# Patient Record
Sex: Male | Born: 1950
Health system: Southern US, Community
[De-identification: ages and names within clinical notes are randomized; demographics above are authoritative.]

## PROBLEM LIST (undated history)

## (undated) DIAGNOSIS — I10 Essential (primary) hypertension: Secondary | ICD-10-CM

## (undated) DIAGNOSIS — K279 Peptic ulcer, site unspecified, unspecified as acute or chronic, without hemorrhage or perforation: Secondary | ICD-10-CM

## (undated) DIAGNOSIS — F1020 Alcohol dependence, uncomplicated: Secondary | ICD-10-CM

## (undated) DIAGNOSIS — N529 Male erectile dysfunction, unspecified: Secondary | ICD-10-CM

## (undated) DIAGNOSIS — F32A Depression, unspecified: Secondary | ICD-10-CM

## (undated) DIAGNOSIS — J849 Interstitial pulmonary disease, unspecified: Secondary | ICD-10-CM

## (undated) DIAGNOSIS — F329 Major depressive disorder, single episode, unspecified: Secondary | ICD-10-CM

## (undated) DIAGNOSIS — M609 Myositis, unspecified: Secondary | ICD-10-CM

## (undated) DIAGNOSIS — E785 Hyperlipidemia, unspecified: Secondary | ICD-10-CM

## (undated) DIAGNOSIS — I2510 Atherosclerotic heart disease of native coronary artery without angina pectoris: Secondary | ICD-10-CM

## (undated) DIAGNOSIS — I77810 Thoracic aortic ectasia: Secondary | ICD-10-CM

## (undated) DIAGNOSIS — I4510 Unspecified right bundle-branch block: Secondary | ICD-10-CM

## (undated) DIAGNOSIS — I499 Cardiac arrhythmia, unspecified: Secondary | ICD-10-CM

## (undated) DIAGNOSIS — F99 Mental disorder, not otherwise specified: Secondary | ICD-10-CM

## (undated) HISTORY — DX: Interstitial pulmonary disease, unspecified: J84.9

## (undated) HISTORY — PX: CORONARY STENT PLACEMENT: SHX1402

## (undated) HISTORY — DX: Thoracic aortic ectasia: I77.810

## (undated) HISTORY — DX: Peptic ulcer, site unspecified, unspecified as acute or chronic, without hemorrhage or perforation: K27.9

## (undated) HISTORY — DX: Mental disorder, not otherwise specified: F99

## (undated) HISTORY — DX: Unspecified right bundle-branch block: I45.10

## (undated) HISTORY — DX: Male erectile dysfunction, unspecified: N52.9

## (undated) HISTORY — DX: Cardiac arrhythmia, unspecified: I49.9

## (undated) HISTORY — DX: Myositis, unspecified: M60.9

---

## 2004-05-14 ENCOUNTER — Ambulatory Visit (HOSPITAL_COMMUNITY): Admission: RE | Admit: 2004-05-14 | Discharge: 2004-05-14 | Payer: Self-pay | Admitting: Gastroenterology

## 2005-05-21 ENCOUNTER — Ambulatory Visit (HOSPITAL_COMMUNITY): Admission: RE | Admit: 2005-05-21 | Discharge: 2005-05-22 | Payer: Self-pay | Admitting: Cardiology

## 2008-06-03 ENCOUNTER — Emergency Department (HOSPITAL_COMMUNITY): Admission: EM | Admit: 2008-06-03 | Discharge: 2008-06-04 | Payer: Self-pay | Admitting: Emergency Medicine

## 2008-06-08 ENCOUNTER — Ambulatory Visit: Payer: Self-pay | Admitting: Internal Medicine

## 2008-06-08 ENCOUNTER — Inpatient Hospital Stay (HOSPITAL_COMMUNITY): Admission: EM | Admit: 2008-06-08 | Discharge: 2008-06-09 | Payer: Self-pay | Admitting: Emergency Medicine

## 2010-05-28 LAB — CBC
HCT: 44.5 % (ref 39.0–52.0)
Hemoglobin: 13.8 g/dL (ref 13.0–17.0)
Hemoglobin: 15.4 g/dL (ref 13.0–17.0)
MCHC: 34.7 g/dL (ref 30.0–36.0)
MCV: 92 fL (ref 78.0–100.0)
RBC: 4.84 MIL/uL (ref 4.22–5.81)
RDW: 13.3 % (ref 11.5–15.5)
WBC: 9.7 10*3/uL (ref 4.0–10.5)

## 2010-05-28 LAB — URINALYSIS, ROUTINE W REFLEX MICROSCOPIC
Leukocytes, UA: NEGATIVE
Protein, ur: 30 mg/dL — AB
Urobilinogen, UA: 0.2 mg/dL (ref 0.0–1.0)

## 2010-05-28 LAB — COMPREHENSIVE METABOLIC PANEL
ALT: 73 U/L — ABNORMAL HIGH (ref 0–53)
AST: 70 U/L — ABNORMAL HIGH (ref 0–37)
CO2: 28 mEq/L (ref 19–32)
Calcium: 8.1 mg/dL — ABNORMAL LOW (ref 8.4–10.5)
Chloride: 105 mEq/L (ref 96–112)
Creatinine, Ser: 0.87 mg/dL (ref 0.4–1.5)
Creatinine, Ser: 0.87 mg/dL (ref 0.4–1.5)
GFR calc Af Amer: >60 mL/min (ref >60)
GFR calc Af Amer: >60 mL/min (ref >60)
GFR calc non Af Amer: >60 mL/min (ref >60)
Glucose, Bld: 129 mg/dL — ABNORMAL HIGH (ref 70–99)
Sodium: 139 mEq/L (ref 135–145)
Total Bilirubin: 1 mg/dL (ref 0.3–1.2)
Total Protein: 6.5 g/dL (ref 6.0–8.3)

## 2010-05-28 LAB — DIFFERENTIAL
Basophils Absolute: 0 10*3/uL (ref 0.0–0.1)
Eosinophils Absolute: 0 10*3/uL (ref 0.0–0.7)
Eosinophils Absolute: 0 10*3/uL (ref 0.0–0.7)
Eosinophils Relative: 1 % (ref 0–5)
Lymphocytes Relative: 16 % (ref 12–46)
Lymphocytes Relative: 22 % (ref 12–46)
Lymphs Abs: 1.4 10*3/uL (ref 0.7–4.0)
Monocytes Relative: 6 % (ref 3–12)
Neutrophils Relative %: 71 % (ref 43–77)

## 2010-05-28 LAB — PROTIME-INR
INR: 1 (ref 0.00–1.49)
Prothrombin Time: 12.6 seconds (ref 11.6–15.2)
Prothrombin Time: 13.5 seconds (ref 11.6–15.2)

## 2010-05-28 LAB — RAPID URINE DRUG SCREEN, HOSP PERFORMED
Amphetamines: NOT DETECTED
Cocaine: NOT DETECTED
Opiates: NOT DETECTED
Tetrahydrocannabinol: NOT DETECTED

## 2010-05-28 LAB — URINE MICROSCOPIC-ADD ON

## 2010-07-01 NOTE — H&P (Signed)
Sean [name], Sean [name]                  ACCOUNT NO.:  [id number]   MEDICAL RECORD NO.:  [id number]          PATIENT TYPE:  INP   LOCATION:                               FACILITY:  [facility]   PHYSICIAN:  Gordy Savers, MDDATE OF BIRTH:  [birth year]   DATE OF ADMISSION:  06/08/2008  DATE OF DISCHARGE:  06/09/2008                              HISTORY & PHYSICAL   CHIEF COMPLAINT:  Alcohol withdrawal.   HISTORY OF PRESENT ILLNESS:  The patient is a 60 year old male with a  long history of chronic alcoholism.  He was admitted to [facility]  approximately 5 years ago.  Since that time, he has had brief periods of  abstinence but more recently has begun drinking quite heavily.  The  patient presented to the emergency department with a chief complaint of  alcohol withdrawal.  The patient was noted to have significant  tachycardia with a pulse rate between 130 and 140 and was felt  appropriate to admit for medical detoxification and stabilization prior  to transfer to an alcohol rehab facility.   PAST MEDICAL HISTORY:  The patient has a long history of chronic  alcoholism.  Additionally, he has a history of hypertension and tobacco  abuse.  He states that he has been abstinent from tobacco for 30 years.  He has a history of coronary artery disease and is status post PCI of  the LAD with a bare-metal stent in April 2007.   MEDICAL REGIMEN:  1. Simvastatin 80.  2. Plavix 75 mg daily.   FAMILY HISTORY:  Father age 69 has a history of prostate cancer.  Mother  died at 55 with complications of COPD.  One brother, 4 sisters positive  for depression.   SOCIAL HISTORY:  He is married.  Two children and 2 grandchildren.  Heavy alcohol use.  Denies tobacco use for 30 years.  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   REVIEW OF SYSTEMS:  Exam is otherwise noncontributory.  The patient  complains of anxiety, tachycardia, and diaphoresis.  He was evaluated in  the emergency department 5 days ago after a  fall sustaining facial  trauma.  This required 7 sutures to the right lower chin area.   PHYSICAL EXAMINATION:  VITAL SIGNS:  Blood pressure 140/90, pulse rate  130, respiratory rate 20, temperature 97.4, O2 saturation 96%.  SKIN:  Ecchymoses about the right facial area and chin.  HEENT:  Pupil responses were normal.  Conjunctivae clear and anicteric.  Ear, nose, and throat negative.  NECK:  No bruits or adenopathy.  CHEST:  Clear.  CARDIOVASCULAR:  Rapid regular tachycardia.  No murmur appreciated.  ABDOMEN:  Soft and nontender.  No organomegaly.  EXTERNAL GENITALIA:  Normal.  EXTREMITIES:  No edema.  Peripheral pulses were full.  There is no stigmata of chronic liver disease.   LABORATORY STUDIES:  CBC revealed a normal white count and normal  hemoglobin, MCV was normal at 92.5.  INR normal 1.0.  SGOT was elevated  at 88 with an SGPT 7, albumin normal at 3.8.  Chemistries were  unremarkable except for random blood sugar of 129.  Alcohol level on 2  occasions was 289 and 148.   IMPRESSION:  1. Acute alcohol withdrawal syndrome.  2. Coronary artery disease status post percutaneous coronary      intervention of the left anterior descending.  3. Hypertension.   DISPOSITION:  The patient will be admitted to the hospital.  He will be  placed on Ativan protocol.  When medically cleared, he will be  considered for transfer to an alcohol rehab facility.      Gordy Savers, MD  Electronically Signed     PFK/MEDQ  D:  06/08/2008  T:  06/09/2008  Job:  [id number]

## 2010-07-04 NOTE — Op Note (Signed)
NAMEAERON, Sean [name]                  ACCOUNT NO.:  [id number]   MEDICAL RECORD NO.:  [id number]          PATIENT TYPE:  AMB   LOCATION:  ENDO                         FACILITY:  [facility]   PHYSICIAN:  John C. Madilyn Fireman, M.D.    DATE OF BIRTH:  Feb 16, 1951   DATE OF PROCEDURE:  05/14/2004  DATE OF DISCHARGE:                                 OPERATIVE REPORT   INDICATIONS FOR PROCEDURE:  Average risk colon cancer screening.   PROCEDURE:  The patient was placed in the left lateral decubitus position  and placed on the pulse monitor with continuous low-flow oxygen delivered by  nasal cannula. He was sedated with 75 mcg IV fentanyl and 10 mg IV Versed.  Olympus video colonoscope was inserted into the rectum and advanced to the  cecum, confirmed by transillumination of McBurney's point and visualization  of ileocecal valve and appendiceal orifice. Prep was fairly good but  somewhat suboptimal in some areas, and I could not rule out small lesions  less 1 cm in all areas. Otherwise the cecum, ascending, transverse,  descending and sigmoid colon all appeared normal with no masses, polyps,  diverticula or other mucosal abnormalities. The rectum likewise appeared  normal. Retroflexed view of the anus revealed no obvious internal  hemorrhoids. The scope was then withdrawn, and the patient returned to the  recovery room in stable condition. He tolerated the procedure well. There  were no immediate complications.   IMPRESSION:  Normal colonoscopy.   PLAN:  Colonoscopy within 10 years and consider flexible sigmoidoscopy or  Hemoccults in 5 years.      JCH/MEDQ  D:  05/14/2004  T:  05/14/2004  Job:  [id number]   cc:   Oley Balm. Georgina Pillion, M.D.  [address]  [place]  [place] [address]  Fax: [phone]

## 2010-07-04 NOTE — Cardiovascular Report (Signed)
Sean [name], Sean [name]                  ACCOUNT NO.:  [id number]   MEDICAL RECORD NO.:  [id number]          PATIENT TYPE:  OIB   LOCATION:  2899                         FACILITY:  [facility]   PHYSICIAN:  Armanda Magic, M.D.     DATE OF BIRTH:  1950/11/23   DATE OF PROCEDURE:  05/21/2005  DATE OF DISCHARGE:                              CARDIAC CATHETERIZATION   REFERRING PHYSICIAN:  Oley Balm. Georgina Pillion, M.D.   PROCEDURE:  Left heart catheterization, coronary angiography, left  ventriculography.   OPERATOR:  Armanda Magic, M.D.   INDICATIONS:  Chest pain, abnormal EKG.   COMPLICATIONS:  None.   IV ACCESS:  The right femoral artery 6-French sheath.   IV CONTRAST:  80 mL.   This is a very pleasant 60 year old white male who presented to my office  several days ago with complaints of episodic chest pain occurring over the  past 2 months, but has increased in frequency and severity recently.  He  does have a history of hypertension, tobacco abuse and alcoholism.  He now  presents for cardiac catheterization due to abnormal EKG with T-wave  inversions in the precordial leads and chest pain.   The patient is brought to the cardiac catheterization laboratory in a  fasting nonsedated state.  Informed consent was obtained.  The patient was  connected to continuous heart rate and pulse oximetry monitoring,  intermittent blood pressure monitoring.  The right groin was prepped and  draped in sterile fashion.  1% Xylocaine was used for local anesthesia.  Using modified Seldinger technique a 6-French sheath was placed in the right  femoral artery.  Under fluoroscopic guidance a 6-French JL-4 catheter was  placed in the left coronary artery.  Multiple cine films were taken at 30  degree RAO and 40 degree LAO views.  The catheter was then exchanged out  over a guidewire for 6-French JR-4 catheter which was placed under  fluoroscopic guidance in the right coronary artery.  Multiple cine films  were taken  in the 30 degree RAO and 40 degree LAO views.  This catheter was  then exchanged out over a guidewire for 6-French angled pigtail catheter  which was placed under fluoroscopic guidance in the left ventricular cavity.  Left ventriculography was performed in the 30 degree RAO view using a total  of 30 mL of contrast at 15 mL per second.  Catheter was then pulled back  across the aortic valve with no significant gradient noted.  At the end of  the procedure all catheters and sheaths were removed.  Manual compression  was performed until adequate hemostasis was obtained.  The patient was  transferred back to the his room in stable condition.   RESULTS:  1.  Left main coronary is widely patent and bifurcates into left anterior      descending artery and left circumflex artery.  2.  The left anterior descending artery has an ostial 20% narrowing and      gives rise to a large diagonal branch which is widely patent and      bifurcates distally in two  daughter branches.  Just distal to the      takeoff of the first diagonal branch there is a 30-40% narrowing of the      LAD, then there is a very large aneurysmal section.  Then there is a      concentric 95% lesion in the mid LAD.  The rest of the LAD is widely      patent.  There is sluggish TIMI II flow in the distal LAD.  3.  The left circumflex gives rise to a very small obtuse marginal one      branch which is patent.  The proximal to mid left circumflex follows in      the AV groove and has a 30% narrowing before the takeoff of a larger      second obtuse marginal branch which is widely patent.  The ongoing      circumflex gives rise to a third obtuse marginal branch which is widely      patent.  4.  The right coronary artery is widely patent throughout its course with      luminal irregularities, distally it bifurcates into posterior descending      artery and posterolateral artery, both of which are widely patent.  5.  Left  ventriculography shows normal LV systolic function.  LV pressure      116/6 mmHg, aortic pressure 114/71 mmHg.   ASSESSMENT:  1.  Angina.  2.  One-vessel obstructive coronary disease of the left anterior descending.  3.  Normal LV function.  4.  Hypertension.  5.  Tobacco and alcohol abuse.   PLAN:  PCI of the LAD per Dr. Eldridge Dace, aspirin and Plavix.  Check a fasting  lipid panel and I have discussed with the patient the need to quit smoking.      Armanda Magic, M.D.  Electronically Signed     TT/MEDQ  D:  05/21/2005  T:  05/22/2005  Job:  [id number]   cc:   Oley Balm. Georgina Pillion, M.D.  Fax: [phone]

## 2010-07-04 NOTE — Cardiovascular Report (Signed)
Sean [name], BOEHRINGER                  ACCOUNT NO.:  [id number]   MEDICAL RECORD NO.:  [id number]          PATIENT TYPE:  OIB   LOCATION:  6522                         FACILITY:  [facility]   PHYSICIAN:  Corky Crafts, MDDATE OF BIRTH:  Jun 21, 1950   DATE OF PROCEDURE:  05/21/2005  DATE OF DISCHARGE:  05/22/2005                              CARDIAC CATHETERIZATION   REFERRING PHYSICIAN:  Armanda Magic, M.D.   PROCEDURE PERFORMED:  PCI of the LAD.   OPERATOR:  Corky Crafts, M.D.   INDICATIONS:  Chest pain.   PROCEDURAL NARRATIVE:  A diagnostic catheterization was performed by Dr.  Mayford Knife which revealed a significant mid LAD lesion after a large aneurysmal  segment.  The decision was made to proceed with angioplasty.  A CLS 3.5  guiding catheter was advanced to the ascending aorta and placed in the  ostium of the left main under fluoroscopic guidance.  A Prowater wire was  advanced across the lesion.  Heparin  and Integrilin were used as  anticoagulation.  A 2.5 x 12-mm Maverick balloon was advanced to the lesion  and inflated to 10 atmospheres for 24 seconds.  A 3-0 x 15-mm Vision stent  was then advanced to the lesion.  The proximal end of the stent was placed  just at the distal edge of the aneurysm.  The stent was inflated to 12  atmospheres for 30 seconds.  The stent was then post dilated with a 3.25 x  13-mm PowerSail balloon, initially inflated to 14 atmospheres in the more  distal portion of the stent for 35 seconds, and then to 14 atmospheres for  30 seconds in the proximal edge of the stent.  There was likely some balloon  hanging out into the aneurysm outside of the stent during the second balloon  inflation.  There was an excellent angiographic result.  There is TIMI III  flow noted in the vessel.  The patient tolerated procedure well.  There were  no apparent complications.   IMPRESSION:  Successful percutaneous coronary intervention of the left  anterior descending  artery.  The initial 99% stenosis was reduced to zero  percent.  The patient had TIMI III flow at the end of the procedure   RECOMMENDATIONS:  1.  The patient will be monitored overnight in the hospital.  2.  He will o receive integument IV for 18 hours.  3.  Continue aspirin 325 mg p.o. every day and Plavix 75 mg p.o. every day      indefinitely.  4.  A bare metal stent was used because of concerns with the aneurysm.  The      aneurysm is large and the patient may eventually require Coumadin      therapy, given the sluggish flow noted within the aneurysm.  Given that      there may be some stent hanging within the aneurysm, this will likely be      better endothelialized if a bare metal stent was used.  5.  The patient will follow up with Dr. Mayford Knife.      Renelda Loma  Hoyle Barr, MD  Electronically Signed     JSV/MEDQ  D:  05/21/2005  T:  05/22/2005  Job:  [id number]

## 2011-02-27 ENCOUNTER — Encounter (INDEPENDENT_AMBULATORY_CARE_PROVIDER_SITE_OTHER): Payer: 59 | Admitting: Ophthalmology

## 2011-02-27 DIAGNOSIS — H43819 Vitreous degeneration, unspecified eye: Secondary | ICD-10-CM

## 2011-02-27 DIAGNOSIS — H4310 Vitreous hemorrhage, unspecified eye: Secondary | ICD-10-CM

## 2011-02-27 DIAGNOSIS — H2510 Age-related nuclear cataract, unspecified eye: Secondary | ICD-10-CM

## 2011-04-03 ENCOUNTER — Encounter (INDEPENDENT_AMBULATORY_CARE_PROVIDER_SITE_OTHER): Payer: 59 | Admitting: Ophthalmology

## 2011-04-03 DIAGNOSIS — H2510 Age-related nuclear cataract, unspecified eye: Secondary | ICD-10-CM

## 2011-04-03 DIAGNOSIS — H35439 Paving stone degeneration of retina, unspecified eye: Secondary | ICD-10-CM

## 2011-04-03 DIAGNOSIS — H43819 Vitreous degeneration, unspecified eye: Secondary | ICD-10-CM

## 2011-04-03 DIAGNOSIS — H4310 Vitreous hemorrhage, unspecified eye: Secondary | ICD-10-CM

## 2011-04-03 DIAGNOSIS — H3530 Unspecified macular degeneration: Secondary | ICD-10-CM

## 2011-04-29 ENCOUNTER — Emergency Department (HOSPITAL_COMMUNITY): Payer: 59

## 2011-04-29 ENCOUNTER — Inpatient Hospital Stay (HOSPITAL_COMMUNITY)
Admission: EM | Admit: 2011-04-29 | Discharge: 2011-05-05 | DRG: 897 | Disposition: A | Discharge status: Other Acute Inpatient Hospital | Payer: 59 | Attending: Internal Medicine | Admitting: Internal Medicine

## 2011-04-29 ENCOUNTER — Encounter (HOSPITAL_COMMUNITY): Payer: Self-pay

## 2011-04-29 DIAGNOSIS — F3289 Other specified depressive episodes: Secondary | ICD-10-CM | POA: Diagnosis present

## 2011-04-29 DIAGNOSIS — Z9861 Coronary angioplasty status: Secondary | ICD-10-CM

## 2011-04-29 DIAGNOSIS — I2510 Atherosclerotic heart disease of native coronary artery without angina pectoris: Secondary | ICD-10-CM | POA: Diagnosis present

## 2011-04-29 DIAGNOSIS — F10939 Alcohol use, unspecified with withdrawal, unspecified: Principal | ICD-10-CM | POA: Diagnosis present

## 2011-04-29 DIAGNOSIS — R32 Unspecified urinary incontinence: Secondary | ICD-10-CM | POA: Diagnosis present

## 2011-04-29 DIAGNOSIS — Z7982 Long term (current) use of aspirin: Secondary | ICD-10-CM

## 2011-04-29 DIAGNOSIS — R7989 Other specified abnormal findings of blood chemistry: Secondary | ICD-10-CM | POA: Diagnosis present

## 2011-04-29 DIAGNOSIS — F10239 Alcohol dependence with withdrawal, unspecified: Principal | ICD-10-CM | POA: Diagnosis present

## 2011-04-29 DIAGNOSIS — Z79899 Other long term (current) drug therapy: Secondary | ICD-10-CM

## 2011-04-29 DIAGNOSIS — I498 Other specified cardiac arrhythmias: Secondary | ICD-10-CM | POA: Diagnosis present

## 2011-04-29 DIAGNOSIS — F329 Major depressive disorder, single episode, unspecified: Secondary | ICD-10-CM | POA: Diagnosis present

## 2011-04-29 DIAGNOSIS — E785 Hyperlipidemia, unspecified: Secondary | ICD-10-CM | POA: Diagnosis present

## 2011-04-29 DIAGNOSIS — Z7902 Long term (current) use of antithrombotics/antiplatelets: Secondary | ICD-10-CM

## 2011-04-29 DIAGNOSIS — D696 Thrombocytopenia, unspecified: Secondary | ICD-10-CM | POA: Diagnosis present

## 2011-04-29 DIAGNOSIS — F1020 Alcohol dependence, uncomplicated: Secondary | ICD-10-CM | POA: Diagnosis present

## 2011-04-29 DIAGNOSIS — I10 Essential (primary) hypertension: Secondary | ICD-10-CM

## 2011-04-29 HISTORY — DX: Atherosclerotic heart disease of native coronary artery without angina pectoris: I25.10

## 2011-04-29 HISTORY — DX: Alcohol dependence, uncomplicated: F10.20

## 2011-04-29 HISTORY — DX: Essential (primary) hypertension: I10

## 2011-04-29 HISTORY — DX: Hyperlipidemia, unspecified: E78.5

## 2011-04-29 LAB — BASIC METABOLIC PANEL
CO2: 31 mEq/L (ref 19–32)
Calcium: 8.3 mg/dL — ABNORMAL LOW (ref 8.4–10.5)
Chloride: 91 mEq/L — ABNORMAL LOW (ref 96–112)
Glucose, Bld: 99 mg/dL (ref 70–99)
Potassium: 3.1 mEq/L — ABNORMAL LOW (ref 3.5–5.1)
Sodium: 134 mEq/L — ABNORMAL LOW (ref 135–145)

## 2011-04-29 LAB — URINALYSIS, ROUTINE W REFLEX MICROSCOPIC
Bilirubin Urine: NEGATIVE
Glucose, UA: NEGATIVE mg/dL
Ketones, ur: NEGATIVE mg/dL
Protein, ur: 100 mg/dL — AB
Urobilinogen, UA: 1 mg/dL (ref 0.0–1.0)

## 2011-04-29 LAB — ETHANOL: Alcohol, Ethyl (B): 113 mg/dL — ABNORMAL HIGH (ref 0–11)

## 2011-04-29 LAB — HEPATIC FUNCTION PANEL
Albumin: 2.9 g/dL — ABNORMAL LOW (ref 3.5–5.2)
Alkaline Phosphatase: 141 U/L — ABNORMAL HIGH (ref 39–117)
Total Bilirubin: 0.5 mg/dL (ref 0.3–1.2)

## 2011-04-29 LAB — DIFFERENTIAL
Eosinophils Relative: 0 % (ref 0–5)
Lymphocytes Relative: 20 % (ref 12–46)
Lymphs Abs: 0.3 10*3/uL — ABNORMAL LOW (ref 0.7–4.0)
Monocytes Absolute: 0.2 10*3/uL (ref 0.1–1.0)
Monocytes Relative: 11 % (ref 3–12)
Neutro Abs: 1 10*3/uL — ABNORMAL LOW (ref 1.7–7.7)

## 2011-04-29 LAB — RAPID URINE DRUG SCREEN, HOSP PERFORMED
Amphetamines: NOT DETECTED
Benzodiazepines: NOT DETECTED
Cocaine: NOT DETECTED
Opiates: NOT DETECTED
Tetrahydrocannabinol: NOT DETECTED

## 2011-04-29 LAB — URINE MICROSCOPIC-ADD ON

## 2011-04-29 LAB — CBC
HCT: 40 % (ref 39.0–52.0)
Hemoglobin: 13.8 g/dL (ref 13.0–17.0)
MCV: 93.2 fL (ref 78.0–100.0)
RBC: 4.29 MIL/uL (ref 4.22–5.81)
RDW: 13.5 % (ref 11.5–15.5)
WBC: 1.5 10*3/uL — ABNORMAL LOW (ref 4.0–10.5)

## 2011-04-29 LAB — MAGNESIUM: Magnesium: 2.1 mg/dL (ref 1.5–2.5)

## 2011-04-29 MED ORDER — LORAZEPAM 1 MG PO TABS
1.0000 mg | ORAL_TABLET | Freq: Four times a day (QID) | ORAL | Status: DC | PRN
  Filled 2011-04-29 (×2): qty 1

## 2011-04-29 MED ORDER — ENOXAPARIN SODIUM 30 MG/0.3ML ~~LOC~~ SOLN
30.0000 mg | SUBCUTANEOUS | Status: DC
  Administered 2011-04-29 – 2011-05-04 (×6): 30 mg via SUBCUTANEOUS
  Filled 2011-04-29 (×7): qty 0.3

## 2011-04-29 MED ORDER — ALUM & MAG HYDROXIDE-SIMETH 200-200-20 MG/5ML PO SUSP: 30.0000 mL | ORAL | Status: DC | PRN

## 2011-04-29 MED ORDER — PANTOPRAZOLE SODIUM 40 MG PO TBEC
40.0000 mg | DELAYED_RELEASE_TABLET | Freq: Every day | ORAL | Status: DC
  Administered 2011-04-29 – 2011-05-05 (×7): 40 mg via ORAL
  Filled 2011-04-29 (×8): qty 1

## 2011-04-29 MED ORDER — THIAMINE HCL 100 MG/ML IJ SOLN: 100.0000 mg | Freq: Every day | INTRAMUSCULAR | Status: DC

## 2011-04-29 MED ORDER — LORAZEPAM 0.5 MG PO TABS
0.0000 mg | ORAL_TABLET | Freq: Four times a day (QID) | ORAL | Status: AC
  Administered 2011-04-29 – 2011-05-01 (×6): 1 mg via ORAL
  Filled 2011-04-29 (×7): qty 2

## 2011-04-29 MED ORDER — VITAMIN B-1 100 MG PO TABS
100.0000 mg | ORAL_TABLET | Freq: Every day | ORAL | Status: DC
  Administered 2011-04-29 – 2011-05-05 (×7): 100 mg via ORAL
  Filled 2011-04-29 (×7): qty 1

## 2011-04-29 MED ORDER — POTASSIUM CHLORIDE CRYS ER 20 MEQ PO TBCR
40.0000 meq | EXTENDED_RELEASE_TABLET | Freq: Once | ORAL | Status: AC
  Administered 2011-04-29: 40 meq via ORAL
  Filled 2011-04-29: qty 2

## 2011-04-29 MED ORDER — ACETAMINOPHEN 325 MG PO TABS
650.0000 mg | ORAL_TABLET | Freq: Once | ORAL | Status: DC
  Filled 2011-04-29: qty 2

## 2011-04-29 MED ORDER — LORAZEPAM 0.5 MG PO TABS
1.0000 mg | ORAL_TABLET | Freq: Four times a day (QID) | ORAL | Status: AC | PRN
  Administered 2011-04-30 – 2011-05-02 (×7): 1 mg via ORAL
  Filled 2011-04-29 (×7): qty 2

## 2011-04-29 MED ORDER — LORAZEPAM 0.5 MG PO TABS: 1.0000 mg | ORAL_TABLET | Freq: Four times a day (QID) | ORAL | Status: DC

## 2011-04-29 MED ORDER — ONDANSETRON HCL 4 MG PO TABS
4.0000 mg | ORAL_TABLET | Freq: Three times a day (TID) | ORAL | Status: DC | PRN
  Administered 2011-04-29: 10:00:00 via ORAL
  Filled 2011-04-29: qty 1

## 2011-04-29 MED ORDER — CLOPIDOGREL BISULFATE 75 MG PO TABS
75.0000 mg | ORAL_TABLET | Freq: Every day | ORAL | Status: DC
  Administered 2011-04-29 – 2011-05-05 (×7): 75 mg via ORAL
  Filled 2011-04-29 (×7): qty 1

## 2011-04-29 MED ORDER — LORAZEPAM 1 MG PO TABS
2.0000 mg | ORAL_TABLET | Freq: Once | ORAL | Status: AC
  Administered 2011-04-29: 2 mg via ORAL

## 2011-04-29 MED ORDER — SODIUM CHLORIDE 0.9 % IV BOLUS (SEPSIS)
1000.0000 mL | Freq: Once | INTRAVENOUS | Status: AC
  Administered 2011-04-29: 1000 mL via INTRAVENOUS

## 2011-04-29 MED ORDER — VITAMIN B-1 100 MG PO TABS
100.0000 mg | ORAL_TABLET | Freq: Every day | ORAL | Status: DC
  Administered 2011-04-29: 100 mg via ORAL
  Filled 2011-04-29 (×2): qty 1

## 2011-04-29 MED ORDER — LORAZEPAM 0.5 MG PO TABS
0.0000 mg | ORAL_TABLET | Freq: Two times a day (BID) | ORAL | Status: AC
  Administered 2011-05-01 – 2011-05-03 (×4): 1 mg via ORAL
  Filled 2011-04-29 (×3): qty 2

## 2011-04-29 MED ORDER — LORAZEPAM 2 MG/ML IJ SOLN: 2.0000 mg | Freq: Once | INTRAMUSCULAR | Status: AC

## 2011-04-29 MED ORDER — IBUPROFEN 600 MG PO TABS
600.0000 mg | ORAL_TABLET | Freq: Three times a day (TID) | ORAL | Status: DC | PRN
  Administered 2011-04-29: 600 mg via ORAL
  Filled 2011-04-29: qty 1
  Filled 2011-04-29: qty 3

## 2011-04-29 MED ORDER — LORAZEPAM 2 MG/ML IJ SOLN: 1.0000 mg | Freq: Four times a day (QID) | INTRAMUSCULAR | Status: DC | PRN

## 2011-04-29 MED ORDER — ATORVASTATIN CALCIUM 40 MG PO TABS
40.0000 mg | ORAL_TABLET | Freq: Every day | ORAL | Status: DC
  Administered 2011-04-29 – 2011-05-05 (×7): 40 mg via ORAL
  Filled 2011-04-29 (×7): qty 1

## 2011-04-29 MED ORDER — ADULT MULTIVITAMIN W/MINERALS CH
1.0000 | ORAL_TABLET | Freq: Every day | ORAL | Status: DC
  Administered 2011-04-29 – 2011-05-05 (×7): 1 via ORAL
  Filled 2011-04-29 (×7): qty 1

## 2011-04-29 MED ORDER — ASPIRIN EC 325 MG PO TBEC
325.0000 mg | DELAYED_RELEASE_TABLET | Freq: Every day | ORAL | Status: DC
Start: 2011-04-29 — End: 2011-05-01
  Administered 2011-04-29 – 2011-04-30 (×2): 325 mg via ORAL
  Filled 2011-04-29 (×3): qty 1

## 2011-04-29 MED ORDER — MORPHINE SULFATE 2 MG/ML IJ SOLN: 2.0000 mg | INTRAMUSCULAR | Status: DC | PRN

## 2011-04-29 MED ORDER — SODIUM CHLORIDE 0.9 % IV SOLN
INTRAVENOUS | Status: DC
  Administered 2011-04-29 – 2011-04-30 (×2): via INTRAVENOUS

## 2011-04-29 MED ORDER — THIAMINE HCL 100 MG/ML IJ SOLN
100.0000 mg | Freq: Every day | INTRAMUSCULAR | Status: DC
  Filled 2011-04-29 (×7): qty 1

## 2011-04-29 MED ORDER — ADULT MULTIVITAMIN W/MINERALS CH
1.0000 | ORAL_TABLET | Freq: Every day | ORAL | Status: DC
  Administered 2011-04-29: 1 via ORAL
  Filled 2011-04-29 (×2): qty 1

## 2011-04-29 MED ORDER — FOLIC ACID 1 MG PO TABS: 1.0000 mg | ORAL_TABLET | Freq: Every day | ORAL | Status: DC

## 2011-04-29 MED ORDER — LORAZEPAM 2 MG/ML IJ SOLN: 1.0000 mg | Freq: Four times a day (QID) | INTRAMUSCULAR | Status: AC | PRN

## 2011-04-29 MED ORDER — LORAZEPAM 0.5 MG PO TABS
2.0000 mg | ORAL_TABLET | Freq: Four times a day (QID) | ORAL | Status: DC | PRN
  Filled 2011-04-29: qty 1

## 2011-04-29 MED ORDER — FOLIC ACID 1 MG PO TABS
1.0000 mg | ORAL_TABLET | Freq: Every day | ORAL | Status: DC
  Administered 2011-04-29 – 2011-05-05 (×7): 1 mg via ORAL
  Filled 2011-04-29 (×8): qty 1

## 2011-04-29 NOTE — ED Notes (Signed)
[id number] Ready 

## 2011-04-29 NOTE — H&P (Signed)
Hospital Admission Note Date: 04/29/2011  Patient name: Sean [name] Medical record number: [id number] Date of birth: [birth year] Age: 61 y.o. Gender: male PCP: None   Medical Service:  Mervyn Gay  Attending physician:  Dr. Lina Sayre   1st Contact:   Dr. [name]  Pager:  [phone] 2nd Contact:   Dr. Allena Katz  Pager:  [phone] After 5 pm or weekends: 1st Contact:      Pager: [phone] 2nd Contact:      Pager: [phone]  Chief Complaint: Alcohol Withdrawal / Detox  History of Present Illness:  Sean [name] is a 61 yo man with history of heavy alcohol use for years, HTN, HLD, and CAD s/p PCI placement in 2007 who presents for alcohol detox and alcohol withdrawal.  Sean [name] has been drinking one bottle of vodka per day or 18-24 beers per day for the past two months.  His last drink was at midnight last night.  Sean [name] reports sweats, anxiety, and shakes now.    Sean [name] has detoxed in the past, including one time years ago when Sean [name] went to [facility] in [place].  Sean [name] has since quit drinking a couple times and relapsed.  Sean [name] usually goes to [facility] and has AA friends but Sean [name] has not gone in two months.  Sean [name] has gone into withdrawal "countless times" before, but Sean [name] has never had seizures.  No history of GI bleeds.  No known history of liver disease.    His diet has been poor these last two months as most of his calories have come from liquor/beer.  Sean [name] has had a lot of diarrhea over this time, 3-4 episodes per day.    No CP, SOB, urinary symptoms, nausea, vomiting, abdominal pain, or numbness/tingling anywhere.    Meds: Medication List  As of 04/29/2011  4:06 PM   ASK your doctor about these medications         aspirin 325 MG tablet   Take 81 mg by mouth daily.      clopidogrel 75 MG tablet   Commonly known as: PLAVIX   Take 75 mg by mouth daily.      mulitivitamin with minerals Tabs   Take 1 tablet by mouth daily.      simvastatin 80 MG tablet   Commonly known as: ZOCOR   Take 80 mg by mouth  daily.      vitamin C 500 MG tablet   Commonly known as: ASCORBIC ACID   Take 500 mg by mouth daily.            Allergies: Review of patient's allergies indicates no known allergies. Past Medical History  Diagnosis Date  . Coronary artery disease     95% mid LAD s/p BMS of LAD, other non critical obstruction, put on plavix and aspirin 325  . HTN (hypertension)   . HLD (hyperlipidemia)   . Alcohol addiction     2010- admitted for withdrawal , detox admission after that   Past Surgical History  Procedure Date  . Coronary stent placement    No family history on file. Mother drank alcohol heavily.  Father was a social drinker.  Daughter Sean [name] says is borderline trouble drinker.   History   Social History  . Marital Status: Married    Spouse Name: N/A    Number of Children: N/A  . Years of Education: N/A   Occupational History  . Not on file.   Social History Main Topics  . Smoking status: Never Smoker   .  Smokeless tobacco: Not on file  . Alcohol Use: Yes  . Drug Use: No  . Sexually Active:    Other Topics Concern  . Not on file   Social History Narrative   Live with wife in [place], is an Art gallery manager, currently driving trucks for a local firm. Quit smoking 1990 after smoking for 20 years. Drinks heavilyu (1 bottle of vodka or 18-24 beers a day). Used to smoke marijuana but quit in 1990s. Has united healthcare insurance. Goes to [facility] for alcohol cessation  In process of separation from his wife Drives trucks, used to be an Art gallery manager for [employer].  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  Used to smoke marijuana a lot many years ago.  No drug use currently.  Smoked for 20 years until quitting in 1990.    Review of Systems: See HPI  Physical Exam: Blood pressure 165/96, pulse 118, temperature 99.2 F (37.3 C), temperature source Oral, resp. rate 20, height 5\' 10"  (1.778 m), weight 190 lb (86.183 kg), SpO2  96.00%.   General: alert, well-developed, and cooperative to examination. Anxious. Head: normocephalic and atraumatic.  Eyes: vision grossly intact, pupils equal, pupils round, pupils reactive to light, no injection and anicteric.  Mouth: pharynx pink and moist, no erythema, and no exudates.  Neck: no JVD, and no carotid bruits.  Lungs: normal respiratory effort, no accessory muscle use, normal breath sounds, no crackles, and no wheezes. Heart: normal rate, regular rhythm, no murmur, no gallop, and no rub.  Abdomen: soft, non-tender, normal bowel sounds, no distention, no guarding, no rebound tenderness, no hepatomegaly Msk: no joint swelling, no joint warmth, and no redness over joints.  Pulses: 2+ DP/PT pulses bilaterally Extremities: No cyanosis, clubbing, edema  Neurologic: alert & oriented X3, cranial nerves II-XII intact, strength normal in all extremities, sensation intact to light touch.  Action tremor in all 4 extremities and tongue.    Skin: turgor normal and no rashes.        Lab results: Basic Metabolic Panel:  [facility] 04/29/11 0856  NA 134*  K 3.1*  CL 91*  CO2 31  GLUCOSE 99  BUN 10  CREATININE 0.96  CALCIUM 8.3*  MG --  PHOS --   CBC:  Basename 04/29/11 0856  WBC 1.5*  NEUTROABS 1.0*  HGB 13.8  HCT 40.0  MCV 93.2  PLT 88*   Urine Drug Screen: Drugs of Abuse     Component Value Date/Time   LABOPIA NONE DETECTED 04/29/2011 0901   COCAINSCRNUR NONE DETECTED 04/29/2011 0901   LABBENZ NONE DETECTED 04/29/2011 0901   AMPHETMU NONE DETECTED 04/29/2011 0901   THCU NONE DETECTED 04/29/2011 0901   LABBARB NONE DETECTED 04/29/2011 0901    Alcohol Level:  Basename 04/29/11 0856  ETH 113*   Urinalysis:  Basename 04/29/11 0901  COLORURINE YELLOW  LABSPEC 1.010  PHURINE 7.0  GLUCOSEU NEGATIVE  HGBUR MODERATE*  BILIRUBINUR NEGATIVE  KETONESUR NEGATIVE  PROTEINUR 100*  UROBILINOGEN 1.0  NITRITE NEGATIVE  LEUKOCYTESUR NEGATIVE    Imaging  results:  Dg Chest 2 View  04/29/2011  *RADIOLOGY REPORT*  Clinical Data: Short of breath and weakness.  CHEST - 2 VIEW  Comparison: None.  Findings: Heart size is normal.  Mediastinal shadows are normal. The vascularity is normal.  Lungs are clear.  No effusions. Ordinary degenerative changes effect the spine.  IMPRESSION: No active cardiopulmonary disease.  Original Report Authenticated By: Thomasenia Sales, M.D.    Other  results: EKG: pending  Assessment & Plan by Problem: Active Problems:  Alcohol withdrawal: Tremulous, tachycardic, hypertensive, all likely due to his alcohol withdrawal.  Has been drinking very heavily for the past two months.  Will admit him to stabilize his withdrawal and attempt to find a detox facility for disposition.    -Ativan: CIWA Protocol plus scheduled Ativan 1mg  PO Q6H -Thiamine and folic acid already given in ED, continue these daily -Hepatic Function Panel, Magnesium, Phosphorus -Zofran PRN -Protonix scheduled, Maalox PRN for heartburn -Morphine PRN for pain   CAD, History of PCI: 2007 Cath showed 95% mid LAD stenosis, stented with baremetal stent has been on Plavix since then.  Also taking "1/3 of a full dose aspirin" at home.  Simvastatin at home as well.  12 lead EKG to make sure Sean [name] does not have demand ischemia in the midst of his withdrawal ASA 81mg  daily Continue Plavix daily Contiue Statin   HTN (hypertension): On no medication as outpatient.  BPs 160s/90s-100s here, likely due to alcohol withdrawal.  Will treat withdrawal with ativan and see how BP responds before starting any medication for BP.  Clonidine would be a good choice in the acute withdrawal setting if pressures get too high.   HLD (hyperlipidemia): Lipid panel.  Will continue statin.  DVT Prophylaxis: Lovenox   SignedYaakov Guthrie, [name] 04/29/2011, 3:55 PM

## 2011-04-29 NOTE — ED Notes (Signed)
Informed patient and/or family of status.  

## 2011-04-29 NOTE — ED Notes (Signed)
Pt reports has been battling alcoholism for many years. Last went to [facility] 8-9 yrs ago. Has relapsed many times & has never been sober for longer than 2 yrs. States started back drinking last summer but "had it under control". States past 2 months has not been working & drinking has increased. Drinks average 12 beers & bottle of wine daily or will drink a fifth vodka daily. Last drink yesterday at 2100. Denies any other drug use. States his wife "has had it with him. I've put her through so much over the years. She has told me to get out. She's started the process for a legal separation". Reports has been "fantasizing" about suicide & how he would do it. But denies that he would ever act on it. Pt presently calm & cooperative

## 2011-04-29 NOTE — ED Notes (Signed)
Patient transported to X-ray 

## 2011-04-29 NOTE — ED Notes (Signed)
Dinner tray delivered.

## 2011-04-29 NOTE — BH Assessment (Signed)
Assessment Note   Sean [name] is an 61 y.o. male that presented to [facility] ED for detox at the request of a walk-in clinic he visited for Librium.  Pt reports drinking up to 1/5th of Vodka, or an 18 pack of beer and 3-4 bottles of wine QD for two months since being out of work for "eye problems" of which he has recently been cleared.  Pt admits "just sitting home for two months and drinking all day every day."  Pt denies any additional use.  Pt has had up to two years of sobriety in 2004 and several months in 2010 plus AA support.  Pt denies current care by MD, or any psychiatric history, other than the inpatient treatment history.  Pt is tearful and also voices passive SI, "I just want all of this to be over with."  Pt's wife of 40 years will leave him if he does not quit and he will lose his job, as well.  Pt voices current tremors, shakiness, numbness and headache, nausea, with a history of DT's hopelessness and helplessness.  Pt voices willingness and readiness to "do anything to change."  Pt will be given Ativan in ED to treat current withdrawals.  Please run for possible inpatient treatment of SA.    Axis I: Alcohol Dependence Axis II: Deferred Axis III:  Past Medical History  Diagnosis Date  . Coronary artery disease    Axis IV: other psychosocial or environmental problems and problems related to social environment Axis V: 21-30 behavior considerably influenced by delusions or hallucinations OR serious impairment in judgment, communication OR inability to function in almost all areas  Past Medical History:  Past Medical History  Diagnosis Date  . Coronary artery disease     Past Surgical History  Procedure Date  . Coronary stent placement     Family History: No family history on file.  Social History:  reports that he has never smoked. He does not have any smokeless tobacco history on file. He reports that he drinks alcohol. He reports that he does not use illicit  drugs.  Additional Social History:  Alcohol / Drug Use Pain Medications: no Prescriptions: no Over the Counter: no History of alcohol / drug use?: Yes Longest period of sobriety (when/how long): 2 years Negative Consequences of Use: Financial;Personal relationships;Work / School Withdrawal Symptoms: Fever / Chills;Sweats;Tremors;Patient aware of relationship between substance abuse and physical/medical complications;Tingling Substance #1 Name of Substance 1: ETOH 1 - Age of First Use: 14 1 - Amount (size/oz): up to 1/5th of Vodka, 18 pack of beer and 3-4 bottles of wine QD 1 - Frequency: QD 1 - Duration: 2 months 1 - Last Use / Amount: last night 3/12 Allergies: No Known Allergies  Home Medications:  Medications Prior to Admission  Medication Dose Route Frequency Provider Last Rate Last Dose  . alum & mag hydroxide-simeth (MAALOX/MYLANTA) 200-200-20 MG/5ML suspension 30 mL  30 mL Oral PRN Otilio Miu, PA      . folic acid (FOLVITE) tablet 1 mg  1 mg Oral Daily Catherine E Schinlever, PA      . ibuprofen (ADVIL,MOTRIN) tablet 600 mg  600 mg Oral Q8H PRN Otilio Miu, PA      . LORazepam (ATIVAN) tablet 1 mg  1 mg Oral Q6H PRN Otilio Miu, PA       Or  . LORazepam (ATIVAN) injection 1 mg  1 mg Intravenous Q6H PRN Otilio Miu, PA      .  mulitivitamin with minerals tablet 1 tablet  1 tablet Oral Daily Arie Sabina Schinlever, PA      . ondansetron (ZOFRAN) tablet 4 mg  4 mg Oral Q8H PRN Otilio Miu, PA      . thiamine (VITAMIN B-1) tablet 100 mg  100 mg Oral Daily Otilio Miu, PA       Or  . thiamine (B-1) injection 100 mg  100 mg Intravenous Daily Otilio Miu, PA       No current outpatient prescriptions on file as of 04/29/2011.    OB/GYN Status:  No LMP for male patient.  General Assessment Data Location of Assessment: [facility] ED Living Arrangements: Spouse/significant other Can pt return to current living  arrangement?: Yes Admission Status: Voluntary Is patient capable of signing voluntary admission?: Yes Transfer from: Acute Hospital Referral Source: MD  Education Status Is patient currently in school?: No  Risk to self Suicidal Ideation: Yes-Currently Present Suicidal Intent: No Is patient at risk for suicide?: Yes Suicidal Plan?: No Access to Means: Yes Specify Access to Suicidal Means: alcohol and pills available What has been your use of drugs/alcohol within the last 12 months?: vodka, wine, and beer Previous Attempts/Gestures: No How many times?: 0  Other Self Harm Risks: binging Triggers for Past Attempts: None known Intentional Self Injurious Behavior: Damaging Comment - Self Injurious Behavior: binging Family Suicide History: No Recent stressful life event(s): Loss (Comment);Recent negative physical changes;Turmoil (Comment) Persecutory voices/beliefs?: No Depression: Yes Depression Symptoms: Despondent;Fatigue;Guilt;Loss of interest in usual pleasures;Feeling worthless/self pity Substance abuse history and/or treatment for substance abuse?: Yes Suicide prevention information given to non-admitted patients: Not applicable  Risk to Others Homicidal Ideation: No Thoughts of Harm to Others: No Current Homicidal Intent: No Current Homicidal Plan: No Access to Homicidal Means: No Identified Victim: n/a History of harm to others?: No Assessment of Violence: None Noted Does patient have access to weapons?: No Criminal Charges Pending?: No Does patient have a court date: No  Psychosis Hallucinations: None noted Delusions: None noted  Mental Status Report Eye Contact: Good Motor Activity: Tremors Speech: Logical/coherent Level of Consciousness: Quiet/awake Mood: Depressed;Anxious;Helpless;Guilty;Sullen;Worthless, low self-esteem Affect: Anxious;Depressed;Sullen Anxiety Level: Moderate Thought Processes: Relevant Judgement: Impaired Orientation:  Person;Place;Time;Situation Obsessive Compulsive Thoughts/Behaviors: Severe  Cognitive Functioning Concentration: Decreased Memory: Recent Impaired;Remote Impaired IQ: Above Average Insight: Poor Impulse Control: Poor Appetite: Poor Weight Loss: 5  Weight Gain: 0  Sleep: No Change Total Hours of Sleep: 8  Vegetative Symptoms: None  Prior Inpatient Therapy Prior Inpatient Therapy: Yes Prior Therapy Dates: 2010 and 2004 Prior [facility] Facilty[facility]Provider([facility] Reason for Treatment: detox and SA treatment  Prior Outpatient Therapy Prior Outpatient Therapy: Yes Prior Therapy Dates: 2010 and 2004 and ongoing  [facility] Reason for Treatment: SA          Abuse/Neglect Assessment (Assessment to be complete while patient is alone) Physical Abuse: Denies Verbal Abuse: Denies Sexual Abuse: Denies Exploitation of patient/patient's resources: Denies Self-Neglect: Yes, past (Comment) (I don't even eat anymore.  I just drink all day long.) Values / Beliefs Cultural Requests During Hospitalization: None Spiritual Requests During Hospitalization: None   Advance Directives (For Healthcare) Advance Directive: Patient does not have advance directive    Additional Information 1:1 In Past 12 Months?: No CIRT Risk: No Elopement Risk: No Does patient have medical clearance?: Yes     Disposition:  Disposition Disposition of Patient: Referred to [facility])  On Site Evaluation by:   Reviewed with  Physician:     Angelica Ran 04/29/2011 9:28 AM

## 2011-04-29 NOTE — ED Notes (Signed)
Pt presents for need of detox from ETOH.  Pt reports drinking heavily x 2-3 months, last drank last night.  Pt reports he normally drinks approximately 1 bottle of wine and 12 pack of beer daily.  Pt denies any other drugs.  Pt admits to recent suicidal ideations with no plan.

## 2011-04-29 NOTE — Progress Notes (Signed)
Patient arrive to unit and it was left unclear if patient was currently having suicidal ideations. MD notified. MD assessed patient and stated that he does not need any additional orders and that his not currently having any suicidal thoughts at this time. Will continue to monitor resident and report off to next shift nurse.

## 2011-04-29 NOTE — ED Notes (Signed)
Dinner tray ordered Regular non-sharp. 

## 2011-04-29 NOTE — ED Provider Notes (Signed)
History     CSN: [id number]  Arrival date & time 04/29/11  4098   First MD Initiated Contact with Patient 04/29/11 0732      Chief Complaint  Patient presents with  . Psychiatric Evaluation    (Consider location/radiation/quality/duration/timing/severity/associated sxs/prior treatment) HPI History provided by pt.   Pt presents w/ request for alcohol detox.  Drinks either a fifth of vodka or an 18 pack of beer on a typical day.  Most recent drink 12 hours ago.  Currently experiencing mild tremors.  No h/o DTs.  Has been through detox twice in the past and the first time was successful for approx 2 years.  Does not abuse any other substances.  Has had thoughts of killing himself that he attributes to the alcohol.  He is going through a divorce and his wife has a $100,000 Brewing technologist on him.  No plan and states that he would never act on these thoughts.  Does not own a gun.    Past Medical History  Diagnosis Date  . Coronary artery disease     Past Surgical History  Procedure Date  . Coronary stent placement     No family history on file.  History  Substance Use Topics  . Smoking status: Never Smoker   . Smokeless tobacco: Not on file  . Alcohol Use: Yes      Review of Systems  All other systems reviewed and are negative.    Allergies  Review of patient's allergies indicates no known allergies.  Home Medications   Current Outpatient Rx  Name Route Sig Dispense Refill  . ASPIRIN 325 MG PO TABS Oral Take 81 mg by mouth daily.    Marland Kitchen CLOPIDOGREL BISULFATE 75 MG PO TABS Oral Take 75 mg by mouth daily.    . ADULT MULTIVITAMIN W/MINERALS CH Oral Take 1 tablet by mouth daily.    Marland Kitchen SIMVASTATIN 80 MG PO TABS Oral Take 80 mg by mouth daily.    Marland Kitchen VITAMIN C 500 MG PO TABS Oral Take 500 mg by mouth daily.      BP 140/95  Pulse 128  Temp(Src) 97.9 F (36.6 C) (Oral)  Resp 16  Ht 5\' 10"  (1.778 m)  Wt 190 lb (86.183 kg)  BMI 27.26 kg/m2  SpO2 98%  Physical Exam    Nursing note and vitals reviewed. Constitutional: He is oriented to person, place, and time. He appears well-developed and well-nourished. No distress.  HENT:  Head: Normocephalic and atraumatic.  Eyes:       Normal appearance  Neck: Normal range of motion.  Cardiovascular: Normal rate and regular rhythm.   Pulmonary/Chest: Effort normal and breath sounds normal.  Musculoskeletal: Normal range of motion.       No tremors  Neurological: He is alert and oriented to person, place, and time.  Skin: Skin is warm. No rash noted. He is not diaphoretic.  Psychiatric: His behavior is normal. Judgment and thought content normal.       Flat affect    ED Course  Procedures (including critical care time)  Labs Reviewed  CBC - Abnormal; Notable for the following:    WBC 1.5 (*)    Platelets 88 (*) PLATELET COUNT CONFIRMED BY SMEAR   All other components within normal limits  DIFFERENTIAL - Abnormal; Notable for the following:    Neutro Abs 1.0 (*)    Lymphs Abs 0.3 (*)    All other components within normal limits  BASIC METABOLIC PANEL - Abnormal;  Notable for the following:    Sodium 134 (*)    Potassium 3.1 (*)    Chloride 91 (*)    Calcium 8.3 (*)    GFR calc non Af Amer 88 (*)    All other components within normal limits  ETHANOL - Abnormal; Notable for the following:    Alcohol, Ethyl (B) 113 (*)    All other components within normal limits  URINALYSIS, ROUTINE W REFLEX MICROSCOPIC - Abnormal; Notable for the following:    Hgb urine dipstick MODERATE (*)    Protein, ur 100 (*)    All other components within normal limits  URINE MICROSCOPIC-ADD ON - Abnormal; Notable for the following:    Casts HYALINE CASTS (*)    All other components within normal limits  URINE RAPID DRUG SCREEN (HOSP PERFORMED)   Dg Chest 2 View  04/29/2011  *RADIOLOGY REPORT*  Clinical Data: Short of breath and weakness.  CHEST - 2 VIEW  Comparison: None.  Findings: Heart size is normal.  Mediastinal  shadows are normal. The vascularity is normal.  Lungs are clear.  No effusions. Ordinary degenerative changes effect the spine.  IMPRESSION: No active cardiopulmonary disease.  Original Report Authenticated By: Thomasenia Sales, M.D.   Ct Head Wo Contrast  04/29/2011  *RADIOLOGY REPORT*  Clinical Data: Fall, nosebleed, alcohol withdrawal  CT HEAD WITHOUT CONTRAST  Technique:  Contiguous axial images were obtained from the base of the skull through the vertex without contrast.  Comparison: 06/03/2008  Findings: Diffuse brain atrophy evident without acute hemorrhage, definite infarction, mass lesion, midline shift, herniation, hydrocephalus, focal edema, or extra-axial fluid collection.  Gray- white matter differentiation maintained.  Cisterns patent. Cerebellar atrophy as well.  Orbits are symmetric.  Mastoids clear. Sinuses are clear except for chronic left maxillary sinus disease, sinusitis not excluded.  IMPRESSION: Stable brain atrophy.  No acute intracranial process by noncontrast CT.  Left maxillary sinus disease.  Original Report Authenticated By: Judie Petit. Ruel Favors, M.D.     1. Alcohol withdrawal   2. HLD (hyperlipidemia)   3. HTN (hypertension)       MDM  60yo alcoholic presents w/ request for alcohol detox.  No h/o DTs and no signs of withdrawal currently.  Labs pending.  ACT team has been consulted.  Holding (including CIWA protocol) orders have been written.  8:52 AM   Labs significant for neutropenia and thrombocytopenia.  Patient is currently febrile, tachycardic and hypertensive.  Ibuprofen ordered for fever and CXR to r/o pneumonia.  He reports feeling shaky, though continues to be w/out tremors or diaphoresis on exam.  He is not vomiting and denies having pain anywhere.  Per prior chart, pt was admitted for "Acute Alcohol Withdrawal Syndrome" in 05/2008.  Pt does not appear to be appropriate for BHS at this time and Dr. Ignacia Palma in agreement.  Will admit for inpatient management of detox.    [facility] has accepted him to their service.     Medical screening examination/treatment/procedure(s) were conducted as a shared visit with non-physician practitioner(s) and myself.  I personally evaluated the patient during the encounter 61 year old man with history of alcohol abuse.  Exam shows him awake and alert, mildly tremulous.  Lab workup showed K mildly low at 3.1, WBC low at 1,500, UA negative, ETOH mildly elevated at 113, and urine drug screen negative. He had a persistent resting tachycardia over a six hour period, and his temp went up to 101.2, without lab evidence of a source of  fever.  Advised inpatient admission for alcohol detox. Osvaldo Human, M.D.       Arie Sabina [place], PA 04/29/11 1738  Carleene Cooper III, MD 04/29/11 2233

## 2011-04-29 NOTE — Progress Notes (Signed)
Night Float Progress Note  Asked by day team to eval pt for withdrawal.  Question of possible SI also raised by RN staff  S: Pt says he is doing okay, just a bit thirsty.  No pain, tremors.  O: Filed Vitals:   04/29/11 1830  BP: 132/76  Pulse: 141  Temp: 98.5 F (36.9 C)  Resp: 20  Gen: NAD, resting comfortably.  Appropriate, responsive to questions. No tremors. Psych: Denies SI.  Says in the past he had a fleeting thought of SI of "just checking out", but has never had a plan, does not have SI at the moment, and would never act on such thoughts.  A/P: # Withdrawal Currently on CIWA, is tachycardic but has no complaints and is w/o tremors. - con't CIWA  # Depression Has loose h/o SI, but assures [place] he has no active SI, no plan.  Has no h/o attempts, does not own gun.  Says he would have RN call us if anything changes.  Does have h/o depression and multiple social stressors. - con't treatment as per primary team - per our eval, no need for sitter at this time

## 2011-04-29 NOTE — ED Notes (Signed)
Informed nurse in report that all pt's belongings that were inventoried will be sent up with pt and that they need to be locked up. Sent a copy of pt behavioral belonging inventory list with pt.

## 2011-04-29 NOTE — ED Notes (Signed)
Determined by Irving Burton, ACT Team that pt is not suicidal.

## 2011-04-29 NOTE — ED Notes (Signed)
Patient transported to CT 

## 2011-04-29 NOTE — ED Notes (Signed)
Pt wanded by security, placed in scrubs & all belongings taken away.  House coverage aware of need for sitter.  Diet tray ordered

## 2011-04-29 NOTE — ED Notes (Addendum)
Patient is resting comfortably. Diet tray ordered

## 2011-04-29 NOTE — ED Notes (Signed)
Admitting MD at bedside.

## 2011-04-29 NOTE — ED Notes (Signed)
Patient denies pain and is resting comfortably.  

## 2011-04-30 ENCOUNTER — Other Ambulatory Visit: Payer: Self-pay

## 2011-04-30 LAB — URINALYSIS, ROUTINE W REFLEX MICROSCOPIC
Ketones, ur: NEGATIVE mg/dL
Specific Gravity, Urine: 1.008 (ref 1.005–1.030)

## 2011-04-30 LAB — CBC
HCT: 38.8 % — ABNORMAL LOW (ref 39.0–52.0)
MCH: 31.9 pg (ref 26.0–34.0)
MCHC: 33.5 g/dL (ref 30.0–36.0)
MCV: 95.1 fL (ref 78.0–100.0)
RDW: 13.6 % (ref 11.5–15.5)

## 2011-04-30 LAB — URINE MICROSCOPIC-ADD ON

## 2011-04-30 LAB — BASIC METABOLIC PANEL
BUN: 9 mg/dL (ref 6–23)
Chloride: 91 mEq/L — ABNORMAL LOW (ref 96–112)
Creatinine, Ser: 0.92 mg/dL (ref 0.50–1.35)
GFR calc Af Amer: >90 mL/min (ref >90)

## 2011-04-30 MED ORDER — POTASSIUM CHLORIDE CRYS ER 20 MEQ PO TBCR
40.0000 meq | EXTENDED_RELEASE_TABLET | Freq: Two times a day (BID) | ORAL | Status: DC
  Administered 2011-04-30: 40 meq via ORAL
  Filled 2011-04-30 (×2): qty 2

## 2011-04-30 NOTE — Progress Notes (Signed)
Clinical Social Work:  Attempted to make  Referral for [facility] but they have capped for the day and will not be taking any other referrals.  Will make referral at scheduled time at 9am tomorrow for patient and send clinicals.  Possible bed if patient is accepted.  Will updated CSW about assessment and follow up in the AM.  If patient medically stable, please indicate in progress note of dc summary, that will help with getting patient into treatment quicker.    Ashley Jacobs, MSW LCSW [phone]

## 2011-04-30 NOTE — Progress Notes (Signed)
Subjective:  He experienced two episodes of urinary incontinence this morning while sleeping.  He has some action tremors but says he feels ok otherwise.  He is anxious to know what rehab options he has given his uncertain insurance situation.    Objective: Vital signs in last 24 hours: Filed Vitals:   04/29/11 2124 04/30/11 0112 04/30/11 0435 04/30/11 0900  BP: 143/81 150/81 160/94 158/88  Pulse: 108 106 103 104  Temp: 99 F (37.2 C) 99.3 F (37.4 C) 99.1 F (37.3 C) 100 F (37.8 C)  TempSrc:      Resp: 20 20 16 18   Height:   5\' 10"  (1.778 m)   Weight:   186 lb 1.1 oz (84.4 kg)   SpO2: 94% 94% 99%    Weight change:   Intake/Output Summary (Last 24 hours) at 04/30/11 1049 Last data filed at 04/30/11 0913  Gross per 24 hour  Intake   2345 ml  Output    250 ml  Net   2095 ml   Physical Exam:  General: alert, well-developed, and cooperative to examination. Anxious. Head: normocephalic and atraumatic.  Eyes: vision grossly intact, pupils equal, pupils round, pupils reactive to light, no injection and anicteric.  Mouth: pharynx pink and moist, no erythema, and no exudates.  Neck: no JVD, and no carotid bruits.  Lungs: normal respiratory effort, no accessory muscle use, normal breath sounds, no crackles, and no wheezes. Heart: normal rate, regular rhythm, no murmur, no gallop, and no rub.  Abdomen: soft, non-tender, normal bowel sounds, no distention, no guarding, no rebound tenderness, no hepatomegaly Msk: no joint swelling, no joint warmth, and no redness over joints.  Pulses: 2+ DP/PT pulses bilaterally Extremities: No cyanosis, clubbing, edema   Neurologic: alert & oriented X3, cranial nerves II-XII intact, strength normal in all extremities, sensation intact to light touch. Action tremor in all 4 extremities and tongue.   Skin: turgor normal and no rashes.        Lab Results: Basic Metabolic Panel:  Lab 04/30/11 1610 04/29/11 1811 04/29/11 0856  NA 131* --  134*  K 3.1* -- 3.1*  CL 91* -- 91*  CO2 32 -- 31  GLUCOSE 96 -- 99  BUN 9 -- 10  CREATININE 0.92 -- 0.96  CALCIUM 8.3* -- 8.3*  MG -- 2.1 --  PHOS -- 3.9 --   Liver Function Tests:  Lab 04/29/11 1811  AST 844*  ALT 325*  ALKPHOS 141*  BILITOT 0.5  PROT 5.7*  ALBUMIN 2.9*   CBC:  Lab 04/30/11 0611 04/29/11 0856  WBC 1.5* 1.5*  NEUTROABS -- 1.0*  HGB 13.0 13.8  HCT 38.8* 40.0  MCV 95.1 93.2  PLT 79* 88*   Urine Drug Screen: Drugs of Abuse     Component Value Date/Time   LABOPIA NONE DETECTED 04/29/2011 0901   COCAINSCRNUR NONE DETECTED 04/29/2011 0901   LABBENZ NONE DETECTED 04/29/2011 0901   AMPHETMU NONE DETECTED 04/29/2011 0901   THCU NONE DETECTED 04/29/2011 0901   LABBARB NONE DETECTED 04/29/2011 0901    Alcohol Level:  Lab 04/29/11 0856  ETH 113*   Urinalysis:  Lab 04/29/11 0901  COLORURINE YELLOW  LABSPEC 1.010  PHURINE 7.0  GLUCOSEU NEGATIVE  HGBUR MODERATE*  BILIRUBINUR NEGATIVE  KETONESUR NEGATIVE  PROTEINUR 100*  UROBILINOGEN 1.0  NITRITE NEGATIVE  LEUKOCYTESUR NEGATIVE   Studies/Results: Dg Chest 2 View  04/29/2011  *RADIOLOGY REPORT*  Clinical Data: Short of breath and weakness.  CHEST - 2 VIEW  Comparison:  None.  Findings: Heart size is normal.  Mediastinal shadows are normal. The vascularity is normal.  Lungs are clear.  No effusions. Ordinary degenerative changes effect the spine.  IMPRESSION: No active cardiopulmonary disease.  Original Report Authenticated By: Thomasenia Sales, M.D.   Ct Head Wo Contrast  04/29/2011  *RADIOLOGY REPORT*  Clinical Data: Fall, nosebleed, alcohol withdrawal  CT HEAD WITHOUT CONTRAST  Technique:  Contiguous axial images were obtained from the base of the skull through the vertex without contrast.  Comparison: 06/03/2008  Findings: Diffuse brain atrophy evident without acute hemorrhage, definite infarction, mass lesion, midline shift, herniation, hydrocephalus, focal edema, or extra-axial fluid collection.  Gray-  white matter differentiation maintained.  Cisterns patent. Cerebellar atrophy as well.  Orbits are symmetric.  Mastoids clear. Sinuses are clear except for chronic left maxillary sinus disease, sinusitis not excluded.  IMPRESSION: Stable brain atrophy.  No acute intracranial process by noncontrast CT.  Left maxillary sinus disease.  Original Report Authenticated By: Judie Petit. Ruel Favors, M.D.   Medications: I have reviewed the patient's current medications. Scheduled Meds:   . aspirin EC  325 mg Oral Daily  . atorvastatin  40 mg Oral q1800  . clopidogrel  75 mg Oral Daily  . enoxaparin  30 mg Subcutaneous Q24H  . folic acid  1 mg Oral Daily  . LORazepam  2 mg Oral Once   Or  . LORazepam  2 mg Intramuscular Once  . LORazepam  0-4 mg Oral Q6H   Followed by  . LORazepam  0-4 mg Oral Q12H  . mulitivitamin with minerals  1 tablet Oral Daily  . pantoprazole  40 mg Oral Q1200  . potassium chloride  40 mEq Oral Once  . sodium chloride  1,000 mL Intravenous Once  . thiamine  100 mg Oral Daily   Or  . thiamine  100 mg Intravenous Daily  . DISCONTD: acetaminophen  650 mg Oral Once  . DISCONTD: folic acid  1 mg Oral Daily  . DISCONTD: LORazepam  1 mg Oral Q6H  . DISCONTD: LORazepam  1 mg Oral Q6H  . DISCONTD: mulitivitamin with minerals  1 tablet Oral Daily  . DISCONTD: thiamine  100 mg Intravenous Daily  . DISCONTD: thiamine  100 mg Oral Daily   Continuous Infusions:   . sodium chloride 125 mL/hr at 04/30/11 0128   PRN Meds:.LORazepam, LORazepam, ondansetron, DISCONTD: alum & mag hydroxide-simeth, DISCONTD: ibuprofen, DISCONTD: LORazepam, DISCONTD: LORazepam, DISCONTD: LORazepam, DISCONTD: morphine    Assessment/Plan:   Alcohol withdrawal: Tremulous, tachycardic, hypertensive, all likely due to his alcohol withdrawal. It seems that he has been getting enough Ativan as he has not gotten worse than yesterday.  Will discuss his case with CSW today to determine his options for rehab.     -Ativan: CIWA Protocol -Thiamine and folic acid daily -Elevated LFTs likely due to alcoholism, but will check acute hepatitis panel since I cannot find any hepatitis serologies previously -Zofran PRN  -Protonix scheduled, Maalox PRN for heartburn  -Morphine PRN for pain   CAD, History of PCI: 2007 Cath showed 95% mid LAD stenosis, stented with baremetal stent has been on Plavix since then. Also taking "1/3 of a full dose aspirin" at home. Simvastatin at home as well.  12 lead EKG to make sure he does not have demand ischemia in the midst of his withdrawal  ASA 81mg  daily  Continue Plavix daily  Contiue Statin   HTN (hypertension): On no medication as outpatient. BPs 150s-160s/80s-90s slightly improved, likely  due to alcohol withdrawal. Will treat withdrawal with ativan and see how BP responds before starting any medication for BP. Clonidine would be a good choice in the acute withdrawal setting if pressures get too high.   HLD (hyperlipidemia): Lipid panel. Will continue statin.  DVT Prophylaxis: Lovenox    LOS: 1 day   Blanca Friend 04/30/2011, 10:49 AM

## 2011-04-30 NOTE — Progress Notes (Signed)
Chaplain attempted to visit patient in response to a spiritual care consult. Patient was not available. Chaplain will try again at a later time.

## 2011-04-30 NOTE — Progress Notes (Addendum)
INITIAL ADULT NUTRITION ASSESSMENT Date: 04/30/2011   Time: 8:26 AM   Reason for Assessment: consult  ASSESSMENT: Male 61 y.o.  Dx: Alcohol Withdrawal  Hx:  Past Medical History  Diagnosis Date  . Coronary artery disease     95% mid LAD s/p BMS of LAD, other non critical obstruction, put on plavix and aspirin 325  . HTN (hypertension)   . HLD (hyperlipidemia)   . Alcohol addiction     2010- admitted for withdrawal , detox admission after that    Related Meds:     . aspirin EC  325 mg Oral Daily  . atorvastatin  40 mg Oral q1800  . clopidogrel  75 mg Oral Daily  . enoxaparin  30 mg Subcutaneous Q24H  . folic acid  1 mg Oral Daily  . LORazepam  2 mg Oral Once   Or  . LORazepam  2 mg Intramuscular Once  . LORazepam  0-4 mg Oral Q6H   Followed by  . LORazepam  0-4 mg Oral Q12H  . mulitivitamin with minerals  1 tablet Oral Daily  . pantoprazole  40 mg Oral Q1200  . potassium chloride  40 mEq Oral Once  . sodium chloride  1,000 mL Intravenous Once  . thiamine  100 mg Oral Daily   Or  . thiamine  100 mg Intravenous Daily  . DISCONTD: acetaminophen  650 mg Oral Once  . DISCONTD: folic acid  1 mg Oral Daily  . DISCONTD: LORazepam  1 mg Oral Q6H  . DISCONTD: LORazepam  1 mg Oral Q6H  . DISCONTD: mulitivitamin with minerals  1 tablet Oral Daily  . DISCONTD: thiamine  100 mg Intravenous Daily  . DISCONTD: thiamine  100 mg Oral Daily     Ht: 5\' 10"  (177.8 cm)  Wt: 186 lb 1.1 oz (84.4 kg)  Ideal Wt: 75.5 kg % Ideal Wt: 112$  Usual Wt: 200 lbs (90.9 kg) % Usual Wt: 93%  Body mass index is 26.70 kg/(m^2). Overweight  Food/Nutrition Related Hx: H&P states most of patients calories have come from alcohol for about 2 months. Patient states that he has lost weight over the past two months.   Labs:  CMP     Component Value Date/Time   NA 131* 04/30/2011 0611   K 3.1* 04/30/2011 0611   CL 91* 04/30/2011 0611   CO2 32 04/30/2011 0611   GLUCOSE 96 04/30/2011 0611   BUN  9 04/30/2011 0611   CREATININE 0.92 04/30/2011 0611   CALCIUM 8.3* 04/30/2011 0611   PROT 5.7* 04/29/2011 1811   ALBUMIN 2.9* 04/29/2011 1811   AST 844* 04/29/2011 1811   ALT 325* 04/29/2011 1811   ALKPHOS 141* 04/29/2011 1811   BILITOT 0.5 04/29/2011 1811   GFRNONAA 90* 04/30/2011 0611   GFRAA >90 04/30/2011 1610      Intake/Output Summary (Last 24 hours) at 04/30/11 0904 Last data filed at 04/30/11 9604  Gross per 24 hour  Intake   1985 ml  Output    250 ml  Net   1735 ml     Diet Order: Carb Control medium  Supplements/Tube Feeding: none  IVF:    sodium chloride Last Rate: 125 mL/hr at 04/30/11 0128    Estimated Nutritional Needs:   Kcal: 1900-2100 Protein: 85-100 gm Fluid: 1.9 - 2.1 L  Patients PO intake has been limited to calories from alcohol (beer/liquor) for about 2 months. Weight loss has been 7%, significant. Based on patients report of po intake limited  to alcohol, RD interprets this as less then 75% of estimated nutrition needs. Patient meets criteria for moderate malnutrition in the context of acute illness 2/2 to the above weight loss and intake.   NUTRITION DIAGNOSIS: Excessive alcohol consumption  Status: Ongoing  RELATED TO: drinking alcohol and not consuming foods  AS EVIDENCE BY: weight loss and likely vitamin and mineral depletions   MONITORING/EVALUATION(Goals): Goal: Patient will tolerate diet and consume adequate nutrition to meet needs Monitor: PO intake, weight, labs  EDUCATION NEEDS: -No education needs identified at this time  INTERVENTION: 1. Agree with current vitamin and mineral supplementation.  2. RD will follow  Dietitian [phone]  DOCUMENTATION CODES Per approved criteria  -Non-severe (moderate) malnutrition in the context of acute illness or injury    KOWALSKI, Sheriff Rodenberg MARIE 04/30/2011, 8:26 AM

## 2011-04-30 NOTE — Progress Notes (Addendum)
Clinical Social Work Department BRIEF PSYCHOSOCIAL ASSESSMENT 04/30/2011  Patient:  Sean [name], Sean [name]     Account Number:  [id number]     Admit date:  04/29/2011  Clinical Social Worker:  Andres Shad  Date/Time:  04/30/2011 02:40 PM  Referred by:  Physician  Date Referred:  04/30/2011 Referred for  Substance Abuse   Other Referral:   Interview type:  Patient Other interview type:   Chart reviewed.    PSYCHOSOCIAL DATA Living Status:  WIFE Admitted from facility:   Level of care:  Independent Living Primary support name:  wife: Usman Millett Primary support relationship to patient:  SPOUSE Degree of support available:   Poor support as of right now. The two are going through a legal separation due to patient etoh habits.  reports he currently lives with her in their home.  married 40 years. Wife is one who brought patient to the hospital.    CURRENT CONCERNS Current Concerns  Substance Abuse   Other Concerns:   Increased drinking if patient returns home.    SOCIAL WORK ASSESSMENT / PLAN met with patient at length to discuss his drinking habits. patient reports he was drinking daily hard liquor 1/5 vodka a day or 18 pack beers and 3-4 wine bottles which he will buy and his wife will also buy.  Patient reports he is motivated to be sober again, has been sober in the past once in 2004 for two years and a few months in 2010.  Has also completed [facility] inpatient treatments.  Has also had outpatient follow up with AA.  reports he recently became very depressed in which all he does is drink and sits on his couch at home daily.   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  patient reports he begins his days with alcohol and ends his days with black outs. Reports depression stems from job loss and recent stressors and separation in marriage.  reports he started drinking at a  young age socially, but developed into a habit after work and stressful days causing him to use alcohol to cope. Currently patient is very pleasant, engaged, good eye contact and cooperative.  He is not currently combative, SI, psychotic, or going through DT's.  patient at times very slow to responds, but may be at his baseline mentally due to heavy alcohol use.  Patient is wanting and requesting referral for inpatient substance abuse rehab. Agreeable for CSW to send referral and motivated to change alcohol use because he is ready to work again, try and restore marriage and also like being sober and in control.   Assessment/plan status:  Information/Referral to [facility] Other assessment/ plan:   Will refer patient to [facility] and [facility] for treatment options.    If denied by both could be [facility] for SA.   Information/referral to community resources:   Also completed SBIRT which can be found in doc flow sheets.    PATIENT'S/FAMILY'S RESPONSE TO PLAN OF CARE: Patient very pleasent and agreeable.  Engaged in conversation and also willing to participate in assessment and motivated for treatment.  Reports if dc home he would relapse immedatley because of the availability of alochol. Wife does not seem to be involved, reports he has only talked to her once and she has not been up to see patient at hospital.  Reports he will be calling her with regards to referral for inpatient.    Ashley Jacobs, MSW LCSW [phone]  May consider psych for medication management for depression symptoms and behaviors. **

## 2011-04-30 NOTE — Progress Notes (Signed)
Internal Medicine Teaching Service Attending Note Date: 04/30/2011  Patient name: Sean [name]  Medical record number: [id number]  Date of birth: 1950/07/07   I have seen and evaluated [name] and discussed their care with the Residency Team.  Mr. Nylen is gratefully making another attempt at alcohol recovery and I agree with housestaff's evaluation and plans.   Physical Exam: Blood pressure 132/93, pulse 114, temperature 100 F (37.8 C), temperature source Oral, resp. rate 18, height 5\' 10"  (1.778 m), weight 186 lb 1.1 oz (84.4 kg), SpO2 99.00%.   Lab results: Results for orders placed during the hospital encounter of 04/29/11 (from the past 24 hour(s))  HEPATIC FUNCTION PANEL     Status: Abnormal   Collection Time   04/29/11  6:11 PM      Component Value Range   Total Protein 5.7 (*) 6.0 - 8.3 (g/dL)   Albumin 2.9 (*) 3.5 - 5.2 (g/dL)   AST 409 (*) 0 - 37 (U/L)   ALT 325 (*) 0 - 53 (U/L)   Alkaline Phosphatase 141 (*) 39 - 117 (U/L)   Total Bilirubin 0.5  0.3 - 1.2 (mg/dL)   Bilirubin, Direct 0.2  0.0 - 0.3 (mg/dL)   Indirect Bilirubin 0.3  0.3 - 0.9 (mg/dL)  MAGNESIUM     Status: Normal   Collection Time   04/29/11  6:11 PM      Component Value Range   Magnesium 2.1  1.5 - 2.5 (mg/dL)  PHOSPHORUS     Status: Normal   Collection Time   04/29/11  6:11 PM      Component Value Range   Phosphorus 3.9  2.3 - 4.6 (mg/dL)  BASIC METABOLIC PANEL     Status: Abnormal   Collection Time   04/30/11  6:11 AM      Component Value Range   Sodium 131 (*) 135 - 145 (mEq/L)   Potassium 3.1 (*) 3.5 - 5.1 (mEq/L)   Chloride 91 (*) 96 - 112 (mEq/L)   CO2 32  19 - 32 (mEq/L)   Glucose, Bld 96  70 - 99 (mg/dL)   BUN 9  6 - 23 (mg/dL)   Creatinine, Ser 8.11  0.50 - 1.35 (mg/dL)   Calcium 8.3 (*) 8.4 - 10.5 (mg/dL)   GFR calc non Af Amer 90 (*) >90 (mL/min)   GFR calc Af Amer >90  >90 (mL/min)  CBC     Status: Abnormal   Collection Time   04/30/11  6:11 AM      Component Value Range   WBC 1.5 (*) 4.0 - 10.5 (K/uL)   RBC 4.08 (*) 4.22 - 5.81 (MIL/uL)   Hemoglobin 13.0  13.0 - 17.0 (g/dL)   HCT 91.4 (*) 78.2 - 52.0 (%)   MCV 95.1  78.0 - 100.0 (fL)   MCH 31.9  26.0 - 34.0 (pg)   MCHC 33.5  30.0 - 36.0 (g/dL)   RDW 95.6  21.3 - 08.6 (%)   Platelets 79 (*) 150 - 400 (K/uL)    Imaging results:  Dg Chest 2 View  04/29/2011  *RADIOLOGY REPORT*  Clinical Data: Short of breath and weakness.  CHEST - 2 VIEW  Comparison: None.  Findings: Heart size is normal.  Mediastinal shadows are normal. The vascularity is normal.  Lungs are clear.  No effusions. Ordinary degenerative changes effect the spine.  IMPRESSION: No active cardiopulmonary disease.  Original Report Authenticated By: Thomasenia Sales, M.D.   Ct Head Wo Contrast  04/29/2011  *RADIOLOGY REPORT*  Clinical Data: Fall, nosebleed, alcohol withdrawal  CT HEAD WITHOUT CONTRAST  Technique:  Contiguous axial images were obtained from the base of the skull through the vertex without contrast.  Comparison: 06/03/2008  Findings: Diffuse brain atrophy evident without acute hemorrhage, definite infarction, mass lesion, midline shift, herniation, hydrocephalus, focal edema, or extra-axial fluid collection.  Gray- white matter differentiation maintained.  Cisterns patent. Cerebellar atrophy as well.  Orbits are symmetric.  Mastoids clear. Sinuses are clear except for chronic left maxillary sinus disease, sinusitis not excluded.  IMPRESSION: Stable brain atrophy.  No acute intracranial process by noncontrast CT.  Left maxillary sinus disease.  Original Report Authenticated By: Judie Petit. Ruel Favors, M.D.    Assessment and Plan: I agree with the formulated Assessment and Plan with the following changes: His thrombocytopenia and hepatitis are likely all secondary to alcohol. If he hasn't been screened for HBV or HCV he should be since both are suppressible to curative with antiviral therpay and enhance the damaging effects of alcohol.

## 2011-05-01 DIAGNOSIS — F10239 Alcohol dependence with withdrawal, unspecified: Principal | ICD-10-CM

## 2011-05-01 LAB — HEPATITIS PANEL, ACUTE
HCV Ab: NEGATIVE
Hep A IgM: NEGATIVE
Hep B C IgM: NEGATIVE

## 2011-05-01 LAB — BASIC METABOLIC PANEL
CO2: 30 mEq/L (ref 19–32)
GFR calc non Af Amer: 89 mL/min — ABNORMAL LOW (ref >90)
Glucose, Bld: 100 mg/dL — ABNORMAL HIGH (ref 70–99)
Potassium: 3 mEq/L — ABNORMAL LOW (ref 3.5–5.1)
Sodium: 131 mEq/L — ABNORMAL LOW (ref 135–145)

## 2011-05-01 MED ORDER — ASPIRIN EC 81 MG PO TBEC
81.0000 mg | DELAYED_RELEASE_TABLET | Freq: Every day | ORAL | Status: DC
  Administered 2011-05-01 – 2011-05-05 (×5): 81 mg via ORAL
  Filled 2011-05-01 (×5): qty 1

## 2011-05-01 MED ORDER — POTASSIUM CHLORIDE CRYS ER 20 MEQ PO TBCR
40.0000 meq | EXTENDED_RELEASE_TABLET | Freq: Once | ORAL | Status: AC
  Administered 2011-05-01: 40 meq via ORAL
  Filled 2011-05-01: qty 2

## 2011-05-01 NOTE — Progress Notes (Signed)
Clinical Social Work:  Following for IP substance abuse rehab.  Patient status of insurance is unknown at this point.  Have contacted his wife for any additional assistance or information and wife is also unknown.  Patient has [facility] but we do not know his current status of employment.  Patient wife reports she tried to speak with benefits on 3/14 but they declined to speak with her.  She does report she has been paying weekly to keep his insurance up to date and active.  Patient has been instructed to contact employer for status of employment or if he has been laid off.  Patient only reports he was told he could have 26 weeks of medical leave, but it has been weeks since he has spoken to anyone.  [facility] has been contacted and referral has still not been made due to insurance status.  If patient is employed then it can be filed under his insurance.  If not employed he will be a self pay or without insurance.  Have been speaking with intake supervisor Misty Stanley at [facility] who is working on [employer].  As for a bed status and admission for today, CSW spoke with assessment intake worker.  Completed prescreen for possible admission today.  If patient medically cleared patient can go to [facility].   Will send requested documents:  Labs and vitals along with EKG.  Will need a dc summary as well.

## 2011-05-01 NOTE — Progress Notes (Signed)
CSW f/u with [facility] to ask if Pt can dc on ativan taper. Pt CANNOT go to tx on Ativan. Pt will need to complete taper prior to beginning tx at facility.  Wknd CSW can contact [facility] at 9am on Saturday morning for possible bed. [facility] will keep his file open until then.  [facility] will need a dc summary faxed in the morning.  [facility] phone number is [phone].    Frederico Hamman, LCSW [phone]

## 2011-05-01 NOTE — Progress Notes (Signed)
Subjective:  Feels ok this morning.  Had another episode or urinary leakage.  Some hesitancy trying to urinate as well.      Objective: Vital signs in last 24 hours: Filed Vitals:   04/30/11 1620 04/30/11 2130 05/01/11 0015 05/01/11 0529  BP: 146/93 138/84 131/88 141/98  Pulse: 107 105 107 100  Temp:  99.4 F (37.4 C) 98.6 F (37 C) 98 F (36.7 C)  TempSrc:   Oral   Resp:  18 16 18   Height:      Weight:    181 lb 10.5 oz (82.4 kg)  SpO2:  98% 96% 97%   Weight change: -8 lb 5.5 oz (-3.784 kg)  Intake/Output Summary (Last 24 hours) at 05/01/11 0756 Last data filed at 05/01/11 1610  Gross per 24 hour  Intake 2648.33 ml  Output    925 ml  Net 1723.33 ml   Physical Exam:  General: alert, well-developed, and cooperative to examination. Anxious. Head: normocephalic and atraumatic.  Eyes: vision grossly intact, pupils equal, pupils round, pupils reactive to light, no injection and anicteric.  Mouth: pharynx pink and moist, no erythema, and no exudates.  Neck: no JVD, and no carotid bruits.  Lungs: normal respiratory effort, no accessory muscle use.  Heart: normal rate, regular rhythm, no murmur, no gallop, and no rub.   Msk: no joint swelling, no joint warmth, and no redness over joints.  Pulses: 2+ DP/PT pulses bilaterally Extremities: No cyanosis, clubbing.  No edema.  Neurologic: alert & oriented X3, cranial nerves II-XII intact, strength normal in all extremities, sensation intact to light touch. Action tremor much improved.  Skin: turgor normal and no rashes.      Lab Results: Basic Metabolic Panel:  Lab 05/01/11 9604 04/30/11 0611 04/29/11 1811  NA 131* 131* --  K 3.0* 3.1* --  CL 89* 91* --  CO2 30 32 --  GLUCOSE 100* 96 --  BUN 12 9 --  CREATININE 0.94 0.92 --  CALCIUM 8.9 8.3* --  MG -- -- 2.1  PHOS -- -- 3.9   Liver Function Tests:  Lab 04/29/11 1811  AST 844*  ALT 325*  ALKPHOS 141*  BILITOT 0.5  PROT 5.7*  ALBUMIN 2.9*   CBC:  Lab  04/30/11 0611 04/29/11 0856  WBC 1.5* 1.5*  NEUTROABS -- 1.0*  HGB 13.0 13.8  HCT 38.8* 40.0  MCV 95.1 93.2  PLT 79* 88*   Urine Drug Screen: Drugs of Abuse     Component Value Date/Time   LABOPIA NONE DETECTED 04/29/2011 0901   COCAINSCRNUR NONE DETECTED 04/29/2011 0901   LABBENZ NONE DETECTED 04/29/2011 0901   AMPHETMU NONE DETECTED 04/29/2011 0901   THCU NONE DETECTED 04/29/2011 0901   LABBARB NONE DETECTED 04/29/2011 0901    Alcohol Level:  Lab 04/29/11 0856  ETH 113*   Urinalysis:  Lab 04/30/11 1609 04/29/11 0901  COLORURINE YELLOW YELLOW  LABSPEC 1.008 1.010  PHURINE 7.5 7.0  GLUCOSEU NEGATIVE NEGATIVE  HGBUR TRACE* MODERATE*  BILIRUBINUR NEGATIVE NEGATIVE  KETONESUR NEGATIVE NEGATIVE  PROTEINUR 30* 100*  UROBILINOGEN 0.2 1.0  NITRITE NEGATIVE NEGATIVE  LEUKOCYTESUR NEGATIVE NEGATIVE   Studies/Results: Dg Chest 2 View  04/29/2011  *RADIOLOGY REPORT*  Clinical Data: Short of breath and weakness.  CHEST - 2 VIEW  Comparison: None.  Findings: Heart size is normal.  Mediastinal shadows are normal. The vascularity is normal.  Lungs are clear.  No effusions. Ordinary degenerative changes effect the spine.  IMPRESSION: No active cardiopulmonary disease.  Original  Report Authenticated By: Thomasenia Sales, M.D.   Ct Head Wo Contrast  04/29/2011  *RADIOLOGY REPORT*  Clinical Data: Fall, nosebleed, alcohol withdrawal  CT HEAD WITHOUT CONTRAST  Technique:  Contiguous axial images were obtained from the base of the skull through the vertex without contrast.  Comparison: 06/03/2008  Findings: Diffuse brain atrophy evident without acute hemorrhage, definite infarction, mass lesion, midline shift, herniation, hydrocephalus, focal edema, or extra-axial fluid collection.  Gray- white matter differentiation maintained.  Cisterns patent. Cerebellar atrophy as well.  Orbits are symmetric.  Mastoids clear. Sinuses are clear except for chronic left maxillary sinus disease, sinusitis not excluded.   IMPRESSION: Stable brain atrophy.  No acute intracranial process by noncontrast CT.  Left maxillary sinus disease.  Original Report Authenticated By: Judie Petit. Ruel Favors, M.D.   Medications: I have reviewed the patient's current medications. Scheduled Meds:   . aspirin EC  81 mg Oral Daily  . atorvastatin  40 mg Oral q1800  . clopidogrel  75 mg Oral Daily  . enoxaparin  30 mg Subcutaneous Q24H  . folic acid  1 mg Oral Daily  . LORazepam  0-4 mg Oral Q6H   Followed by  . LORazepam  0-4 mg Oral Q12H  . mulitivitamin with minerals  1 tablet Oral Daily  . pantoprazole  40 mg Oral Q1200  . potassium chloride  40 mEq Oral BID  . thiamine  100 mg Oral Daily   Or  . thiamine  100 mg Intravenous Daily  . DISCONTD: aspirin EC  325 mg Oral Daily   Continuous Infusions:   . DISCONTD: sodium chloride 125 mL/hr at 04/30/11 0128   PRN Meds:.LORazepam, LORazepam, ondansetron, DISCONTD: morphine   Assessment/Plan:   Alcohol withdrawal: Tachycardia slightly improving, hypertension improving.  Tremulous, tachycardic, hypertensive, all likely due to his alcohol withdrawal. It seems that he has been getting enough Ativan as he has not gotten worse than yesterday. Have discussed his case with CSW today to determine his options for rehab.  -Ativan: CIWA Protocol  -Thiamine and folic acid daily  -Elevated LFTs likely due to alcoholism, but will check acute hepatitis panel since I cannot find any hepatitis serologies previously  -Zofran PRN  -Protonix scheduled, Maalox PRN for heartburn   CAD, History of PCI: 2007 Cath showed 95% mid LAD stenosis, stented with baremetal stent has been on Plavix since then. Also taking "1/3 of a full dose aspirin" at home. Simvastatin at home as well.  12 lead EKG to make sure he does not have demand ischemia in the midst of his withdrawal  ASA 81mg  daily  Continue Plavix daily as instructed by cardiologist who placed stent to continue plavix "indefinitely" Contiue  Statin   HTN (hypertension): On no medication as outpatient. BPs 103-140s/80s-90s slightly improved, likely due to alcohol withdrawal improving. Will treat withdrawal with ativan and see how BP responds before starting any medication for BP. Clonidine would be a good choice in the acute withdrawal setting if pressures get too high.   HLD (hyperlipidemia): Lipid panel. Will continue statin.   DVT Prophylaxis: Lovenox     LOS: 2 days   [name], [name] 05/01/2011, 7:56 AM

## 2011-05-01 NOTE — Progress Notes (Signed)
Chaplain responded to a spiritual care consult request. Patient declined visit. No follow up needed.

## 2011-05-02 ENCOUNTER — Encounter (HOSPITAL_COMMUNITY): Payer: Self-pay | Admitting: General Practice

## 2011-05-02 LAB — BASIC METABOLIC PANEL
BUN: 16 mg/dL (ref 6–23)
Chloride: 93 mEq/L — ABNORMAL LOW (ref 96–112)
GFR calc Af Amer: >90 mL/min (ref >90)
Glucose, Bld: 91 mg/dL (ref 70–99)
Potassium: 3.4 mEq/L — ABNORMAL LOW (ref 3.5–5.1)

## 2011-05-02 LAB — CBC
HCT: 39.7 % (ref 39.0–52.0)
Hemoglobin: 13.3 g/dL (ref 13.0–17.0)
MCHC: 33.5 g/dL (ref 30.0–36.0)

## 2011-05-02 MED ORDER — LORAZEPAM 2 MG/ML IJ SOLN
INTRAMUSCULAR | Status: AC
  Filled 2011-05-02: qty 1

## 2011-05-02 MED ORDER — POTASSIUM CHLORIDE CRYS ER 20 MEQ PO TBCR
40.0000 meq | EXTENDED_RELEASE_TABLET | ORAL | Status: AC
  Administered 2011-05-02 (×2): 40 meq via ORAL
  Filled 2011-05-02 (×2): qty 2

## 2011-05-02 MED ORDER — LORAZEPAM 2 MG/ML IJ SOLN
2.0000 mg | INTRAMUSCULAR | Status: DC | PRN
  Administered 2011-05-02: 2 mg via INTRAVENOUS

## 2011-05-02 NOTE — Progress Notes (Signed)
Subjective:  Feels ok this morning. Denies any N/V, abdominal pain, CP/SOB.    Objective: Vital signs in last 24 hours: Filed Vitals:   05/01/11 2149 05/01/11 2330 05/02/11 0539 05/02/11 1100  BP: 124/83 130/80 134/81 128/87  Pulse: 106 108 110 106  Temp: 98.6 F (37 C)  99 F (37.2 C) 98.7 F (37.1 C)  TempSrc: Oral  Oral   Resp: 20  18 16   Height:      Weight:   182 lb 8 oz (82.781 kg)   SpO2: 98%  93% 96%   Weight change: 13.5 oz (0.382 kg)  Intake/Output Summary (Last 24 hours) at 05/02/11 1437 Last data filed at 05/02/11 1100  Gross per 24 hour  Intake    560 ml  Output      0 ml  Net    560 ml   Physical Exam:  General: alert, well-developed, and cooperative to examination. Anxious. Head: normocephalic and atraumatic.  Eyes: vision grossly intact, pupils equal, pupils round, pupils reactive to light, no injection and anicteric.  Mouth: pharynx pink and moist, no erythema, and no exudates.  Neck: no JVD, and no carotid bruits.  Lungs: normal respiratory effort, no accessory muscle use.  Heart: normal rate, regular rhythm, no murmur, no gallop, and no rub.   Msk: no joint swelling, no joint warmth, and no redness over joints.  Pulses: 2+ DP/PT pulses bilaterally Extremities: No cyanosis, clubbing.  No edema.  Neurologic: alert & oriented X3, cranial nerves II-XII intact, strength normal in all extremities, sensation intact to light touch. Action tremor much improved.  Skin: turgor normal and no rashes.      Lab Results: Basic Metabolic Panel:  Lab 05/02/11 1610 05/01/11 0630 04/29/11 1811  NA 132* 131* --  K 3.4* 3.0* --  CL 93* 89* --  CO2 29 30 --  GLUCOSE 91 100* --  BUN 16 12 --  CREATININE 1.01 0.94 --  CALCIUM 8.4 8.9 --  MG -- -- 2.1  PHOS -- -- 3.9   Liver Function Tests:  Lab 04/29/11 1811  AST 844*  ALT 325*  ALKPHOS 141*  BILITOT 0.5  PROT 5.7*  ALBUMIN 2.9*   CBC:  Lab 05/02/11 1237 04/30/11 0611 04/29/11 0856  WBC 2.5*  1.5* --  NEUTROABS -- -- 1.0*  HGB 13.3 13.0 --  HCT 39.7 38.8* --  MCV 96.1 95.1 --  PLT 87* 79* --   Urine Drug Screen: Drugs of Abuse     Component Value Date/Time   LABOPIA NONE DETECTED 04/29/2011 0901   COCAINSCRNUR NONE DETECTED 04/29/2011 0901   LABBENZ NONE DETECTED 04/29/2011 0901   AMPHETMU NONE DETECTED 04/29/2011 0901   THCU NONE DETECTED 04/29/2011 0901   LABBARB NONE DETECTED 04/29/2011 0901    Alcohol Level:  Lab 04/29/11 0856  ETH 113*   Urinalysis:  Lab 04/30/11 1609 04/29/11 0901  COLORURINE YELLOW YELLOW  LABSPEC 1.008 1.010  PHURINE 7.5 7.0  GLUCOSEU NEGATIVE NEGATIVE  HGBUR TRACE* MODERATE*  BILIRUBINUR NEGATIVE NEGATIVE  KETONESUR NEGATIVE NEGATIVE  PROTEINUR 30* 100*  UROBILINOGEN 0.2 1.0  NITRITE NEGATIVE NEGATIVE  LEUKOCYTESUR NEGATIVE NEGATIVE   Studies/Results: No results found. Medications: I have reviewed the patient's current medications. Scheduled Meds:    . aspirin EC  81 mg Oral Daily  . atorvastatin  40 mg Oral q1800  . clopidogrel  75 mg Oral Daily  . enoxaparin  30 mg Subcutaneous Q24H  . folic acid  1 mg Oral Daily  .  LORazepam  0-4 mg Oral Q6H   Followed by  . LORazepam  0-4 mg Oral Q12H  . mulitivitamin with minerals  1 tablet Oral Daily  . pantoprazole  40 mg Oral Q1200  . potassium chloride  40 mEq Oral Once  . potassium chloride  40 mEq Oral Q4H  . thiamine  100 mg Oral Daily   Or  . thiamine  100 mg Intravenous Daily   Continuous Infusions:  PRN Meds:.LORazepam, LORazepam, ondansetron   Assessment/Plan:   Alcohol withdrawal: Tachycardia slightly improving, hypertension improving.  Tremulous, tachycardic, hypertensive, all likely due to his alcohol withdrawal. It seems that he has been getting enough Ativan as he has not gotten worse than yesterday. Have discussed his case with CSW today to determine his options for rehab.  -Ativan: CIWA Protocol  -Thiamine and folic acid daily  -Elevated LFTs likely due to  alcoholism, but will check acute hepatitis panel  -Zofran PRN  -Protonix scheduled, Maalox PRN for heartburn  - Has a place at rehabilitation center- will likely discharge once they can accept.  CAD, History of PCI: 2007 Cath showed 95% mid LAD stenosis, stented with baremetal stent has been on Plavix since then. Also taking "1/3 of a full dose aspirin" at home. Simvastatin at home as well.  12 lead EKG to make sure he does not have demand ischemia in the midst of his withdrawal  ASA 81mg  daily  Continue Plavix daily as instructed by cardiologist who placed stent to continue plavix "indefinitely" Contiue Statin   HTN (hypertension): On no medication as outpatient. BPs 103-140s/80s-90s slightly improved, likely due to alcohol withdrawal improving. Will treat withdrawal with ativan and see how BP responds before starting any medication for BP. Clonidine would be a good choice in the acute withdrawal setting if pressures get too high.   HLD (hyperlipidemia): Lipid panel. Will continue statin.   DVT Prophylaxis: Lovenox     LOS: 3 days   Mikhai Bienvenue 05/02/2011, 2:37 PM

## 2011-05-02 NOTE — Progress Notes (Signed)
Call received from SW regarding pt d/c to [facility]. [facility] would like am labs drawn then will reevaluate placement with facility.  MD aware and CBC and BMP ordered for am (04/1710).  Will con't to monitor.

## 2011-05-02 NOTE — Progress Notes (Signed)
Pt asked to speak with RN. Upon entering room pt states he would like some "relief" and that he just wants to go "back to sleep".  PRN 1 mg PO Ativan given at 1130 and CIWA score was 6.  Will con't to monitor pt.

## 2011-05-02 NOTE — Progress Notes (Signed)
Weekend MSW Note:   Covering MSW faxed requested updated lab results to [facility] @  [phone].  MSW to will await return call from [facility] re: disposition.  Dionne Milo MSW [facility] [place] Emergency Dept. Weekend/Social Worker [phone]

## 2011-05-02 NOTE — Progress Notes (Signed)
Pt's [employer] sponsor, Su Grand was here last night and stated that he know pt very well and also the cause of his relapses. He said pt has agreed and is requesting for him to be part of care to offer suggestions so that his treatment course at the hospital will be a success.  He also wanted pt to sign a release HIPPA  form but pt was asleep and we could not verify this info.  Please MD, Mr Chestine Spore wants you to call him on this phone number [phone] so that he can help with .....Marland KitchenMarland KitchenPeace, D, rn

## 2011-05-02 NOTE — Progress Notes (Signed)
Weekend MSW Note:  Covering MSW placed call to [facility] intake @ [phone] and spoke to [employer] re: admit.  Per Arlys John, he has requested additional labs to be drawn before final decision can be determined. MSW spoke to pts bedside RN Irving Burton re: the above request who confirmed she would obtain an order and have the labs drawn. It was requested via [facility] once the labs are available to fax them for review @ [phone]. MSW placed call to pts wife Carlester Kasparek @ [phone] to provide her an update. Mrs. Foots voiced appreciation for the call and was accepting of the plan of care. At present, pt is with no confirmed disposition date. Covering MSW to con't to follow and address safe disposition plans.  Dionne Milo MSW [facility] Emergency Dept. Weekend/Social Worker [phone]

## 2011-05-02 NOTE — Progress Notes (Signed)
[facility] Weekend Covering MSW Note:  MSW received call from intake Arlys John at [place] re: admit.  Per [facility], they have reviewed pts labs and have requested to have pts am labs( 05/03/2011) faxed to them as they are concerned re: Mr. Degraffenreid Platelet Count therefore pt is denied to be placed today 2/2 his current lab values. Intake Arlys John stated if pts Platelet Count is with improvement, pt would be accepted for admit tomorrow.  MSW updated pts bedside RN Irving Burton on above who stated she would inform the physician. MSW informed pts wife Donyel Nester @ [phone] who is accepting of above plan.  At present, pt will not discharge to [facility]. Assigned MSW to f/u in the am and fax requested lab results to intake @ [phone]. Disposition plans are to be addressed in the am.   Dionne Milo MSW [facility] Emergency Dept. Weekend/Social Worker [phone]

## 2011-05-03 LAB — COMPREHENSIVE METABOLIC PANEL
AST: 388 U/L — ABNORMAL HIGH (ref 0–37)
BUN: 16 mg/dL (ref 6–23)
CO2: 29 mEq/L (ref 19–32)
Calcium: 8.5 mg/dL (ref 8.4–10.5)
Creatinine, Ser: 0.95 mg/dL (ref 0.50–1.35)
GFR calc Af Amer: >90 mL/min (ref >90)
GFR calc non Af Amer: 89 mL/min — ABNORMAL LOW (ref >90)
Glucose, Bld: 95 mg/dL (ref 70–99)

## 2011-05-03 LAB — CBC
MCH: 31.8 pg (ref 26.0–34.0)
MCV: 96.5 fL (ref 78.0–100.0)
Platelets: 80 10*3/uL — ABNORMAL LOW (ref 150–400)
RBC: 4.02 MIL/uL — ABNORMAL LOW (ref 4.22–5.81)

## 2011-05-03 LAB — HIV ANTIBODY (ROUTINE TESTING W REFLEX): HIV: NONREACTIVE

## 2011-05-03 MED ORDER — LORAZEPAM 0.5 MG PO TABS
0.0000 mg | ORAL_TABLET | Freq: Two times a day (BID) | ORAL | Status: DC
  Filled 2011-05-03: qty 4

## 2011-05-03 MED ORDER — THIAMINE HCL 100 MG PO TABS: 100.0000 mg | ORAL_TABLET | Freq: Every day | ORAL | Status: DC

## 2011-05-03 MED ORDER — ASPIRIN 81 MG PO TBEC: 81.0000 mg | DELAYED_RELEASE_TABLET | Freq: Every day | ORAL | Status: AC

## 2011-05-03 MED ORDER — FOLIC ACID 1 MG PO TABS: 1.0000 mg | ORAL_TABLET | Freq: Every day | ORAL | Status: AC

## 2011-05-03 NOTE — Progress Notes (Signed)
Subjective: Feeling well this morning.  Asleep and drowsy when awoken.  No overnight events.   Objective: Vital signs in last 24 hours: Filed Vitals:   05/02/11 1718 05/02/11 2112 05/02/11 2350 05/03/11 0647  BP: 143/96 141/91 143/90 143/88  Pulse: 115 93 105 100  Temp:  98.2 F (36.8 C)  99 F (37.2 C)  TempSrc:    Oral  Resp:  16  18  Height:      Weight:      SpO2: 98% 92%  93%   Weight change:   Intake/Output Summary (Last 24 hours) at 05/03/11 0913 Last data filed at 05/02/11 1912  Gross per 24 hour  Intake    360 ml  Output    225 ml  Net    135 ml   Physical Exam: General: drowsy, well-developed, and cooperative to examination.  Head: normocephalic and atraumatic.  Eyes: vision grossly intact, pupils equal, pupils round, pupils reactive to light, no injection and anicteric.  Neck: no JVD Lungs: normal respiratory effort, no accessory muscle use.  Heart: normal rate, regular rhythm, no murmur, no gallop, and no rub.  Msk: no joint swelling, no joint warmth, and no redness over joints.  Pulses: 2+ DP/PT pulses bilaterally  Neurologic: alert & oriented X3, cranial nerves II-XII intact, strength normal in all extremities, sensation intact to light touch. Not tremulous at all.  Skin: turgor normal and no rashes.      Lab Results: Basic Metabolic Panel:  Lab 05/03/11 2841 05/02/11 0630 04/29/11 1811  NA 135 132* --  K 3.7 3.4* --  CL 97 93* --  CO2 29 29 --  GLUCOSE 95 91 --  BUN 16 16 --  CREATININE 0.95 1.01 --  CALCIUM 8.5 8.4 --  MG -- -- 2.1  PHOS -- -- 3.9   Liver Function Tests:  Lab 05/03/11 0455 04/29/11 1811  AST 388* 844*  ALT 215* 325*  ALKPHOS 128* 141*  BILITOT 0.5 0.5  PROT 5.8* 5.7*  ALBUMIN 2.7* 2.9*   CBC:  Lab 05/03/11 0455 05/02/11 1237 04/29/11 0856  WBC 2.3* 2.5* --  NEUTROABS -- -- 1.0*  HGB 12.8* 13.3 --  HCT 38.8* 39.7 --  MCV 96.5 96.1 --  PLT 80* 87* --   Urine Drug Screen: Drugs of Abuse     Component  Value Date/Time   LABOPIA NONE DETECTED 04/29/2011 0901   COCAINSCRNUR NONE DETECTED 04/29/2011 0901   LABBENZ NONE DETECTED 04/29/2011 0901   AMPHETMU NONE DETECTED 04/29/2011 0901   THCU NONE DETECTED 04/29/2011 0901   LABBARB NONE DETECTED 04/29/2011 0901    Alcohol Level:  Lab 04/29/11 0856  ETH 113*   Urinalysis:  Lab 04/30/11 1609 04/29/11 0901  COLORURINE YELLOW YELLOW  LABSPEC 1.008 1.010  PHURINE 7.5 7.0  GLUCOSEU NEGATIVE NEGATIVE  HGBUR TRACE* MODERATE*  BILIRUBINUR NEGATIVE NEGATIVE  KETONESUR NEGATIVE NEGATIVE  PROTEINUR 30* 100*  UROBILINOGEN 0.2 1.0  NITRITE NEGATIVE NEGATIVE  LEUKOCYTESUR NEGATIVE NEGATIVE   Medications: I have reviewed the patient's current medications. Scheduled Meds:   . aspirin EC  81 mg Oral Daily  . atorvastatin  40 mg Oral q1800  . clopidogrel  75 mg Oral Daily  . enoxaparin  30 mg Subcutaneous Q24H  . folic acid  1 mg Oral Daily  . LORazepam      . LORazepam  0-4 mg Oral Q12H  . mulitivitamin with minerals  1 tablet Oral Daily  . pantoprazole  40 mg Oral Q1200  .  potassium chloride  40 mEq Oral Q4H  . thiamine  100 mg Oral Daily   Or  . thiamine  100 mg Intravenous Daily  . DISCONTD: LORazepam  0-4 mg Oral Q12H   Continuous Infusions:  PRN Meds:.LORazepam, LORazepam, ondansetron, DISCONTD: LORazepam   Assessment/Plan:  Alcohol withdrawal: Tachycardia nearly resolved.  BPs are ok.  Tremulousness resolved entirely.  Ready to stop ativan and go to rehab facility as soon as they can take him. -discontinue ativan -Thiamine and folic acid daily  -Elevated LFTs, which are likely due to alcoholism, are trending down.  Acute hepatitis panel negative -Zofran PRN  -Protonix scheduled, Maalox PRN for heartburn  -CSW working on dispo, possibly today  CAD, History of PCI: 2007 Cath showed 95% mid LAD stenosis, stented with baremetal stent has been on Plavix since then. Also taking "1/3 of a full dose aspirin" at home. Simvastatin at  home as well.  ASA 81mg  daily  Continue Plavix daily as instructed by cardiologist who placed stent to continue plavix "indefinitely"  Continue Statin   HTN (hypertension): BPs borderline high now.  Once he is done his detox he should be re-evaluated by a PCP for possible long term anti-hypertensive treatment.  DVT Prophylaxis: Lovenox    LOS: 4 days   Sean [name], [name] 05/03/2011, 9:13 AM

## 2011-05-03 NOTE — Progress Notes (Signed)
CSW received call from admissions RN, Toniann Fail, at [facility]. Toniann Fail reports pt's current labs not improved enough for admission to their program. Pt unable to be admitted until these labs show improvement. CSW will update MD, RN, and pt's wife.  Dellie Burns, MSW, [place] [phone] (weekend)

## 2011-05-03 NOTE — Progress Notes (Signed)
Discharge instructions prepared for pt review. Awaiting friend/family to arrive.  When pt friend/family arrived they were very upset that pt is being d/c and not being sent to a rehab facility.  Explained to them that inpt rehab will not accept pt d/t pt current lab levels and that medically there is no reason for pt to stay here.  Pt friend/family became angry and demanded to speak with MD and SW concerning this matter and said that pt cannot go home.  MD notified with MD call back stating he will come to bedside to speak with pt friend/family.

## 2011-05-03 NOTE — Progress Notes (Signed)
CSW faxed today's labs to [facility] @ [phone]. Awaiting return call from their admissions after review.  Dellie Burns, MSW, [place] [phone] (weekend)

## 2011-05-03 NOTE — Progress Notes (Signed)
MD spoke with pt friends/family.  D/c order cancelled and Psychology consult placed.  Will con't to monitor pt.

## 2011-05-03 NOTE — Progress Notes (Addendum)
General Medicine Intern:  Resident team had an in depth discussion with patients friends his AA sponsor Su Grand and friend Larita Fife whose background is as a Engineer, civil (consulting).  They are very concerned for patient's safety if he was to leave hospital today as was the plan.    They offer new information that patient's wife has filed for legal separation and will call the police if the patient returns home.  We were previously under the impression that patient was able to go home.  Patient currently has no place to go besides his Jeep, and he is not in a condition appropriate for driving.  The patient's friends also describe that the patient has a history of suicidal ideation during times where he is "down and out" and that patient told Mr. Chestine Spore that patient "wondered if he would be better off dead" at a point during current hospitalization.  Patient had clarified this same statement to [place] that he did say this but had no desire to hurt himself and had no thoughts of a method to do so.    Mr. Chestine Spore also described that patient has a history of reckless behavior and multiple falls related to alcohol.  He believes that the patient will try to drive his Jeep when he leaves, especially since living in his [place] would be his only place to live.    Patient's two friends here Renae Fickle and Larita Fife are very concerned that he will hurt himself, either intentionally or through recklessness, when he leaves if he is not placed into a rehab program directly from the hospital.     Patient has been amenable to rehab program placement during this admission.  However, our social work team has spent multiple days trying to achieve this.  Unfortunately, due to patient's thrombocytopenia, despite it being being stable, he was turned down by [facility].   Social work explained that they had no other inpatient options directly available from here that they have been able to find.  They gave patient a list of outpatient rehab programs.  Due to this new  information of patient having a history of recklessness and past suicidal ideation during difficult times, and the fact that his social situation now is worse than it ever has been, we have consulted psychiatry to come evaluate the patient for possible behavioral health admission.  Patient is very amenable to this.

## 2011-05-03 NOTE — Progress Notes (Signed)
Pt transferred to [id number] and telemetry removed per order.  Pt IV expires today however pt wants IV taken out and doesn't want to be stuck for new IV.  Will con't to monitor pt.

## 2011-05-04 DIAGNOSIS — F1020 Alcohol dependence, uncomplicated: Secondary | ICD-10-CM

## 2011-05-04 LAB — CBC
HCT: 41.8 % (ref 39.0–52.0)
Hemoglobin: 14.3 g/dL (ref 13.0–17.0)
MCH: 32.3 pg (ref 26.0–34.0)
MCHC: 34.2 g/dL (ref 30.0–36.0)
MCV: 94.4 fL (ref 78.0–100.0)
RDW: 13.2 % (ref 11.5–15.5)

## 2011-05-04 LAB — COMPREHENSIVE METABOLIC PANEL
ALT: 203 U/L — ABNORMAL HIGH (ref 0–53)
AST: 286 U/L — ABNORMAL HIGH (ref 0–37)
Alkaline Phosphatase: 132 U/L — ABNORMAL HIGH (ref 39–117)
GFR calc Af Amer: >90 mL/min (ref >90)
Glucose, Bld: 86 mg/dL (ref 70–99)
Potassium: 3.6 mEq/L (ref 3.5–5.1)
Sodium: 136 mEq/L (ref 135–145)
Total Protein: 6.8 g/dL (ref 6.0–8.3)

## 2011-05-04 NOTE — Evaluation (Signed)
Physical Therapy Evaluation Patient Details Name: Sean [name] MRN: [id number] DOB: 05/21/1950 Today's Date: 05/04/2011  Problem List:  Patient Active Problem List  Diagnoses  . Alcohol withdrawal  . HTN (hypertension)  . HLD (hyperlipidemia)    Past Medical History:  Past Medical History  Diagnosis Date  . Coronary artery disease     95% mid LAD s/p BMS of LAD, other non critical obstruction, put on plavix and aspirin 325  . HTN (hypertension)   . HLD (hyperlipidemia)   . Alcohol addiction     2010- admitted for withdrawal , detox admission after that   Past Surgical History:  Past Surgical History  Procedure Date  . Coronary stent placement     PT Assessment/Plan/Recommendation PT Assessment Clinical Impression Statement: pt admitted for detox from recent binge of ETOH.  Presently, returning toward baseline functioning.  Should be independent enough for residential rehab.  No further PT needs. Will have nursing and mobility team help keep pt mobile and in condition. PT Recommendation/Assessment: Patent does not need any further PT services No Skilled PT: Patient at baseline level of functioning PT Recommendation Follow Up Recommendations: No PT follow up Equipment Recommended: None recommended by PT PT Goals     PT Evaluation Precautions/Restrictions  Precautions Precautions: Fall (mild fall risk) Restrictions Weight Bearing Restrictions: No Prior Functioning  Home Living Lives With: Other (Comment) (Is separated and not welcome home--no home presently) Receives Help From: Friend(s) Prior Function Level of Independence: Independent with transfers;Independent with gait;Independent with basic ADLs Able to Take Stairs?: Yes Driving: Yes Vocation: Unemployed Cognition Cognition Arousal/Alertness: Awake/alert Overall Cognitive Status: Appears within functional limits for tasks assessed Orientation Level: Oriented X4 Sensation/Coordination Coordination Gross  Motor Movements are Fluid and Coordinated: Yes Extremity Assessment RUE Assessment RUE Assessment: Within Functional Limits LUE Assessment LUE Assessment: Within Functional Limits RLE Assessment RLE Assessment: Within Functional Limits LLE Assessment LLE Assessment: Within Functional Limits Mobility (including Balance) Bed Mobility Bed Mobility: Yes Supine to Sit: 7: Independent Sit to Supine: 7: Independent Transfers Transfers: Yes Sit to Stand: 7: Independent Stand to Sit: 7: Independent Ambulation/Gait Ambulation/Gait: Yes Ambulation/Gait Assistance: 5: Supervision Ambulation/Gait Assistance Details (indicate cue type and reason): for safety only Ambulation Distance (Feet): 400 Feet Assistive device: None Gait Pattern: Within Functional Limits Stairs: Yes Stairs Assistance: 6: Modified independent (Device/Increase time) Stair Management Technique: One rail Right;One rail Left;Alternating pattern;Forwards Number of Stairs: 6   Posture/Postural Control Posture/Postural Control: No significant limitations Balance Balance Assessed:  (steady gait) Exercise    End of Session PT - End of Session Activity Tolerance: Patient tolerated treatment well Patient left: in bed;with call bell in reach Nurse Communication: Mobility status for ambulation;Mobility status for transfers General Behavior During Session: [facility] for tasks performed Cognition: [facility] for tasks performed  Corrin Hingle, Eliseo Gum 05/04/2011, 3:36 PM  05/04/2011  Jim Hogg Bing, PT [phone] (pager)

## 2011-05-04 NOTE — Consult Note (Signed)
Reason for Consult:Alcohol Withdrawal Referring Physician: Dr. [name] is an 61 y.o. male.  HPI: Pt has been drinking for two months.  He had trained to drive trucks and says he developed a flashing light in S eye [ruled out retinal detachment] - does not elaborate on what the real problem was - except that he stopped driving and sat home for two months drinking Vodka and beer.  He came to ED for withdrawal.   AXIS I Alcohol dependence, multiple relapses AXIS II Deferred AXIS III Past Medical History  Diagnosis Date  . Coronary artery disease     95% mid LAD s/p BMS of LAD, other non critical obstruction, put on plavix and aspirin 325  . HTN (hypertension)   . HLD (hyperlipidemia)   . Alcohol addiction     2010- admitted for withdrawal , detox admission after that    Past Surgical History  Procedure Date  . Coronary stent placement   AXIS IV  Marital strife, Low self esteem, financial, housing, Parenting issues  AXIS V  GAF 40  History reviewed. No pertinent family history.  Social History:  reports that he has never smoked. He does not have any smokeless tobacco history on file. He reports that he drinks alcohol. He reports that he does not use illicit drugs.  Allergies: No Known Allergies  Medications: I have reviewed the patient's current medications.  Results for orders placed during the hospital encounter of 04/29/11 (from the past 48 hour(s))  CBC     Status: Abnormal   Collection Time   05/03/11  4:55 AM      Component Value Range Comment   WBC 2.3 (*) 4.0 - 10.5 (K/uL)    RBC 4.02 (*) 4.22 - 5.81 (MIL/uL)    Hemoglobin 12.8 (*) 13.0 - 17.0 (g/dL)    HCT 16.1 (*) 09.6 - 52.0 (%)    MCV 96.5  78.0 - 100.0 (fL)    MCH 31.8  26.0 - 34.0 (pg)    MCHC 33.0  30.0 - 36.0 (g/dL)    RDW 04.5  40.9 - 81.1 (%)    Platelets 80 (*) 150 - 400 (K/uL) CONSISTENT WITH PREVIOUS RESULT  COMPREHENSIVE METABOLIC PANEL     Status: Abnormal   Collection Time   05/03/11   4:55 AM      Component Value Range Comment   Sodium 135  135 - 145 (mEq/L)    Potassium 3.7  3.5 - 5.1 (mEq/L)    Chloride 97  96 - 112 (mEq/L)    CO2 29  19 - 32 (mEq/L)    Glucose, Bld 95  70 - 99 (mg/dL)    BUN 16  6 - 23 (mg/dL)    Creatinine, Ser 9.14  0.50 - 1.35 (mg/dL)    Calcium 8.5  8.4 - 10.5 (mg/dL)    Total Protein 5.8 (*) 6.0 - 8.3 (g/dL)    Albumin 2.7 (*) 3.5 - 5.2 (g/dL)    AST 782 (*) 0 - 37 (U/L)    ALT 215 (*) 0 - 53 (U/L)    Alkaline Phosphatase 128 (*) 39 - 117 (U/L)    Total Bilirubin 0.5  0.3 - 1.2 (mg/dL)    GFR calc non Af Amer 89 (*) >90 (mL/min)    GFR calc Af Amer >90  >90 (mL/min)   HIV ANTIBODY (ROUTINE TESTING)     Status: Normal   Collection Time   05/03/11  1:57 PM  Component Value Range Comment   HIV NON REACTIVE  NON REACTIVE    COMPREHENSIVE METABOLIC PANEL     Status: Abnormal   Collection Time   05/04/11  1:12 PM      Component Value Range Comment   Sodium 136  135 - 145 (mEq/L)    Potassium 3.6  3.5 - 5.1 (mEq/L)    Chloride 94 (*) 96 - 112 (mEq/L)    CO2 29  19 - 32 (mEq/L)    Glucose, Bld 86  70 - 99 (mg/dL)    BUN 13  6 - 23 (mg/dL)    Creatinine, Ser 1.61  0.50 - 1.35 (mg/dL)    Calcium 9.3  8.4 - 10.5 (mg/dL)    Total Protein 6.8  6.0 - 8.3 (g/dL)    Albumin 3.1 (*) 3.5 - 5.2 (g/dL)    AST 096 (*) 0 - 37 (U/L)    ALT 203 (*) 0 - 53 (U/L)    Alkaline Phosphatase 132 (*) 39 - 117 (U/L)    Total Bilirubin 0.7  0.3 - 1.2 (mg/dL)    GFR calc non Af Amer >90  >90 (mL/min)    GFR calc Af Amer >90  >90 (mL/min)   CBC     Status: Abnormal   Collection Time   05/04/11  1:12 PM      Component Value Range Comment   WBC 3.1 (*) 4.0 - 10.5 (K/uL)    RBC 4.43  4.22 - 5.81 (MIL/uL)    Hemoglobin 14.3  13.0 - 17.0 (g/dL)    HCT 04.5  40.9 - 81.1 (%)    MCV 94.4  78.0 - 100.0 (fL)    MCH 32.3  26.0 - 34.0 (pg)    MCHC 34.2  30.0 - 36.0 (g/dL)    RDW 91.4  78.2 - 95.6 (%)    Platelets 104 (*) 150 - 400 (K/uL) CONSISTENT WITH PREVIOUS  RESULT    No results found.  Review of Systems  Unable to perform ROS: other   Blood pressure 131/88, pulse 99, temperature 97 F (36.1 C), temperature source Oral, resp. rate 20, height 5\' 10"  (1.778 m), weight 80.7 kg (177 lb 14.6 oz), SpO2 96.00%. Physical Exam  Assessment/Plan:  Chart Reviewed  Psych CSW note reviewed Pt has had employment as Art gallery manager, Runner, broadcasting/film/video, truck driver.  He has a monotonous tone of voice, low pitched and circumstantial in speech.  He denies Suicidal ideation but says his wife has an Brewing technologist of $100.000.00 and states it would benefit others if he were dead.  He says he and wife are legally separated and wants a divorce.  He has children.  He has been drinking instead of going to his AA meetings.  He says he did not call his sponsor due to 'selfishness'.  He says he would plan to drive again in a 'buddy system' [Implortant to know if he stopped driving due to his alcoholism].  He expresses very low self esteem as his perception of self is 'worthless' (except dead).  He is encouraged to consider the loss everyone experiences when someone kills themselves. He rationalizes; intellectualizes his suicidal thoughts.  After reading prior notes of homelessness and loss of employment he is at high risk for suicide. RECOMMENDATION: 1.  Consider transfer to appropriate inpatient psychiatric facility for potential risk of suicide. 2. Consider initial Rx Revia 50 mg [if LFTs do not pose a contraindication] to minimize urge to drink 3. Will follow pt.  Sean [name] 05/04/2011, 7:54 PM

## 2011-05-04 NOTE — Progress Notes (Signed)
Subjective: Patient comfortable.  Walked across room slowly with only mild sensation of imbalance and no SOB.  No events overnight.  Last ativan dose 6:46am yesterday morning.    Objective: Vital signs in last 24 hours: Filed Vitals:   05/03/11 1020 05/03/11 1449 05/03/11 2100 05/04/11 0705  BP:  129/73 129/86 142/95  Pulse:  110 101 108  Temp:  98.7 F (37.1 C) 99.5 F (37.5 C) 97.2 F (36.2 C)  TempSrc:  Oral Oral Oral  Resp:  19 22 20   Height:      Weight: 182 lb 15.7 oz (83 kg)   177 lb 14.6 oz (80.7 kg)  SpO2:  95% 99% 96%   Weight change:   Intake/Output Summary (Last 24 hours) at 05/04/11 1333 Last data filed at 05/04/11 0900  Gross per 24 hour  Intake    580 ml  Output    375 ml  Net    205 ml   Physical Exam: General: drowsy, well-developed, and cooperative to examination.  Head: normocephalic and atraumatic.  Eyes: vision grossly intact, pupils equal, pupils round, pupils reactive to light, no injection and anicteric.  Neck: no JVD Lungs: normal respiratory effort, no accessory muscle use.  Heart: normal rate, regular rhythm, no murmur, no gallop, and no rub.  Msk: no joint swelling, no joint warmth, and no redness over joints.  Pulses: 2+ DP/PT pulses bilaterally   Neurologic: alert & oriented X3, cranial nerves II-XII intact, strength normal in all extremities, sensation intact to light touch. Not tremulous at all.  Walked across room and back with no gait abnormality.  Skin: turgor normal and no rashes.      Lab Results: Basic Metabolic Panel:  Lab 05/03/11 1610 05/02/11 0630 04/29/11 1811  NA 135 132* --  K 3.7 3.4* --  CL 97 93* --  CO2 29 29 --  GLUCOSE 95 91 --  BUN 16 16 --  CREATININE 0.95 1.01 --  CALCIUM 8.5 8.4 --  MG -- -- 2.1  PHOS -- -- 3.9   Liver Function Tests:  Lab 05/03/11 0455 04/29/11 1811  AST 388* 844*  ALT 215* 325*  ALKPHOS 128* 141*  BILITOT 0.5 0.5  PROT 5.8* 5.7*  ALBUMIN 2.7* 2.9*   CBC:  Lab 05/03/11  0455 05/02/11 1237 04/29/11 0856  WBC 2.3* 2.5* --  NEUTROABS -- -- 1.0*  HGB 12.8* 13.3 --  HCT 38.8* 39.7 --  MCV 96.5 96.1 --  PLT 80* 87* --   Urine Drug Screen: Drugs of Abuse     Component Value Date/Time   LABOPIA NONE DETECTED 04/29/2011 0901   COCAINSCRNUR NONE DETECTED 04/29/2011 0901   LABBENZ NONE DETECTED 04/29/2011 0901   AMPHETMU NONE DETECTED 04/29/2011 0901   THCU NONE DETECTED 04/29/2011 0901   LABBARB NONE DETECTED 04/29/2011 0901    Alcohol Level:  Lab 04/29/11 0856  ETH 113*   Urinalysis:  Lab 04/30/11 1609 04/29/11 0901  COLORURINE YELLOW YELLOW  LABSPEC 1.008 1.010  PHURINE 7.5 7.0  GLUCOSEU NEGATIVE NEGATIVE  HGBUR TRACE* MODERATE*  BILIRUBINUR NEGATIVE NEGATIVE  KETONESUR NEGATIVE NEGATIVE  PROTEINUR 30* 100*  UROBILINOGEN 0.2 1.0  NITRITE NEGATIVE NEGATIVE  LEUKOCYTESUR NEGATIVE NEGATIVE   Medications: I have reviewed the patient's current medications. Scheduled Meds:   . aspirin EC  81 mg Oral Daily  . atorvastatin  40 mg Oral q1800  . clopidogrel  75 mg Oral Daily  . enoxaparin  30 mg Subcutaneous Q24H  . folic acid  1 mg Oral Daily  . mulitivitamin with minerals  1 tablet Oral Daily  . pantoprazole  40 mg Oral Q1200  . thiamine  100 mg Oral Daily  . DISCONTD: thiamine  100 mg Intravenous Daily   Continuous Infusions:  PRN Meds:.ondansetron Assessment/Plan:   Alcohol withdrawal: Mildly tachycardic in 100s.  BPs looks good.  Not tremulous.  Gait looks good.  Done with ativan for over a day now.  Ready for discharge medically for his withdrawal, awaiting psychiatry evaluation rehab from a safety/social perspective. -Thiamine and folic acid daily  -Protonix scheduled, Maalox PRN for heartburn  -CSW working on dispo, psychiatry consult to see patient today  Thrombocytopenia: Stable, likely related to alcohol via bone marrow suppression and/or liver disease.  Will recheck CBC.   Elevated LFTs: Trending down.  Likely related to alcohol.   Hepatitis Viruses panel negative.  Will recheck CMET.     CAD, History of PCI: 2007 Cath showed 95% mid LAD stenosis, stented with baremetal stent has been on Plavix since then. Also taking "1/3 of a full dose aspirin" at home. Simvastatin at home as well.  ASA 81mg  daily  Continue Plavix daily as instructed by cardiologist who placed stent to continue plavix "indefinitely"  Continue Statin   HTN (hypertension): BPs good now on no antihypertensive therapy.  DVT Prophylaxis: Lovenox     LOS: 5 days   [name], [name] 05/04/2011, 1:33 PM

## 2011-05-04 NOTE — Progress Notes (Signed)
Clinical Social Work with Psychiatry  Following for inpatient rehab placement.  Patient still not accepted at any inpatient facility due to abnormal labs.  If we could please get updated lab work for today, it will be helpful in placement process.  Also, patient was referred to [facility] for potential review of clinicals for admission since patient has been there in the past.  CSW also explored option of inpatient at [facility], however wait list for initial appointment would not be until April 4th and then patient placed on wait list after that.  Other option would be [facility]  If patient has a dual diagnosis and Psych has been consulted to review patient and see if appropriate.  If declined from [facility], patient may be a good candidate for [facility] which is the state hospital for treatment, but would think patient would be placed on a wait list for treatment as well.  Will begin that process and fill out State legal documents in efforts to get patient reviewed, while awaiting psych consult.  Will follow up.  Ashley Jacobs, MSW LCSW [phone]

## 2011-05-04 NOTE — Progress Notes (Signed)
Internal Medicine Attending  Date: 05/04/2011  Patient name: Sean [name] Medical record number: [id number] Date of birth: 05/08/1950 Age: 61 y.o. Gender: male  I saw and evaluated the patient. I reviewed the case with Drs. Patel and [employer].  Sean [name] was seen soon after working with physical therapy. He was noted to have decent strength but was slightly wobbly. We are continuing to work on placement issues and are awaiting psychiatry evaluation and recommendations.

## 2011-05-05 ENCOUNTER — Inpatient Hospital Stay (HOSPITAL_COMMUNITY)
Admission: AD | Admit: 2011-05-05 | Discharge: 2011-05-11 | DRG: 897 | Disposition: A | Discharge status: Home / Self Care | Payer: PRIVATE HEALTH INSURANCE | Source: Ambulatory Visit | Attending: Psychiatry | Admitting: Psychiatry

## 2011-05-05 ENCOUNTER — Telehealth (HOSPITAL_COMMUNITY): Payer: Self-pay | Admitting: *Deleted

## 2011-05-05 ENCOUNTER — Encounter (HOSPITAL_COMMUNITY): Payer: Self-pay | Admitting: *Deleted

## 2011-05-05 DIAGNOSIS — R7401 Elevation of levels of liver transaminase levels: Secondary | ICD-10-CM

## 2011-05-05 DIAGNOSIS — R45851 Suicidal ideations: Secondary | ICD-10-CM

## 2011-05-05 DIAGNOSIS — R7402 Elevation of levels of lactic acid dehydrogenase (LDH): Secondary | ICD-10-CM

## 2011-05-05 DIAGNOSIS — R000 Tachycardia, unspecified: Secondary | ICD-10-CM

## 2011-05-05 DIAGNOSIS — I10 Essential (primary) hypertension: Secondary | ICD-10-CM

## 2011-05-05 DIAGNOSIS — Z9861 Coronary angioplasty status: Secondary | ICD-10-CM

## 2011-05-05 DIAGNOSIS — F332 Major depressive disorder, recurrent severe without psychotic features: Secondary | ICD-10-CM

## 2011-05-05 DIAGNOSIS — D696 Thrombocytopenia, unspecified: Secondary | ICD-10-CM

## 2011-05-05 DIAGNOSIS — Z7982 Long term (current) use of aspirin: Secondary | ICD-10-CM

## 2011-05-05 DIAGNOSIS — F10239 Alcohol dependence with withdrawal, unspecified: Secondary | ICD-10-CM

## 2011-05-05 DIAGNOSIS — Z7902 Long term (current) use of antithrombotics/antiplatelets: Secondary | ICD-10-CM

## 2011-05-05 DIAGNOSIS — F1020 Alcohol dependence, uncomplicated: Principal | ICD-10-CM

## 2011-05-05 DIAGNOSIS — R748 Abnormal levels of other serum enzymes: Secondary | ICD-10-CM

## 2011-05-05 DIAGNOSIS — I2510 Atherosclerotic heart disease of native coronary artery without angina pectoris: Secondary | ICD-10-CM

## 2011-05-05 DIAGNOSIS — E785 Hyperlipidemia, unspecified: Secondary | ICD-10-CM

## 2011-05-05 HISTORY — DX: Depression, unspecified: F32.A

## 2011-05-05 HISTORY — DX: Major depressive disorder, single episode, unspecified: F32.9

## 2011-05-05 MED ORDER — CHLORDIAZEPOXIDE HCL 25 MG PO CAPS: 25.0000 mg | ORAL_CAPSULE | ORAL | Status: DC

## 2011-05-05 MED ORDER — ATORVASTATIN CALCIUM 40 MG PO TABS
40.0000 mg | ORAL_TABLET | Freq: Every day | ORAL | Status: DC
  Administered 2011-05-06 – 2011-05-10 (×5): 40 mg via ORAL
  Filled 2011-05-05 (×7): qty 1

## 2011-05-05 MED ORDER — CHLORDIAZEPOXIDE HCL 25 MG PO CAPS
25.0000 mg | ORAL_CAPSULE | Freq: Four times a day (QID) | ORAL | Status: DC
  Administered 2011-05-05 – 2011-05-06 (×2): 25 mg via ORAL
  Filled 2011-05-05 (×2): qty 1

## 2011-05-05 MED ORDER — PANTOPRAZOLE SODIUM 40 MG PO TBEC
40.0000 mg | DELAYED_RELEASE_TABLET | Freq: Every day | ORAL | Status: DC
  Administered 2011-05-06 – 2011-05-10 (×5): 40 mg via ORAL
  Filled 2011-05-05 (×6): qty 1
  Filled 2011-05-05: qty 14

## 2011-05-05 MED ORDER — ADULT MULTIVITAMIN W/MINERALS CH
1.0000 | ORAL_TABLET | Freq: Every day | ORAL | Status: DC
  Administered 2011-05-06 – 2011-05-11 (×6): 1 via ORAL
  Filled 2011-05-05 (×6): qty 1
  Filled 2011-05-05: qty 14
  Filled 2011-05-05: qty 1

## 2011-05-05 MED ORDER — ACETAMINOPHEN 325 MG PO TABS: 650.0000 mg | ORAL_TABLET | Freq: Four times a day (QID) | ORAL | Status: DC | PRN

## 2011-05-05 MED ORDER — THIAMINE HCL 100 MG/ML IJ SOLN: 100.0000 mg | Freq: Once | INTRAMUSCULAR | Status: DC

## 2011-05-05 MED ORDER — CLOPIDOGREL BISULFATE 75 MG PO TABS
75.0000 mg | ORAL_TABLET | Freq: Every day | ORAL | Status: DC
  Administered 2011-05-06 – 2011-05-11 (×6): 75 mg via ORAL
  Filled 2011-05-05 (×4): qty 1
  Filled 2011-05-05: qty 14
  Filled 2011-05-05 (×3): qty 1

## 2011-05-05 MED ORDER — VITAMIN B-1 100 MG PO TABS
100.0000 mg | ORAL_TABLET | Freq: Every day | ORAL | Status: DC
  Administered 2011-05-06 – 2011-05-11 (×6): 100 mg via ORAL
  Filled 2011-05-05 (×8): qty 1

## 2011-05-05 MED ORDER — LOPERAMIDE HCL 2 MG PO CAPS: 2.0000 mg | ORAL_CAPSULE | ORAL | Status: AC | PRN

## 2011-05-05 MED ORDER — FOLIC ACID 1 MG PO TABS
1.0000 mg | ORAL_TABLET | Freq: Every day | ORAL | Status: DC
  Administered 2011-05-06 – 2011-05-11 (×6): 1 mg via ORAL
  Filled 2011-05-05: qty 14
  Filled 2011-05-05 (×7): qty 1

## 2011-05-05 MED ORDER — CHLORDIAZEPOXIDE HCL 25 MG PO CAPS
50.0000 mg | ORAL_CAPSULE | Freq: Once | ORAL | Status: AC
  Administered 2011-05-05: 50 mg via ORAL
  Filled 2011-05-05: qty 2

## 2011-05-05 MED ORDER — ASPIRIN EC 81 MG PO TBEC
81.0000 mg | DELAYED_RELEASE_TABLET | Freq: Every day | ORAL | Status: DC
  Administered 2011-05-06 – 2011-05-11 (×6): 81 mg via ORAL
  Filled 2011-05-05 (×4): qty 1
  Filled 2011-05-05: qty 14
  Filled 2011-05-05 (×3): qty 1

## 2011-05-05 MED ORDER — ALUM & MAG HYDROXIDE-SIMETH 200-200-20 MG/5ML PO SUSP: 30.0000 mL | ORAL | Status: DC | PRN

## 2011-05-05 MED ORDER — CHLORDIAZEPOXIDE HCL 25 MG PO CAPS: 25.0000 mg | ORAL_CAPSULE | Freq: Three times a day (TID) | ORAL | Status: DC

## 2011-05-05 MED ORDER — HYDROXYZINE HCL 25 MG PO TABS: 25.0000 mg | ORAL_TABLET | Freq: Four times a day (QID) | ORAL | Status: DC | PRN

## 2011-05-05 MED ORDER — CHLORDIAZEPOXIDE HCL 25 MG PO CAPS: 25.0000 mg | ORAL_CAPSULE | Freq: Every day | ORAL | Status: DC

## 2011-05-05 MED ORDER — CHLORDIAZEPOXIDE HCL 25 MG PO CAPS: 25.0000 mg | ORAL_CAPSULE | Freq: Four times a day (QID) | ORAL | Status: DC | PRN

## 2011-05-05 MED ORDER — MAGNESIUM HYDROXIDE 400 MG/5ML PO SUSP: 30.0000 mL | Freq: Every day | ORAL | Status: DC | PRN

## 2011-05-05 MED ORDER — ADULT MULTIVITAMIN W/MINERALS CH
1.0000 | ORAL_TABLET | Freq: Every day | ORAL | Status: DC
  Administered 2011-05-06: 1 via ORAL
  Filled 2011-05-05 (×2): qty 1

## 2011-05-05 MED ORDER — ONDANSETRON 4 MG PO TBDP: 4.0000 mg | ORAL_TABLET | Freq: Four times a day (QID) | ORAL | Status: AC | PRN

## 2011-05-05 NOTE — Progress Notes (Signed)
Clinical Social Work with Psych Services:  Patient has been medically accepted to [facility] this evening (no bed available at this time , but this evening will have a bed).  Patient will go voluntarily and family notified of dc plan along with two sponsors who were overjoyed.  Patient also have authorization to [facility] in which if still needing treatment after [facility] he can go.    Will update treatment team and also have patient sign admission papers.  Will have evening CSW Pollyann Savoy complete discharge once bed available and have RN call report.    Will follow up.  Ashley Jacobs, MSW LCSW [phone]

## 2011-05-05 NOTE — BH Assessment (Signed)
Assessment Note   Sean [name] is an 61 y.o. male. Pt relapsed on alcohol 2 months ago drinking vodka and beer daily. Pt reports loosing job due to flashing light in his eye and this has resolved, but unclear what the problem was. Wife is filing for divorce and pt feels his only worth is dead,  (points out he has life insurance which would pay money to his wife). Pt has blunted affect, monotone voice, circumstantial speech, hopeless, helpless, no future plans. Pt at high risk of suicide. No HI, no psychosis now or in past. Accepted to inpatient hospital by Dr Allena Katz.  Axis I: Depressive Disorder NOS and Alcohol Dependence Axis II: Deferred Axis III:  Past Medical History  Diagnosis Date  . Coronary artery disease     95% mid LAD s/p BMS of LAD, other non critical obstruction, put on plavix and aspirin 325  . HTN (hypertension)   . HLD (hyperlipidemia)   . Alcohol addiction     2010- admitted for withdrawal , detox admission after that  . Mental disorder    Axis IV: economic problems, housing problems, occupational problems, other psychosocial or environmental problems and problems with primary support group Axis V: 21-30 behavior considerably influenced by delusions or hallucinations OR serious impairment in judgment, communication OR inability to function in almost all areas  Past Medical History:  Past Medical History  Diagnosis Date  . Coronary artery disease     95% mid LAD s/p BMS of LAD, other non critical obstruction, put on plavix and aspirin 325  . HTN (hypertension)   . HLD (hyperlipidemia)   . Alcohol addiction     2010- admitted for withdrawal , detox admission after that  . Mental disorder     Past Surgical History  Procedure Date  . Coronary stent placement     Family History: No family history on file.  Social History:  reports that he has never smoked. He does not have any smokeless tobacco history on file. He reports that he drinks alcohol. He reports that  he does not use illicit drugs.  Additional Social History:  Alcohol / Drug Use Pain Medications: not abusing Prescriptions: not abusing Over the Counter: not abusing History of alcohol / drug use?: Yes Allergies: No Known Allergies  Home Medications:  Medications Prior to Admission  Medication Dose Route Frequency Provider Last Rate Last Dose  . aspirin EC tablet 81 mg  81 mg Oral Daily Danley Danker, MD   81 mg at 05/05/11 1034  . atorvastatin (LIPITOR) tablet 40 mg  40 mg Oral q1800 Zollie Beckers, MD   40 mg at 05/04/11 1838  . clopidogrel (PLAVIX) tablet 75 mg  75 mg Oral Daily Zollie Beckers, MD   75 mg at 05/05/11 1034  . enoxaparin (LOVENOX) injection 30 mg  30 mg Subcutaneous Q24H Zollie Beckers, MD   30 mg at 05/04/11 2032  . folic acid (FOLVITE) tablet 1 mg  1 mg Oral Daily Arie Sabina Schinlever, PA   1 mg at 05/05/11 1034  . mulitivitamin with minerals tablet 1 tablet  1 tablet Oral Daily Zollie Beckers, MD   1 tablet at 05/05/11 1034  . ondansetron (ZOFRAN) tablet 4 mg  4 mg Oral Q8H PRN Arie Sabina Schinlever, PA      . pantoprazole (PROTONIX) EC tablet 40 mg  40 mg Oral Q1200 Zollie Beckers, MD   40 mg at 05/05/11 1141  . thiamine (VITAMIN B-1)  tablet 100 mg  100 mg Oral Daily Zollie Beckers, MD   100 mg at 05/05/11 1035  . DISCONTD: thiamine (B-1) injection 100 mg  100 mg Intravenous Daily Zollie Beckers, MD       Medications Prior to Admission  Medication Sig Dispense Refill  . aspirin EC 81 MG EC tablet Take 1 tablet (81 mg total) by mouth daily.      . folic acid (FOLVITE) 1 MG tablet Take 1 tablet (1 mg total) by mouth daily.  30 tablet  6  . thiamine 100 MG tablet Take 1 tablet (100 mg total) by mouth daily.  30 tablet  6    OB/GYN Status:  No LMP for male patient.  General Assessment Data Location of Assessment: [facility] Assessment Services Living Arrangements: Spouse/significant other Can pt return to current living arrangement?: Yes Admission  Status: Voluntary Is patient capable of signing voluntary admission?: Yes Transfer from: Acute Hospital Referral Source: MD  Education Status Is patient currently in school?: No Contact person: Cyncere Sontag  separated wife [phone])  Risk to self Suicidal Ideation: Yes-Currently Present Suicidal Intent: Yes-Currently Present Is patient at risk for suicide?: Yes Suicidal Plan?: Yes-Currently Present Specify Current Suicidal Plan: alcohol Access to Means: Yes Specify Access to Suicidal Means: alcohol What has been your use of drugs/alcohol within the last 12 months?: alcohol Previous Attempts/Gestures: No How many times?: 0  Triggers for Past Attempts: None known Intentional Self Injurious Behavior: None Family Suicide History: No Recent stressful life event(s): Divorce;Financial Problems;Job Loss (wife filing for divorce) Persecutory voices/beliefs?: No Depression: Yes Depression Symptoms: Despondent;Fatigue;Guilt;Loss of interest in usual pleasures;Feeling worthless/self pity Substance abuse history and/or treatment for substance abuse?: Yes Suicide prevention information given to non-admitted patients: Not applicable  Risk to Others Homicidal Ideation: No Thoughts of Harm to Others: No Current Homicidal Intent: No Current Homicidal Plan: No Access to Homicidal Means: No Identified Victim: none History of harm to others?: No Assessment of Violence: None Noted Does patient have access to weapons?: No  Psychosis Hallucinations: None noted Delusions: None noted  Mental Status Report Appear/Hygiene:  (in hospital since 04/29/2011) Eye Contact: Fair Motor Activity: Unremarkable Speech: Slow;Soft;Logical/coherent Level of Consciousness: Quiet/awake Mood: Depressed;Guilty;Helpless;Worthless, low self-esteem Affect: Blunted;Depressed Anxiety Level: None Thought Processes: Coherent;Relevant Judgement: Impaired Orientation: Person;Place;Time;Situation Obsessive  Compulsive Thoughts/Behaviors: None  Cognitive Functioning Concentration: Decreased Memory: Recent Intact;Remote Intact IQ: Average Insight: Poor Impulse Control: Poor Appetite: Fair Weight Loss: 0  Weight Gain: 0  Sleep: No Change Total Hours of Sleep: 8  Vegetative Symptoms: None  Prior Inpatient Therapy Prior Inpatient Therapy: Yes Prior Therapy Dates: 2010 and 2004 [facility]ilty[facility] Reason for Treatment: detox and SA treatment  Prior Outpatient Therapy Prior Outpatient Therapy: Yes Prior Therapy Dates: 2010 and 2004 and ongoing  [facility] Reason for Treatment: Alcohol Dep                     Additional Information 1:1 In Past 12 Months?: No CIRT Risk: No Elopement Risk: No Does patient have medical clearance?: Yes     Disposition:  Disposition Disposition of Patient: Inpatient treatment program Type of inpatient treatment program: Adult  On Site Evaluation by:   Reviewed with Physician:     Conan Bowens 05/05/2011 2:38 PM

## 2011-05-05 NOTE — Progress Notes (Signed)
CSW arranged [facility] tx [facility].  Pt agreeable to tx.

## 2011-05-05 NOTE — Progress Notes (Signed)
Called by the care-link nurse regarding concern of Sean [name] persistently high heart rate. She had communicated the HR of 130 to the behavioral health intake nurse who refused the transfer after discussion with the on call physician. The care link wanted to know the plan of action.   After reviewing the entire hospital stay chart and his previous admission in 2010, it seemed to me that Sean [name] has always had normal sinus tachycardia. I called up the intake nurse at [facility] and told him about my assessment. He agreed that patient is not in active withdrawal given that his BP is stable. He told me that he will talk to the physician on call and discuss the transfer. I gave him my pager number if there are any concerns. The care link nurse told me that they will move ahead to the next patient in line for transfer as they cannot wait till the decision by behavioral health.

## 2011-05-05 NOTE — Discharge Summary (Signed)
Internal Medicine Teaching [facility] Discharge Note  Name: Sean [name] MRN: [id number] DOB: 12/09/1950 61 y.o.  Date of Admission: 04/29/2011  7:38 AM Date of Discharge: 05/05/2011 Attending Physician: Rocco Serene, MD  Discharge Diagnosis:  Suicidal Risk Alcohol withdrawal /Detoxification Elevated Transaminases Thrombocytopenia HTN (hypertension) HLD (hyperlipidemia) CAD s/p coronary stenting 2007  Discharge Medications: Medication List  As of 05/05/2011  2:36 PM   STOP taking these medications         aspirin 325 MG tablet      atorvastatin 40 MG tablet      enoxaparin 30 MG/0.3ML injection      pantoprazole 40 MG tablet      simvastatin 80 MG tablet         TAKE these medications         aspirin 81 MG EC tablet   Take 1 tablet (81 mg total) by mouth daily.      clopidogrel 75 MG tablet   Commonly known as: PLAVIX   Take 75 mg by mouth daily.      folic acid 1 MG tablet   Commonly known as: FOLVITE   Take 1 tablet (1 mg total) by mouth daily.      mulitivitamin with minerals Tabs   Take 1 tablet by mouth daily.      thiamine 100 MG tablet   Take 1 tablet (100 mg total) by mouth daily.      vitamin C 500 MG tablet   Commonly known as: ASCORBIC ACID   Take 500 mg by mouth daily.            Disposition and follow-up:   Sean [name] was discharged from [facility] in Stable condition.  He is to be transferred to [facility] for his psychiatric needs.  Upon completion of his psychiatric treatment, he will need a primary care physician.  He was given [facility] number on discharge to call to see which primary care physicians in the area will accept him.  Follow-up Appointments: Follow-up Information    Follow up with [facility]. (Please call to find a physician that accepts your  insurance.   Or call  toll free number on your insurance card to request  a list of physicians near you.   Please make an  appt to see that doctor within 2 weeks)    Contact information:   [phone]        Discharge Orders    Future Appointments: Provider: Department: Dept Phone: Center:   08/07/2011 7:30 AM Sherrie George, MD [facility] [phone] None     Future Orders Please Complete By Expires   Diet - low sodium heart healthy      Discharge instructions      Comments:   Please call [facility] number given to get a new doctor.  We are stopping your cholesterol medicine simvastatin for now because your liver function tests are abnormal.  Please consult with your new doctor when you should restart your cholesterol medicine.   Activity as tolerated - No restrictions      Call MD for:  persistant nausea and vomiting      Call MD for:  difficulty breathing, headache or visual disturbances         Consultations:    Procedures Performed:  Dg Chest 2 View  04/29/2011  *RADIOLOGY REPORT*  Clinical Data: Short of breath and weakness.  CHEST - 2 VIEW  Comparison:  None.  Findings: Heart size is normal.  Mediastinal shadows are normal. The vascularity is normal.  Lungs are clear.  No effusions. Ordinary degenerative changes effect the spine.  IMPRESSION: No active cardiopulmonary disease.  Original Report Authenticated By: Thomasenia Sales, M.D.   Ct Head Wo Contrast  04/29/2011  *RADIOLOGY REPORT*  Clinical Data: Fall, nosebleed, alcohol withdrawal  CT HEAD WITHOUT CONTRAST  Technique:  Contiguous axial images were obtained from the base of the skull through the vertex without contrast.  Comparison: 06/03/2008  Findings: Diffuse brain atrophy evident without acute hemorrhage, definite infarction, mass lesion, midline shift, herniation, hydrocephalus, focal edema, or extra-axial fluid collection.  Gray- white matter differentiation maintained.  Cisterns patent. Cerebellar atrophy as well.  Orbits are symmetric.  Mastoids clear. Sinuses are clear except for chronic left maxillary sinus disease,  sinusitis not excluded.  IMPRESSION: Stable brain atrophy.  No acute intracranial process by noncontrast CT.  Left maxillary sinus disease.  Original Report Authenticated By: Judie Petit. Ruel Favors, M.D.   Admission HPI:  Mr. Holderman is a 61 yo man with history of heavy alcohol use for years, HTN, HLD, and CAD s/p PCI placement in 2007 who presents for alcohol detox and alcohol withdrawal. He has been drinking one bottle of vodka per day or 18-24 beers per day for the past two months. His last drink was at midnight last night. He reports sweats, anxiety, and shakes now.  He has detoxed in the past, including one time years ago when he went to [facility] in [place]. He has since quit drinking a couple times and relapsed. He usually goes to [employer] and has AA friends but he has not gone in two months. He has gone into withdrawal "countless times" before, but he has never had seizures. No history of GI bleeds. No known history of liver disease.  His diet has been poor these last two months as most of his calories have come from liquor/beer. He has had a lot of diarrhea over this time, 3-4 episodes per day.  No CP, SOB, urinary symptoms, nausea, vomiting, abdominal pain, or numbness/tingling anywhere.    Hospital Course by problem list:  Suicide Risk:  During hospitalization, it was determined that patient is likely at high risk of suicide.  His social situation has degraded significantly in the past two months.  He fell back into drinking quite heavily for two months prior to admission. 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  Per patient's good friend and AA sponsor, patient's wife has threatened to call the police if he tries to return home.  His friend and AA sponsor explain that patient has a history of suicidal ideation when withdrawing from alcohol or drinking heavily as he had been recently.  They also  explained that his social situation now is the worst it has ever been.  Patient consistently denied current suicidal ideation or any plan to the main treatment team, but he did tell a nurse early during this hospitalization that he would be better off dead.  Psychiatry evaluated the patient.  Per psychiatry team's note, patient denied current suicidal ideation, but then rationalized some suicidal thoughts.  He currently feels worthless having lost his job and having his marriage fail.  His has a $100,000 life insurance policy, and he explained that he would be worth more in death than he is alive.  Psychiatry recommended transfer  to inpatient psychiatric facility for potential risk of suicide.  Patient was accepted to [facility] and is being transferred there for treatment of his suicidal ideation and for alcohol detoxication.  Patient consented to this transfer voluntarily.  Family and his AA sponsor and friend were very pleased with this plan. Patient [facility] also [facility] if needed after his course at behavioral health.  Alcohol withdrawal /Detoxification: Patient presented on his own to [facility] asking for alcohol detoxification.  He has a long history of alcohol abuse over 40 years.  He had relapsed during the past year, and for the past two months had been drinking very heavily, a "bottle" of vodka per day or 18-24 beers per day.  His last drink was midnight on early morning of 04/29/11 per patient report.  He had an ethanol level of 113 mg/dL at 9am on 2/84/13.  When seen by admission team at noon that day, he was mild tremulous and very anxious over his impending withdrawal.  He was also tachycardic to 110s and mildly hypertensive to 160s/90s.  Ativan was started by our hospital's CIWA protocol.  By the next day 3/14 his tremors had improved, and by the day 3/15 after that onward his tremors were completely resolved.  His blood  pressures had normalized by evening of 04/30/11.  His tachycardia persisted but improved, rates between 100 and 110 on day of discharge.  Last dose of Ativan was 6:46am on 05/03/11.  For the next two days he was very comfortable with no definite signs of withdrawal.  PT evaluated him and deemed no physical therapy was necessary after discharge.  He was able to walk around his room comfortably.  He is being discharged/transferred to [facility] for suicidal ideation and alcohol detox.  Patient is also authorized for [facility]C at the state hospital if needed after his course at behavioral health.  Of note, patient has had a pretty significant socioeconomic decline in the last couple decades, with his alcohol use likely contributing.  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  He has just now lost that job as well.     Elevated Transaminases: AST 844, ALT 325, Alk Phos 141, T Bili 0.5 on admission.  Thought to be due to his heavy alcohol use, given AST/ALT ratio.  Hepatitis panel was negative (negative Hep B surface antigen, negative HCV antibody, negative Hep A IgM, negative Hep B C IgM).  By discharge transaminases had trended down significantly.  On day before discharge, AST 286, ALT 203, Alk Phos 132, T Bili 0.7.  Seems to be resolving.  Statin was held during hospitalization and on discharge.  Should be restarted once LFTs normalize fully as outpatient.      Thrombocytopenia: Platelets were 88 on admission.  Thought to be due to his alcoholic liver disease vs possible bone marrow suppression due to alcohol given low white cell count 1.5 on admission as well.  Last CBC before discharge showed platelets had increased to 104 and WBC increased to 3.1 after 6 days with no alcohol.  HTN (hypertension): On  admission and during first couple days of hospitalization, pressures were 130s-160s systolic and 80s-90s diastolic.  This elevation was likely due to his alcohol withdrawal.  These normalized by discharge, systolics 124-142 / 86-95 diastolics in the two days before discharge.  Diagnosis of hypertension in his records but was not on any antihypertensives prior to admission and did not receive any in the hospital.  HLD (hyperlipidemia):  On Simvastatin 80mg  PO on admission.  LFTs were elevated on admission as per above, likely due to alcohol.  Statin was held and not continued on discharge.  PCP should re-initiate statin on follow-up once LFTs have completely normalized.  CAD s/p coronary stenting 2007: 2007 Cath showed 95% mid LAD stenosis, stented with baremetal stent and has been on Plavix since then. Also was taking "1/3 of a full dose aspirin" at home. Simvastatin at home as well.  A coronary artery aneurysm was found and part of the stent placed laid within the aneurysm, so the cardiologist who performed stenting suggested indefinite Plavix plus aspirin therapy.  Statin was discontinued for now due to elevated LFTs, which were likely due to his alcohol use.       Discharge Vitals:  BP 124/88  Pulse 115  Temp(Src) 97.4 F (36.3 C) (Oral)  Resp 20  Ht 5\' 10"  (1.778 m)  Wt 177 lb 9.6 oz (80.559 kg)  BMI 25.48 kg/m2  SpO2 98%  Discharge Labs:  Admission on 04/29/2011  Component Date Value Range Status  . WBC (K/uL) 04/29/2011 1.5* 4.0-10.5 Final  . RBC (MIL/uL) 04/29/2011 4.29  4.22-5.81 Final  . Hemoglobin (g/dL) 16/11/9602 54.0  98.1-19.1 Final  . HCT (%) 04/29/2011 40.0  39.0-52.0 Final  . MCV (fL) 04/29/2011 93.2  78.0-100.0 Final  . MCH (pg) 04/29/2011 32.2  26.0-34.0 Final  . MCHC (g/dL) 47/82/9562 13.0  86.5-78.4 Final  . RDW (%) 04/29/2011 13.5  11.5-15.5 Final  . Platelets (K/uL) 04/29/2011 88* 150-400 Final   PLATELET COUNT CONFIRMED BY SMEAR  . Neutrophils Relative (%)  04/29/2011 69  43-77 Final  . Neutro Abs (K/uL) 04/29/2011 1.0* 1.7-7.7 Final  . Lymphocytes Relative (%) 04/29/2011 20  12-46 Final  . Lymphs Abs (K/uL) 04/29/2011 0.3* 0.7-4.0 Final  . Monocytes Relative (%) 04/29/2011 11  3-12 Final  . Monocytes Absolute (K/uL) 04/29/2011 0.2  0.1-1.0 Final  . Eosinophils Relative (%) 04/29/2011 0  0-5 Final  . Eosinophils Absolute (K/uL) 04/29/2011 0.0  0.0-0.7 Final  . Basophils Relative (%) 04/29/2011 1  0-1 Final  . Basophils Absolute (K/uL) 04/29/2011 0.0  0.0-0.1 Final  . Sodium (mEq/L) 04/29/2011 134* 135-145 Final  . Potassium (mEq/L) 04/29/2011 3.1* 3.5-5.1 Final  . Chloride (mEq/L) 04/29/2011 91* 96-112 Final  . CO2 (mEq/L) 04/29/2011 31  19-32 Final  . Glucose, Bld (mg/dL) 69/62/9528 99  41-32 Final  . BUN (mg/dL) 44/02/270 10  5-36 Final  . Creatinine, Ser (mg/dL) 64/40/3474 2.59  5.63-8.75 Final  . Calcium (mg/dL) 64/33/2951 8.3* 8.8-41.6 Final  . GFR calc non Af Amer (mL/min) 04/29/2011 88* >90 Final  . GFR calc Af Amer (mL/min) 04/29/2011 >90  >90 Final   Comment:                                 The eGFR has been calculated                          using the CKD EPI equation.  This calculation has not been                          validated in all clinical                          situations.                          eGFR's persistently                          <90 mL/min signify                          possible Chronic Kidney Disease.  Marland Kitchen Alcohol, Ethyl (B) (mg/dL) 16/11/9602 540* 9-81 Final   Comment:                                 LOWEST DETECTABLE LIMIT FOR                          SERUM ALCOHOL IS 11 mg/dL                          FOR MEDICAL PURPOSES ONLY  . Opiates  04/29/2011 NONE DETECTED  NONE DETECTED Final  . Cocaine  04/29/2011 NONE DETECTED  NONE DETECTED Final  . Benzodiazepines  04/29/2011 NONE DETECTED  NONE DETECTED Final  . Amphetamines  04/29/2011 NONE DETECTED  NONE DETECTED Final    . Tetrahydrocannabinol  04/29/2011 NONE DETECTED  NONE DETECTED Final  . Barbiturates  04/29/2011 NONE DETECTED  NONE DETECTED Final   Comment:                                 DRUG SCREEN FOR MEDICAL PURPOSES                          ONLY.  IF CONFIRMATION IS NEEDED                          FOR ANY PURPOSE, NOTIFY LAB                          WITHIN 5 DAYS.                                                          LOWEST DETECTABLE LIMITS                          FOR URINE DRUG SCREEN                          Drug Class       Cutoff (ng/mL)                          Amphetamine  1000                          Barbiturate      200                          Benzodiazepine   200                          Tricyclics       300                          Opiates          300                          Cocaine          300                          THC              50  . Color, Urine  04/29/2011 YELLOW  YELLOW Final  . APPearance  04/29/2011 CLEAR  CLEAR Final  . Specific Gravity, Urine  04/29/2011 1.010  1.005-1.030 Final  . pH  04/29/2011 7.0  5.0-8.0 Final  . Glucose, UA (mg/dL) 16/11/9602 NEGATIVE  NEGATIVE Final  . Hgb urine dipstick  04/29/2011 MODERATE* NEGATIVE Final  . Bilirubin Urine  04/29/2011 NEGATIVE  NEGATIVE Final  . Ketones, ur (mg/dL) 54/10/8117 NEGATIVE  NEGATIVE Final  . Protein, ur (mg/dL) 14/78/2956 213* NEGATIVE Final  . Urobilinogen, UA (mg/dL) 08/65/7846 1.0  9.6-2.9 Final  . Nitrite  04/29/2011 NEGATIVE  NEGATIVE Final  . Leukocytes, UA  04/29/2011 NEGATIVE  NEGATIVE Final  . Squamous Epithelial / LPF  04/29/2011 RARE  RARE Final  . WBC, UA (WBC/hpf) 04/29/2011 0-2  <3 Final  . RBC / HPF (RBC/hpf) 04/29/2011 0-2  <3 Final  . Bacteria, UA  04/29/2011 RARE  RARE Final  . Casts  04/29/2011 HYALINE CASTS* NEGATIVE Final  . Urine-Other  04/29/2011 MUCOUS PRESENT   Final  . Total Protein (g/dL) 52/84/1324 5.7* 4.0-1.0 Final  . Albumin (g/dL) 27/25/3664 2.9* 4.0-3.4 Final   . AST (U/L) 04/29/2011 844* 0-37 Final  . ALT (U/L) 04/29/2011 325* 0-53 Final  . Alkaline Phosphatase (U/L) 04/29/2011 141* 39-117 Final  . Total Bilirubin (mg/dL) 74/25/9563 0.5  8.7-5.6 Final  . Bilirubin, Direct (mg/dL) 43/32/9518 0.2  8.4-1.6 Final  . Indirect Bilirubin (mg/dL) 60/63/0160 0.3  1.0-9.3 Final  . Magnesium (mg/dL) 23/55/7322 2.1  0.2-5.4 Final  . Phosphorus (mg/dL) 27/07/2374 3.9  2.8-3.1 Final  . Sodium (mEq/L) 04/30/2011 131* 135-145 Final  . Potassium (mEq/L) 04/30/2011 3.1* 3.5-5.1 Final  . Chloride (mEq/L) 04/30/2011 91* 96-112 Final  . CO2 (mEq/L) 04/30/2011 32  19-32 Final  . Glucose, Bld (mg/dL) 51/76/1607 96  37-10 Final  . BUN (mg/dL) 62/69/4854 9  6-27 Final  . Creatinine, Ser (mg/dL) 03/50/0938 1.82  9.93-7.16 Final  . Calcium (mg/dL) 96/78/9381 8.3* 0.1-75.1 Final  . GFR calc non Af Amer (mL/min) 04/30/2011 90* >90 Final  . GFR calc Af Amer (mL/min) 04/30/2011 >90  >90 Final   Comment:  The eGFR has been calculated                          using the CKD EPI equation.                          This calculation has not been                          validated in all clinical                          situations.                          eGFR's persistently                          <90 mL/min signify                          possible Chronic Kidney Disease.  . WBC (K/uL) 04/30/2011 1.5* 4.0-10.5 Final  . RBC (MIL/uL) 04/30/2011 4.08* 4.22-5.81 Final  . Hemoglobin (g/dL) 86/57/8469 62.9  52.8-41.3 Final  . HCT (%) 04/30/2011 38.8* 39.0-52.0 Final  . MCV (fL) 04/30/2011 95.1  78.0-100.0 Final  . MCH (pg) 04/30/2011 31.9  26.0-34.0 Final  . MCHC (g/dL) 24/40/1027 25.3  66.4-40.3 Final  . RDW (%) 04/30/2011 13.6  11.5-15.5 Final  . Platelets (K/uL) 04/30/2011 79* 150-400 Final   PLATELET COUNT CONFIRMED BY SMEAR  . Hepatitis B Surface Ag  04/30/2011 NEGATIVE  NEGATIVE Final  . HCV Ab  04/30/2011 NEGATIVE  NEGATIVE Final  .  Hep A IgM  04/30/2011 NEGATIVE  NEGATIVE Final  . Hep B C IgM  04/30/2011 NEGATIVE  NEGATIVE Final   Comment: (NOTE)                          High levels of Hepatitis B Core IgM antibody are detectable                          during the acute stage of Hepatitis B. This antibody is used                          to differentiate current from past HBV infection.  . Color, Urine  04/30/2011 YELLOW  YELLOW Final  . APPearance  04/30/2011 CLEAR  CLEAR Final  . Specific Gravity, Urine  04/30/2011 1.008  1.005-1.030 Final  . pH  04/30/2011 7.5  5.0-8.0 Final  . Glucose, UA (mg/dL) 47/42/5956 NEGATIVE  NEGATIVE Final  . Hgb urine dipstick  04/30/2011 TRACE* NEGATIVE Final  . Bilirubin Urine  04/30/2011 NEGATIVE  NEGATIVE Final  . Ketones, ur (mg/dL) 38/75/6433 NEGATIVE  NEGATIVE Final  . Protein, ur (mg/dL) 29/51/8841 30* NEGATIVE Final  . Urobilinogen, UA (mg/dL) 66/07/3014 0.2  0.1-0.9 Final  . Nitrite  04/30/2011 NEGATIVE  NEGATIVE Final  . Leukocytes, UA  04/30/2011 NEGATIVE  NEGATIVE Final  . Squamous Epithelial / LPF  04/30/2011 RARE  RARE Final  . RBC / HPF (RBC/hpf) 04/30/2011 0-2  <3 Final  . Sodium (mEq/L) 05/01/2011 131* 135-145 Final  . Potassium (mEq/L) 05/01/2011 3.0* 3.5-5.1 Final  . Chloride (  mEq/L) 05/01/2011 89* 96-112 Final  . CO2 (mEq/L) 05/01/2011 30  19-32 Final  . Glucose, Bld (mg/dL) 40/98/1191 478* 29-56 Final  . BUN (mg/dL) 21/30/8657 12  8-46 Final  . Creatinine, Ser (mg/dL) 96/29/5284 1.32  4.40-1.02 Final  . Calcium (mg/dL) 72/53/6644 8.9  0.3-47.4 Final  . GFR calc non Af Amer (mL/min) 05/01/2011 89* >90 Final  . GFR calc Af Amer (mL/min) 05/01/2011 >90  >90 Final   Comment:                                 The eGFR has been calculated                          using the CKD EPI equation.                          This calculation has not been                          validated in all clinical                          situations.                          eGFR's  persistently                          <90 mL/min signify                          possible Chronic Kidney Disease.  . Sodium (mEq/L) 05/02/2011 132* 135-145 Final  . Potassium (mEq/L) 05/02/2011 3.4* 3.5-5.1 Final  . Chloride (mEq/L) 05/02/2011 93* 96-112 Final  . CO2 (mEq/L) 05/02/2011 29  19-32 Final  . Glucose, Bld (mg/dL) 25/95/6387 91  56-43 Final  . BUN (mg/dL) 32/95/1884 16  1-66 Final  . Creatinine, Ser (mg/dL) 08/16/1599 0.93  2.35-5.73 Final  . Calcium (mg/dL) 22/03/5425 8.4  0.6-23.7 Final  . GFR calc non Af Amer (mL/min) 05/02/2011 79* >90 Final  . GFR calc Af Amer (mL/min) 05/02/2011 >90  >90 Final   Comment:                                 The eGFR has been calculated                          using the CKD EPI equation.                          This calculation has not been                          validated in all clinical                          situations.                          eGFR's persistently                          <  90 mL/min signify                          possible Chronic Kidney Disease.  . WBC (K/uL) 05/02/2011 2.5* 4.0-10.5 Final  . RBC (MIL/uL) 05/02/2011 4.13* 4.22-5.81 Final  . Hemoglobin (g/dL) 40/98/1191 47.8  29.5-62.1 Final  . HCT (%) 05/02/2011 39.7  39.0-52.0 Final  . MCV (fL) 05/02/2011 96.1  78.0-100.0 Final  . MCH (pg) 05/02/2011 32.2  26.0-34.0 Final  . MCHC (g/dL) 30/86/5784 69.6  29.5-28.4 Final  . RDW (%) 05/02/2011 13.7  11.5-15.5 Final  . Platelets (K/uL) 05/02/2011 87* 150-400 Final   CONSISTENT WITH PREVIOUS RESULT  . WBC (K/uL) 05/03/2011 2.3* 4.0-10.5 Final  . RBC (MIL/uL) 05/03/2011 4.02* 4.22-5.81 Final  . Hemoglobin (g/dL) 13/24/4010 27.2* 53.6-64.4 Final  . HCT (%) 05/03/2011 38.8* 39.0-52.0 Final  . MCV (fL) 05/03/2011 96.5  78.0-100.0 Final  . MCH (pg) 05/03/2011 31.8  26.0-34.0 Final  . MCHC (g/dL) 03/47/4259 56.3  87.5-64.3 Final  . RDW (%) 05/03/2011 13.6  11.5-15.5 Final  . Platelets (K/uL) 05/03/2011 80* 150-400  Final   CONSISTENT WITH PREVIOUS RESULT  . Sodium (mEq/L) 05/03/2011 135  135-145 Final  . Potassium (mEq/L) 05/03/2011 3.7  3.5-5.1 Final  . Chloride (mEq/L) 05/03/2011 97  96-112 Final  . CO2 (mEq/L) 05/03/2011 29  19-32 Final  . Glucose, Bld (mg/dL) 32/95/1884 95  16-60 Final  . BUN (mg/dL) 63/02/6008 16  9-32 Final  . Creatinine, Ser (mg/dL) 35/57/3220 2.54  2.70-6.23 Final  . Calcium (mg/dL) 76/28/3151 8.5  7.6-16.0 Final  . Total Protein (g/dL) 73/71/0626 5.8* 9.4-8.5 Final  . Albumin (g/dL) 46/27/0350 2.7* 0.9-3.8 Final  . AST (U/L) 05/03/2011 388* 0-37 Final  . ALT (U/L) 05/03/2011 215* 0-53 Final  . Alkaline Phosphatase (U/L) 05/03/2011 128* 39-117 Final  . Total Bilirubin (mg/dL) 18/29/9371 0.5  6.9-6.7 Final  . GFR calc non Af Amer (mL/min) 05/03/2011 89* >90 Final  . GFR calc Af Amer (mL/min) 05/03/2011 >90  >90 Final   Comment:                                 The eGFR has been calculated                          using the CKD EPI equation.                          This calculation has not been                          validated in all clinical                          situations.                          eGFR's persistently                          <90 mL/min signify                          possible Chronic Kidney Disease.  Marland Kitchen HIV  05/03/2011 NON REACTIVE  NON REACTIVE Final  .  Sodium (mEq/L) 05/04/2011 136  135-145 Final  . Potassium (mEq/L) 05/04/2011 3.6  3.5-5.1 Final  . Chloride (mEq/L) 05/04/2011 94* 96-112 Final  . CO2 (mEq/L) 05/04/2011 29  19-32 Final  . Glucose, Bld (mg/dL) 45/40/9811 86  91-47 Final  . BUN (mg/dL) 82/95/6213 13  0-86 Final  . Creatinine, Ser (mg/dL) 57/84/6962 9.52  8.41-3.24 Final  . Calcium (mg/dL) 40/11/2723 9.3  3.6-64.4 Final  . Total Protein (g/dL) 03/47/4259 6.8  5.6-3.8 Final  . Albumin (g/dL) 75/64/3329 3.1* 5.1-8.8 Final  . AST (U/L) 05/04/2011 286* 0-37 Final  . ALT (U/L) 05/04/2011 203* 0-53 Final  . Alkaline Phosphatase  (U/L) 05/04/2011 132* 39-117 Final  . Total Bilirubin (mg/dL) 41/66/0630 0.7  1.6-0.1 Final  . GFR calc non Af Amer (mL/min) 05/04/2011 >90  >90 Final  . GFR calc Af Amer (mL/min) 05/04/2011 >90  >90 Final   Comment:                                 The eGFR has been calculated                          using the CKD EPI equation.                          This calculation has not been                          validated in all clinical                          situations.                          eGFR's persistently                          <90 mL/min signify                          possible Chronic Kidney Disease.  . WBC (K/uL) 05/04/2011 3.1* 4.0-10.5 Final  . RBC (MIL/uL) 05/04/2011 4.43  4.22-5.81 Final  . Hemoglobin (g/dL) 09/32/3557 32.2  02.5-42.7 Final  . HCT (%) 05/04/2011 41.8  39.0-52.0 Final  . MCV (fL) 05/04/2011 94.4  78.0-100.0 Final  . MCH (pg) 05/04/2011 32.3  26.0-34.0 Final  . MCHC (g/dL) 08/09/7626 31.5  17.6-16.0 Final  . RDW (%) 05/04/2011 13.2  11.5-15.5 Final  . Platelets (K/uL) 05/04/2011 104* 150-400 Final   CONSISTENT WITH PREVIOUS RESULT  ] SignedYaakov Guthrie, [name] 05/05/2011, 2:36 PM

## 2011-05-05 NOTE — Progress Notes (Signed)
Subjective: Patient resting comfortably this morning.  Psychiatry evaluated him last night.  Patient recognizes "I belong at behavioral health".  No overnight events.  Patient reports no problem walking around room using sink, restroom, etc.   Objective: Vital signs in last 24 hours: Filed Vitals:   05/04/11 0705 05/04/11 1428 05/04/11 2029 05/05/11 0454  BP: 142/95 131/88 128/92 135/86  Pulse: 108 99 105 105  Temp: 97.2 F (36.2 C) 97 F (36.1 C) 98.9 F (37.2 C) 98.5 F (36.9 C)  TempSrc: Oral  Oral Oral  Resp: 20 20 18 18   Height:      Weight: 177 lb 14.6 oz (80.7 kg)   177 lb 9.6 oz (80.559 kg)  SpO2: 96% 96% 99% 99%   Weight change: -5 lb 1.1 oz (-2.3 kg)  Intake/Output Summary (Last 24 hours) at 05/05/11 0814 Last data filed at 05/05/11 0145  Gross per 24 hour  Intake    940 ml  Output   1302 ml  Net   -362 ml   Physical Exam:  General: alert, well-developed, and cooperative to examination.  Head: normocephalic and atraumatic.  Eyes: vision grossly intact, pupils equal, pupils round, pupils reactive to light, no injection and anicteric.  Neck: no JVD Lungs: normal respiratory effort, no accessory muscle use.  Heart: normal rate, regular rhythm, no murmur, no gallop, and no rub.  Msk: no joint swelling, no joint warmth, and no redness over joints.  Pulses: 2+ DP/PT pulses bilaterally   Neurologic: alert & oriented X3, cranial nerves II-XII intact, strength normal in all extremities, sensation intact to light touch. Not tremulous at all.  Skin: turgor normal and no rashes.     Lab Results: Basic Metabolic Panel:  Lab 05/04/11 1191 05/03/11 0455 04/29/11 1811  NA 136 135 --  K 3.6 3.7 --  CL 94* 97 --  CO2 29 29 --  GLUCOSE 86 95 --  BUN 13 16 --  CREATININE 0.85 0.95 --  CALCIUM 9.3 8.5 --  MG -- -- 2.1  PHOS -- -- 3.9   Liver Function Tests:  Lab 05/04/11 1312 05/03/11 0455  AST 286* 388*  ALT 203* 215*  ALKPHOS 132* 128*  BILITOT 0.7 0.5    PROT 6.8 5.8*  ALBUMIN 3.1* 2.7*   CBC:  Lab 05/04/11 1312 05/03/11 0455 04/29/11 0856  WBC 3.1* 2.3* --  NEUTROABS -- -- 1.0*  HGB 14.3 12.8* --  HCT 41.8 38.8* --  MCV 94.4 96.5 --  PLT 104* 80* --   Urine Drug Screen: Drugs of Abuse     Component Value Date/Time   LABOPIA NONE DETECTED 04/29/2011 0901   COCAINSCRNUR NONE DETECTED 04/29/2011 0901   LABBENZ NONE DETECTED 04/29/2011 0901   AMPHETMU NONE DETECTED 04/29/2011 0901   THCU NONE DETECTED 04/29/2011 0901   LABBARB NONE DETECTED 04/29/2011 0901    Alcohol Level:  Lab 04/29/11 0856  ETH 113*   Urinalysis:  Lab 04/30/11 1609 04/29/11 0901  COLORURINE YELLOW YELLOW  LABSPEC 1.008 1.010  PHURINE 7.5 7.0  GLUCOSEU NEGATIVE NEGATIVE  HGBUR TRACE* MODERATE*  BILIRUBINUR NEGATIVE NEGATIVE  KETONESUR NEGATIVE NEGATIVE  PROTEINUR 30* 100*  UROBILINOGEN 0.2 1.0  NITRITE NEGATIVE NEGATIVE  LEUKOCYTESUR NEGATIVE NEGATIVE   Medications: I have reviewed the patient's current medications. Scheduled Meds:   . aspirin EC  81 mg Oral Daily  . atorvastatin  40 mg Oral q1800  . clopidogrel  75 mg Oral Daily  . enoxaparin  30 mg Subcutaneous Q24H  .  folic acid  1 mg Oral Daily  . mulitivitamin with minerals  1 tablet Oral Daily  . pantoprazole  40 mg Oral Q1200  . thiamine  100 mg Oral Daily  . DISCONTD: thiamine  100 mg Intravenous Daily   Continuous Infusions:  PRN Meds:.ondansetron Assessment/Plan:  Suicide Risk: Was seen by psychiatry yesterday.  Due to patient's homelessness, loss of employment, and discussion patient had with psychiatrist, there is concern that Mr. Curro is a high risk of suicide at this time if he leaves the hospital on his own.  Psychiatry has recommended placement into inpatient psychiatry.  Social work is working on this.  Patient is a great candidate for behavioral health at [facility].    Alcohol withdrawal: HRs in 90s-100s. BPs looks good. Not tremulous. Gait looks good. Done with ativan for  over two days now. Ready for discharge medically for his withdrawal.  Inpatient psychiatry placement pending.   -Thiamine and folic acid daily  -Protonix scheduled, Maalox PRN for heartburn    Thrombocytopenia: Improved to 104 on yesterday's CBC, likely related to alcohol via bone marrow suppression and/or liver disease.  Elevated LFTs: Continue to trend down. Likely related to alcohol. Hepatitis Viruses panel negative.   CAD, History of PCI: 2007 Cath showed 95% mid LAD stenosis, stented with baremetal stent has been on Plavix since then. Also taking "1/3 of a full dose aspirin" at home. Simvastatin at home as well.  ASA 81mg  daily  Continue Plavix daily as instructed by cardiologist who placed stent to continue plavix "indefinitely"  Continue Statin   HTN (hypertension): BPs good now on no antihypertensive therapy.   DVT Prophylaxis: Lovenox    LOS: 6 days   Sean [name], [name] 05/05/2011, 8:14 AM

## 2011-05-05 NOTE — Progress Notes (Signed)
Clinical Social Work with Psychiatry:  Reviewed Psych MD note and made appropriate referrals.  Sent completed referral with authorization number to [facility]: [facility] but referral will not be reviewed until tomorrow at the earliest 9-10am.  Also sent new lab work and updated progress notes to [facility] and a new referral with psych MD recommendations to [facility] for review and hopeful admission.  Will follow up with all referrals with regards to inpatient hospitalization for alcohol dependence and SI.  Currently has not been accepted at any of three listed facilities.  All in review.  Ashley Jacobs, MSW LCSW [phone]

## 2011-05-05 NOTE — Progress Notes (Signed)
Patient ID: CADE DASHNER, male   DOB: [birth year], 61 y.o.   MRN: [id number] The following medications are in locker #30: Simvastain 80mg , 2 bottles Clopidogrel IP 75mg  9 pills in one blister pack, 7 pills  In another blister pack Alka Seltzer 2 effervescent tabs in a blister pack One unmarked bottle of pills Benadryl 2 capsules in a blister pack Nature made vitamin C 500mg  1 bottle

## 2011-05-05 NOTE — Progress Notes (Signed)
Nursing Note: Pt to be transferred to room 4505. Nurse received report on 4500. Pt stable and has no complaints at this time. Receiving nurse concerned that pt may need sitter. Pt has not verbalized or shown signs of harming self or others. Pt has also been seen by Psych MD, no orders received for sitter at this time. Will transfer pt safely to room 4505. George Hugh RN

## 2011-05-05 NOTE — Progress Notes (Signed)
Internal Medicine Attending  Date: 05/05/2011  Patient name: Sean [name] Medical record number: [id number] Date of birth: 1950/05/12 Age: 61 y.o. Gender: male  I saw and evaluated the patient. I reviewed the resident's note by Dr. [name] and I agree with the resident's findings and plans as documented in his progress note.  [name] is without acute complaints today and was asking to walk about his room. He was seen by psychiatry yesterday and their recommendation was for transfer to inpatient psychiatry for further care. This has been arranged and I agree with the housestaff's plan to transfer him to a psychiatry inpatient unit today.

## 2011-05-06 ENCOUNTER — Other Ambulatory Visit: Payer: Self-pay

## 2011-05-06 ENCOUNTER — Encounter (HOSPITAL_COMMUNITY): Payer: Self-pay | Admitting: *Deleted

## 2011-05-06 DIAGNOSIS — R748 Abnormal levels of other serum enzymes: Secondary | ICD-10-CM | POA: Diagnosis present

## 2011-05-06 MED ORDER — LORAZEPAM 1 MG PO TABS
2.0000 mg | ORAL_TABLET | Freq: Four times a day (QID) | ORAL | Status: DC
  Administered 2011-05-06 – 2011-05-07 (×5): 2 mg via ORAL
  Filled 2011-05-06 (×5): qty 2

## 2011-05-06 NOTE — Tx Team (Signed)
Initial Interdisciplinary Treatment Plan  PATIENT STRENGTHS: (choose at least two) Ability for insight Active sense of humor Average or above average intelligence Capable of independent living Communication skills Financial means General fund of knowledge Motivation for treatment/growth Physical Health Supportive family/friends Work skills  PATIENT STRESSORS: Marital or family conflict Substance abuse   PROBLEM LIST: Problem List/Patient Goals Date to be addressed Date deferred Reason deferred Estimated date of resolution  ETOH abuse      Depression                                                 DISCHARGE CRITERIA:  Ability to meet basic life and health needs Adequate post-discharge living arrangements Improved stabilization in mood, thinking, and/or behavior Medical problems require only outpatient monitoring Motivation to continue treatment in a less acute level of care Need for constant or close observation no longer present Reduction of life-threatening or endangering symptoms to within safe limits Safe-care adequate arrangements made Verbal commitment to aftercare and medication compliance Withdrawal symptoms are absent or subacute and managed without 24-hour nursing intervention  PRELIMINARY DISCHARGE PLAN: Attend 12-step recovery group Outpatient therapy  PATIENT/FAMIILY INVOLVEMENT: This treatment plan has been presented to and reviewed with the patient, Sean [name].  The patient has been given the opportunity to ask questions and make suggestions.  Arturo Morton 05/06/2011, 12:53 AM

## 2011-05-06 NOTE — Progress Notes (Signed)
Resting quietly in bed with eyes closed. Respirations even and unlabored. No acute distress noted. Safety has been maintained with Q15 minute observation. Will continue current POC. 

## 2011-05-06 NOTE — Progress Notes (Signed)
Patient ID: Sean [name], male   DOB: [birth year], 61 y.o.   MRN: [id number]

## 2011-05-06 NOTE — H&P (Signed)
Psychiatric Admission Assessment Adult  Patient Identification:  Sean [name] Date of Evaluation:  05/06/2011 Chief Complaint:  Mood Disorder NOS History of Present Illness: Pt states that he has been drinking heavily for the past 2 months.  He had developed an eye problem and was out of work.  He states that his wife dropped him off at the hospital due to his increasing depression.  He notes that he has been more sad, feeling worthless, hopeless, helpless. He was admitted to [facility] for detox due to his significant health issues.  He is now transferred to [facility] due to his ongoing SI and depression. Past Psychiatric History: Diagnosis:   Hospitalizations: 2010 for detox  Outpatient Care: [facility]  Substance Abuse Care:  Self-Mutilation:  Suicidal Attempts: No  Violent Behaviors:  Past Medical History:   Past Medical History  Diagnosis Date  . Coronary artery disease     95% mid LAD s/p BMS of LAD, other non critical obstruction, put on plavix and aspirin 325  . HTN (hypertension)   . HLD (hyperlipidemia)   . Alcohol addiction     2010- admitted for withdrawal , detox admission after that  . Mental disorder   . Depression   Allergies:  No Known Allergies PTA Medications: Prescriptions prior to admission  Medication Sig Dispense Refill  . aspirin EC 81 MG EC tablet Take 1 tablet (81 mg total) by mouth daily.      . clopidogrel (PLAVIX) 75 MG tablet Take 75 mg by mouth daily.      . folic acid (FOLVITE) 1 MG tablet Take 1 tablet (1 mg total) by mouth daily.  30 tablet  6  . Multiple Vitamin (MULITIVITAMIN WITH MINERALS) TABS Take 1 tablet by mouth daily.      . simvastatin (ZOCOR) 80 MG tablet Take 80 mg by mouth every morning.      . thiamine 100 MG tablet Take 1 tablet (100 mg total) by mouth daily.  30 tablet  6  . vitamin C (ASCORBIC ACID) 500 MG tablet Take 500 mg by mouth daily.      Consequences of Substance Abuse: Increased   Social History: Current Place of  Residence:   Place of Birth:   Family Members: Marital Status:  Married Children:  Sons:  Daughters: Relationships: Education:  [employer] Problems/Performance: Religious Beliefs/Practices: History of Abuse (Emotional/Phsycial/Sexual) Teacher, music History:   Legal History: Hobbies/Interests:  Family History:   Family History  Problem Relation Age of Onset  . Anesthesia problems Neg Hx   . Hypotension Neg Hx   . Malignant hyperthermia Neg Hx   . Pseudochol deficiency Neg Hx   ROS: Negative CPE: Completed by MD while in patient. Record reviewed, patient evaluated, and I agree with those findings. Mental Status Examination/Evaluation: Objective:  Appearance: Casual  Eye Contact::  Fair  Speech:  Normal Rate  Volume:  Normal  Mood:  Depressed  Affect:  Appropriate  Thought Process:  Linear  Orientation:  Full  Thought Content:  WDL  Suicidal Thoughts:  No  Homicidal Thoughts:  No  Memory:  Recent;   Fair  Judgement:  Poor  Insight:  Lacking  Psychomotor Activity:  Decreased  Concentration:  Fair  Recall:  Poor  Akathisia:  No  Handed:    AIMS (if indicated):     Assets:  Desire for Improvement Social Support  Sleep:  Number of Hours: 4     Laboratory/X-Ray Psychological Evaluation(s)  Assessment:    AXIS I:  Alcohol dependence with MDD severe recurrent w/o psychotic features AXIS II:  Deferred AXIS III:   Past Medical History  Diagnosis Date  . Coronary artery disease     95% mid LAD s/p BMS of LAD, other non critical obstruction, put on plavix and aspirin 325  . HTN (hypertension)   . HLD (hyperlipidemia)   . Alcohol addiction     2010- admitted for withdrawal , detox admission after that  . Mental disorder   . Depression    AXIS IV:  occupational problems, problems related to social environment and problems with primary support group AXIS V:  51-60 moderate symptoms  Treatment Plan/Recommendations: Due to  elevated hepatic enzymes it is unlikely that he has cleared the alcohol completely.  Will change over to Ativan protocol and evaluated further.  Treatment Plan Summary: Daily contact with patient to assess and evaluate symptoms and progress in treatment Medication management Current Medications:  Current Facility-Administered Medications  Medication Dose Route Frequency Provider Last Rate Last Dose  . acetaminophen (TYLENOL) tablet 650 mg  650 mg Oral Q6H PRN Curlene Labrum Readling, MD      . alum & mag hydroxide-simeth (MAALOX/MYLANTA) 200-200-20 MG/5ML suspension 30 mL  30 mL Oral Q4H PRN Curlene Labrum Readling, MD      . aspirin EC tablet 81 mg  81 mg Oral Daily Curlene Labrum Readling, MD   81 mg at 05/06/11 1610  . atorvastatin (LIPITOR) tablet 40 mg  40 mg Oral QHS Randy D Readling, MD      . chlordiazePOXIDE (LIBRIUM) capsule 50 mg  50 mg Oral Once Ronny Bacon, MD   50 mg at 05/05/11 2358  . clopidogrel (PLAVIX) tablet 75 mg  75 mg Oral Q breakfast Curlene Labrum Readling, MD   75 mg at 05/06/11 9604  . folic acid (FOLVITE) tablet 1 mg  1 mg Oral Daily Curlene Labrum Readling, MD   1 mg at 05/06/11 5409  . loperamide (IMODIUM) capsule 2-4 mg  2-4 mg Oral PRN Curlene Labrum Readling, MD      . LORazepam (ATIVAN) tablet 2 mg  2 mg Oral Q6H Alyson Kuroski-Mazzei, DO   2 mg at 05/06/11 1209  . magnesium hydroxide (MILK OF MAGNESIA) suspension 30 mL  30 mL Oral Daily PRN Ronny Bacon, MD      . mulitivitamin with minerals tablet 1 tablet  1 tablet Oral Daily Curlene Labrum Readling, MD   1 tablet at 05/06/11 0800  . ondansetron (ZOFRAN-ODT) disintegrating tablet 4 mg  4 mg Oral Q6H PRN Curlene Labrum Readling, MD      . pantoprazole (PROTONIX) EC tablet 40 mg  40 mg Oral QHS Curlene Labrum Readling, MD      . thiamine (B-1) injection 100 mg  100 mg Intramuscular Once Curlene Labrum Readling, MD      . thiamine (VITAMIN B-1) tablet 100 mg  100 mg Oral Daily Curlene Labrum Readling, MD   100 mg at 05/06/11 8119  . DISCONTD: chlordiazePOXIDE (LIBRIUM) capsule  25 mg  25 mg Oral Q6H PRN Ronny Bacon, MD      . DISCONTD: chlordiazePOXIDE (LIBRIUM) capsule 25 mg  25 mg Oral QID Curlene Labrum Readling, MD   25 mg at 05/06/11 1478  . DISCONTD: chlordiazePOXIDE (LIBRIUM) capsule 25 mg  25 mg Oral TID Ronny Bacon, MD      . DISCONTD: chlordiazePOXIDE (LIBRIUM) capsule 25 mg  25 mg Oral BH-qamhs Randy  D Readling, MD      . DISCONTD: chlordiazePOXIDE (LIBRIUM) capsule 25 mg  25 mg Oral Daily Curlene Labrum Readling, MD      . DISCONTD: hydrOXYzine (ATARAX/VISTARIL) tablet 25 mg  25 mg Oral Q6H PRN Ronny Bacon, MD      . DISCONTD: mulitivitamin with minerals tablet 1 tablet  1 tablet Oral Daily Ronny Bacon, MD   1 tablet at 05/06/11 9147   Facility-Administered Medications Ordered in Other Encounters  Medication Dose Route Frequency Provider Last Rate Last Dose  . DISCONTD: aspirin EC tablet 81 mg  81 mg Oral Daily Danley Danker, MD   81 mg at 05/05/11 1034  . DISCONTD: atorvastatin (LIPITOR) tablet 40 mg  40 mg Oral q1800 Zollie Beckers, MD   40 mg at 05/05/11 1755  . DISCONTD: clopidogrel (PLAVIX) tablet 75 mg  75 mg Oral Daily Zollie Beckers, MD   75 mg at 05/05/11 1034  . DISCONTD: enoxaparin (LOVENOX) injection 30 mg  30 mg Subcutaneous Q24H Zollie Beckers, MD   30 mg at 05/04/11 2032  . DISCONTD: folic acid (FOLVITE) tablet 1 mg  1 mg Oral Daily Arie Sabina Schinlever, PA   1 mg at 05/05/11 1034  . DISCONTD: mulitivitamin with minerals tablet 1 tablet  1 tablet Oral Daily Zollie Beckers, MD   1 tablet at 05/05/11 1034  . DISCONTD: ondansetron (ZOFRAN) tablet 4 mg  4 mg Oral Q8H PRN Otilio Miu, PA      . DISCONTD: pantoprazole (PROTONIX) EC tablet 40 mg  40 mg Oral Q1200 Zollie Beckers, MD   40 mg at 05/05/11 1141  . DISCONTD: thiamine (VITAMIN B-1) tablet 100 mg  100 mg Oral Daily Zollie Beckers, MD   100 mg at 05/05/11 1035    Observation Level/Precautions:  Detox  Laboratory:  TSH, Vitamin D  Psychotherapy:      Medications:    Routine PRN Medications:  Yes  Consultations:    Discharge Concerns:    Other:     Lloyd Huger T. Satoya Feeley PAC For Dr. Lupe Carney 3/20/20134:39 PM

## 2011-05-06 NOTE — Progress Notes (Signed)
Pt attended discharge planning group and actively participated.  Pt presents with flat affect and depressed mood. Pt states that he was drinking heavily and checked himself into the hospital.  Pt states that he was there for 1 week due to being so sick from being an alcoholic.  Pt states that he's been drinking for years and went to [facility] 8 years ago.  Pt states that this kept him sober for 2 years.  Pt states that he was going to AA meetings up until a few months ago.  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  Pt states that he is getting a divorce and is unable to return to his home.  Pt states that he has family and friends as options for housing upon d/c.  Pt states that he is unsure if he wants to do inpatient or outpatient treatment from here.  SW will assess for appropriate referrals.  Pt denies having depression, anxiety and SI.  No further needs voiced by pt at this time.   Reyes Ivan, LCSWA 05/06/2011  9:41 AM

## 2011-05-06 NOTE — Progress Notes (Signed)
Clinical Social Work:    Patient was discharged on 3/19 over to [facility] for voluntary admission to address etoh abuse as well has depression/SI>  While at [facility] hospital patient was referred to [facility] for long term treatment that he and sponsors/family friends were in agreement and adamant about.  CSW received authorization from [facility] and faxed referral.  NP is calling today 3/20 for additional labs and requested fax in which CSW completed for possible admission.  Message was left for [facility] regarding placement as well as long term plan if patient still agreeable or needing treatment.  Have also made [facility] aware of admission and contact numbers if more questions arise.  No other needs from this provider.  Please call if needs arise or there are questions.  Ashley Jacobs, MSW LCSW [phone]

## 2011-05-06 NOTE — Progress Notes (Signed)
Pulse elevated @1700 .Some confusion noted. Pt. Thought it was 13th.Pleasant & cooperative. Continues on 15 minute checks. Pt. Safety maintained.

## 2011-05-06 NOTE — Progress Notes (Signed)
Patient ID: Sean [name], male   DOB: 1951-01-20, 61 y.o.   MRN: [id number] He has been  up and to AM group. Then went back to bed. Self inventory sleep poor,appitite improveing,energy low,attention improving no depression, not hopeless, Denies SI thoughts CWIA this AM was 3.

## 2011-05-06 NOTE — Progress Notes (Signed)
[facility] Group Notes:  (Counselor/Nursing/MHT/Case Management/Adjunct)  05/06/2011 4:07 PM  Type of Therapy:  Group Therapy  Participation Level:  Active  Participation Quality:  Drowsy, Monopolizing and Sharing  Affect:  Flat  Cognitive:  Oriented  Insight:  Limited  Engagement in Group:  Limited  Engagement in Therapy:  Limited  Modes of Intervention:  Activity, Clarification, Limit-setting and Socialization  Summary of Progress/Problems:  Khaliel attended in her group session devoted on emotional regulation; presented with flat affect and delayed response. Ermon shared that alcohol contributed to his admission, after being in the hospital for one week. Chaynce was absent for majority of group meeting with physician.   Clide Dales 05/06/2011, 4:11 PM  [facility] Group Notes:  (Counselor/Nursing/MHT/Case Management/Adjunct)  05/06/2011 4:11 PM  Type of Therapy:  Group Therapy at 1:15PM  Participation Level:  None  Participation Quality:  Drowsy and Inattentive  Affect:  Depressed and Flat  Cognitive:  Oriented  Insight:  None shared  Engagement in Group:  None  Engagement in Therapy:  None  Modes of Intervention:  Limit-setting and Support  Summary of Progress/Problems:  Dyllan attended group yet dozed off during film segment thus unable to contribute to discussion.   Clide Dales 05/06/2011, 4:13 PM

## 2011-05-06 NOTE — Progress Notes (Signed)
60yo male who presents voluntarily and in no acute distress for the treatment of Depression and ETOH abuse. Appears depressed, but is pleasant and cooperative during assessment. Open and spontaneous in conversation. A/Ox4. States he has been drinking up to a 1/5 of vodka daily for the last few months. States he was employed as a [employer] but began having issues with his vision about 2 months ago and decided to remove himself from work until his vision improved. During this time his alcohol consumption has increased. Blacked out about one month ago, fell and cut his right ear (area of granulated tissue noted to upper lobe). He states he has struggled with ETOH abuse in the past (was at [facility] 7-8 years ago and was sober for 109yrs following discharge.). States his wife brought him in because he asked her to and he is seeking detox. See admission assessment for medical history. Non-smoker and denies abusing anything other than ETOH. Flatly denies SI and states he has never had any thoughts of hurting himself. Contracts for safety. Denies HI/AVH. Skin assessment and search conducted by Glennie Hawk., RN. POC and medications reviewed and understanding verbalized. Offered no questions or concerns. Escorted to room and oriented. Safety has been maintained with Q15 minute observation.

## 2011-05-06 NOTE — [facility] Suicide Risk Assessment (Signed)
Suicide Risk Assessment  Admission Assessment     Demographic factors:   See chart.  Current Mental Status:  Current Mental Status:  (denies) per nursing staff.  Patient seen and evaluated. Chart reviewed. Patient stated that his mood was "better". His affect was mood congruent, yet constricted. He denied any current thoughts of self injurious behavior, suicidal ideation or homicidal ideation. There were no auditory or visual hallucinations, paranoia, delusional thought processes, or mania noted.  Thought process was linear and goal directed.  No psychomotor agitation or retardation was noted. His speech was normal rate, tone and volume. Eye contact was good. Judgment and insight are fair.  Patient has been up and engaged on the unit.  No acute safety concerns reported from team.  Loss Factors:  Loss Factors: Loss of significant relationship; marital issues; financial issues  Historical Factors:  Historical Factors: Family history of mental illness or substance abuse; SI  Risk Reduction Factors:  Risk Reduction Factors: Sense of responsibility to family;Employed;Positive social support;Positive coping skills or problem solving skills; AA/sponsor  CLINICAL FACTORS: Alcohol Dependence & W/D; CAD s/p stent placement; HTN; HL; r/o SIMD; Transaminitis; Thrombocytopenia   COGNITIVE FEATURES THAT CONTRIBUTE TO RISK: limited insight.  SUICIDE RISK: Pt viewed as a chronic increased risk of harm to self in light of his past hx and risk factors.  No acute safety concerns on the unit.  Pt contracting for safety and in need of crisis stabilization & Tx.  PLAN OF CARE: Pt admitted for crisis stabilization, detox and treatment.  Please see orders, switched taper to Ativan in light of liver function.  Medications reviewed with pt and medication education provided.  VS q4hrs.  EKG completed this am in light of tachycardia.  Reviewed by cardiologist, no acute concerns.  No c/o CP/SOB/N/V/D/HA. Will continue q15  minute checks per unit protocol.  No clinical indication for one on one level of observation at this time.  Pt contracting for safety.  Mental health treatment, medication management and continued sobriety will mitigate against the increased risk of harm to self and/or others.  Discussed the importance of recovery with pt, as well as, tools to move forward in a healthy & safe manner.  Pt agreeable with the plan.  Discussed with the team.   Lupe Carney 05/06/2011, 11:07 AM

## 2011-05-07 MED ORDER — LORAZEPAM 1 MG PO TABS
2.0000 mg | ORAL_TABLET | Freq: Two times a day (BID) | ORAL | Status: DC
  Administered 2011-05-08 – 2011-05-11 (×7): 2 mg via ORAL
  Filled 2011-05-07: qty 2
  Filled 2011-05-07 (×2): qty 1
  Filled 2011-05-07 (×6): qty 2

## 2011-05-07 NOTE — Progress Notes (Signed)
Pt attended discharge planning group and actively participated.  Pt presents with calm mood and affect.  Pt denies having depression, anxiety and SI.  Pt states that he feels well rested today.  Pt states that he doesn't feel he needs further inpatient treatment.  Pt states that he has been through this before and knows what to do.  Pt states that he has a good support with his home AA group.  Pt will follow up with AA meetings.  SW provided pt with a brochure for local meetings and times.  Pt states he doesn't need follow up for a psychiatrist or therapist.  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  Pt states he will live with his sister or in his truck.  No further needs voiced by pt at this time. Safety planning and suicide prevention discussed.     Reyes Ivan, LCSWA 05/07/2011  9:54 AM

## 2011-05-07 NOTE — Progress Notes (Signed)
Cosigned by Carney Bern, LCSWA 3/21/20134:03 PM

## 2011-05-07 NOTE — Tx Team (Signed)
Interdisciplinary Treatment Plan Update (Adult)  Date:  05/07/2011  Time Reviewed:  9:48 AM   Progress in Treatment: Attending groups: Yes Participating in groups:  Yes Taking medication as prescribed: Yes Tolerating medication:  Yes Family/Significant othe contact made:  Counselor assessing for appropriate contact Patient understands diagnosis:  Yes Discussing patient identified problems/goals with staff:  Yes Medical problems stabilized or resolved:  Yes Denies suicidal/homicidal ideation: Yes Issues/concerns per patient self-inventory:  None identified Other: N/A  New problem(s) identified: None Identified  Reason for Continuation of Hospitalization: Medication stabilization Withdrawal symptoms  Interventions implemented related to continuation of hospitalization: mood stabilization, medication monitoring and adjustment, group therapy and psycho education, safety checks q 15 mins  Additional comments: N/A  Estimated length of stay: 3-5 days  Discharge Plan: SW will assess for appropriate referrals for pt  New goal(s): N/A  Review of initial/current patient goals per problem list:    1.  Goal(s): Address substance use  Met:  No  Target date: by discharge  As evidenced by: completing detox protocol and refer to appropriate treatment  2.  Goal (s): Reduce depressive symptoms  Met:  Yes  Target date: by discharge  As evidenced by: Reducing depression from a 10 to a 3 as reported by pt.  Pt denies having depression.  3.  Goal(s): Eliminate SI  Met:  Yes  Target date: by discharge  As evidenced by: Pt denies SI   Attendees: Patient:  Sean [name] 05/07/2011 9:50 AM   Family:     Physician:  Lupe Carney, DO 05/07/2011 9:48 AM   Nursing: Waynetta Sandy RN 05/07/2011 9:48 AM   Case Manager:  Reyes Ivan, LCSWA 05/07/2011  9:48 AM   Counselor:  Ronda Fairly, LCSWA 05/07/2011  9:48 AM   Other:  Richelle Ito, LCSW 05/07/2011 9:48 AM   Other:  Wilmon Arms, SW intern 05/07/2011 9:50 AM   Other:  Tanya Nones, SW intern 05/07/2011 9:50 AM   Other:      Scribe for Treatment Team:   Reyes Ivan 05/07/2011 9:48 AM

## 2011-05-07 NOTE — Progress Notes (Signed)
[facility] Group Notes:  (Counselor/Nursing/MHT/Case Management/Adjunct)  05/07/2011 4:03 PM  Type of Therapy:  Group Therapy at 1:15  Participation Level:  Active  Participation Quality:  Sharing  Affect:  Blunted  Cognitive:  Oriented  Insight: Very Limited  Engagement in Group: Very  Limited  Engagement in Therapy:  Unknown  Modes of Intervention:  Clarification and Support  Summary of Progress/Problems:  Patient continues to move and speak extremely slow to point that others are distracted by his delays.  Patients were asked to chose two photographs from a large sample to represent a life in balance and a light out of balance; Dameer could only relate to the balance with in a single picture. Paulanthony addressed things like color and shape, others in group were talking about emotions.   Clide Dales 05/07/2011, 4:03 PM

## 2011-05-07 NOTE — Progress Notes (Signed)
Patient unable to track well both yesterday and today; postponing psychosocial assessment in hopes that patient is clear tomorrow. Clide Dales 05/07/2011 4:01 PM

## 2011-05-07 NOTE — Progress Notes (Signed)
Patient and writer spent time discussing patient's extended ETOH use. Patient has good insight into alcoholism and states that he can't drink at all. Has three sponsors that he considers good friends who supported his admission to [facility]. Patient legally separated, has adult children. Recently fired bu hoping to get job back. States that his previous employer can be accommodating. Patient encouraged to increase fluid intake and was provided with Gatorade.

## 2011-05-07 NOTE — Progress Notes (Signed)
[facility] Group Notes:  (Counselor/Nursing/MHT/Case Management/Adjunct)  05/07/2011 3:20 PM  Type of Therapy:  Group Therapy  Participation Level:  Minimal  Participation Quality:  Attentive  Affect:  Flat  Cognitive:  Oriented  Insight:  Limited  Engagement in Group:  Limited  Engagement in Therapy:  Limited  Modes of Intervention:  Clarification, Education, Support and Exploration  Summary of Progress/Problems: In discussing life and balance, patient reported having difficulty prioritizing his responsibilities. Patient stated he feels he lacks "deadlines" in his life. Patient seemed to relate well with his peers.    Wilmon Arms 05/07/2011, 3:20 PM

## 2011-05-07 NOTE — Progress Notes (Signed)
05/07/2011         Time: 1415      Group Topic/Focus: The focus of this group is on discussing various styles of communication and communicating assertively using 'I' (feeling) statements.  Participation Level: Active  Participation Quality: Attentive  Affect: Blunted  Cognitive: Alert   Additional Comments: Patient soft-spoken, appeared to have some difficulty grasping the rules of the activity. Patient would occasionally share historical stories that vaguely matched with the discussion, but showed no personal insight.  Sean [name] 05/07/2011 3:33 PM

## 2011-05-07 NOTE — Progress Notes (Signed)
[facility] MD Progress Note  05/07/2011 1:58 PM  S/O: Patient seen and evaluated. Chart reviewed. Patient stated that his mood was "good". His affect was mood congruent, yet constricted. He denied any current thoughts of self injurious behavior, suicidal ideation or homicidal ideation. There were no auditory or visual hallucinations, paranoia, delusional thought processes, or mania noted. Thought process was linear and goal directed. No psychomotor agitation noted. His speech was decreased rate, tone and volume. Mild sedation on meds, will adjust.  Eye contact was good. Judgment and insight are fair. Patient has been up and engaged on the unit. No acute safety concerns reported from team.   Sleep:  Number of Hours: 5.5    Vital Signs:Blood pressure 127/84, pulse 102, temperature 98.2 F (36.8 C), temperature source Oral, resp. rate 16.  Lab Results: No results found for this or any previous visit (from the past 48 hour(s)).  A/P: Alcohol Dependence & W/D; CAD s/p stent placement; HTN; HL; r/o SIMD; Transaminitis; Thrombocytopenia   Treatment goals and medication management reviewed with patient.  Continue current treatment plan and medications with decrease in taper. No acute medical or psychiatric issues noted.  Pt agreeable with plan.  Discussed with team.  Lupe Carney 05/07/2011, 1:58 PM

## 2011-05-08 NOTE — Progress Notes (Signed)
Pt laying in bed with eyes closed  No distress noted  No complaints voiced  Q 15 min checks  Pt safe at present

## 2011-05-08 NOTE — Progress Notes (Signed)
Pt has a flat affect but does go to groups. Pt seems confused at times and has to be redirected. Pt rates depression and hopelessness at 4. Pt was offered support and encouragement. Pt is receptive to treatment and safety maintained on unit.

## 2011-05-08 NOTE — Progress Notes (Signed)
Patient informed Clinical research associate that he will be going to stay with sister, Foye Spurling [phone]), in [facility] upon discharge and gathering a few of belongings from home.  Sister will ask patient for discharge papers and needs information regarding necessary follow up.  Clide Dales 05/08/2011 7:41 PM

## 2011-05-08 NOTE — Progress Notes (Signed)
Cosigned by Carney Bern, LCSWA 3/22/20136:51 PM

## 2011-05-08 NOTE — Progress Notes (Signed)
Pt resting in bed, did not attend group this evening.  Pleasant but confused on approach.  No complaints voiced.  Took night time medications without incident.  Denies SI/HI/hallucinations.  Support and encouragement offered, will continue to monitor.

## 2011-05-08 NOTE — Progress Notes (Signed)
[facility] Family/Significant Other Suicide Prevention Education  Suicide Prevention Education:  Education Completed; Sister, Sean [name] at [phone] has been identified by the patient as the family member/significant other with whom the patient will be residing, and identified as the person(s) who will aid the patient in the event of a mental health crisis (suicidal ideations/suicide attempt).  With written consent from the patient, the family member/significant other has been provided the following suicide prevention education, prior to the and/or following the discharge of the patient.  The suicide prevention education provided includes the following:  Suicide risk factors  Suicide prevention and interventions  National Suicide Hotline telephone number  Encouragement to contact local behavioral health hospital  Emergency Assistance 911  Encouragement to contact area [facility]   Request made of family/significant other to:  Remove weapons (e.g., guns, rifles, knives), all items previously/currently identified as safety concern.    Remove drugs/medications (over-the-counter, prescriptions, illicit drugs), all items previously/currently identified as a safety concern.  Patient's sister, Sean [name], states there are no firearms in the home, nor alcohol and medications will be stored out of sight  The family member/significant other verbalizes understanding of the suicide prevention education information provided.  The family member/significant other agrees to remove the items of safety concern listed above.  Clide Dales 05/08/2011, 7:34 PM

## 2011-05-08 NOTE — Progress Notes (Signed)
[facility] Group Notes:  (Counselor/Nursing/MHT/Case Management/Adjunct)  05/08/2011 12:57 PM  Type of Therapy:  Group Therapy  Participation Level:  Minimal  Participation Quality:  Attentive  Affect:  Blunted  Cognitive:  Alert and Oriented  Insight:  None shared  Engagement in Group:  Limited  Modes of Intervention:  Education and Support  Summary of Progress/Problems:  Sean [name] was present for entire group, appeared attentive yet difficult to gauge.   Clide Dales 05/08/2011, 12:57 PM

## 2011-05-08 NOTE — Progress Notes (Signed)
[facility] Group Notes:  (Counselor/Nursing/MHT/Case Management/Adjunct)  05/08/2011 2:56 PM  Type of Therapy:  1:15PM Group Therapy  Participation Level:  Minimal  Participation Quality:  Attentive and Redirectable  Affect:  Flat  Cognitive:  Oriented  Insight:  Limited  Engagement in Group:  Limited  Engagement in Therapy:  Limited  Modes of Intervention:  Clarification, Education, Support and Exploration  Summary of Progress/Problems: Patient had to be redirected back on topic of discussion. Patient appeared somewhat confused when discussing radical acceptance. Patient went off the track of the discussion and began explaining that it is difficult for him to accept historical events that have happened in the past such as the [place] War. He states "I will stomp my feet and say those wars were wrong".   Wilmon Arms 05/08/2011, 2:56 PM

## 2011-05-08 NOTE — [facility] Counselor (Signed)
Adult Comprehensive Assessment  Patient ID: Sean [name], male   DOB: 1950/03/30, 61 y.o.   MRN: [id number]  Information Source:    Current Stressors:  Educational / Learning stressors: NA  Employment / Job issues: Lost job in January  Family Relationships: Strained; wife & patient are Banker / Lack of resources (include bankruptcy): No current issues Housing / Lack of housing: Unable to return home Physical health (include injuries & life threatening diseases): Eye problems-flashes, "dangly things"-saw opthamologist-may have detached retina Social relationships: Tends to isolate Substance abuse: History Bereavement / Loss: Father 2012  Living/Environment/Situation:  Living Arrangements: Spouse/significant other Living conditions (as described by patient or guardian): had been living with spouse, currentl separated-living situation unstable How long has patient lived in current situation?: 12 years. Had been "drinking buddies" with wife, feels he has blown it.  What is atmosphere in current home: Comfortable (Recently tense due to drinking.)  Family History:  Marital status: Married Number of Years Married: 42  What types of issues is patient dealing with in the relationship?: Drinking main problem, Additional relationship information: Always been in love with wife, still is.  Childhood History:  By whom was/is the patient raised?: Both parents Additional childhood history information: [place] family life as per report Description of patient's relationship with caregiver when they were a child: Good Patient's description of current relationship with people who raised him/her: Both Deceased Does patient have siblings?: Yes Number of Siblings: 5  Description of patient's current relationship with siblings: Good with most Did patient suffer any verbal/emotional/physical/sexual abuse as a child?: No Did patient suffer from severe childhood neglect?: No Has patient ever  been sexually abused/assaulted/raped as an adolescent or adult?: No Was the patient ever a victim of a crime or a disaster?: No Witnessed domestic violence?: No Has patient been effected by domestic violence as an adult?: No  Education:  Highest grade of school patient has completed: Engineer, maintenance (IT) Currently a student?: No Learning disability?: No  Employment/Work Situation:   Employment situation: Unemployed Patient's job has been impacted by current illness: Yes Describe how patient's job has been implacted: Was Naval architect, had eye problems-left to see opthamologist What is the longest time patient has a held a job?: 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 Worked with washers, dryers, dishwashers Where was the patient employed at that time?: [employer] Has patient ever been in the [employer]?: No Has patient ever served in Buyer, retail?: No  Financial Resources:   Financial resources: No income;Income from spouse (Pt also using fiancial resources from [employer]) Does patient have a representative payee or guardian?: No  Alcohol/Substance Abuse:   What has been your use of drugs/alcohol within the last 12 months?: 1/5 Vodka or 18 pk of regular beer, 24 pk if light beer, or 3-4 bottles of wine daily since job loss in January 2013 Alcohol/Substance Abuse Treatment Hx: Past Tx, Inpatient;Past detox;Attends AA/NA If yes, describe treatment: [facility] [phone]; AA sponsor; longest period of sobriety is 2 years Has alcohol/substance abuse ever caused legal problems?: No  Social Support System:   Patient's Community Support System: Production assistant, radio System: Marketing executive; AA friends; siblings Type of faith/religion: NA How does patient's faith help to cope with current illness?: NA  Leisure/Recreation:   Leisure and Hobbies: Read, watch a lot of TV  Strengths/Needs:   What things does the patient do well?: Intelligent  discourse; debate; good planner; task completion In what areas does patient struggle /  problems for patient: Alcohol and loneliness  Discharge Plan:   Does patient have access to transportation?: Yes (Probably friend or AA sponsor) Will patient be returning to same living situation after discharge?: No Plan for living situation after discharge: Patient is planning to stay with a sister before 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 Patient will since crisis stabilization, medication evaluation, group therapy, and psychoeducation in addition to case management for discharge planning.Dyane Dustman, Julious Payer. 05/08/2011

## 2011-05-08 NOTE — Progress Notes (Signed)
Pt did not attend d/c planning group on this date.  SW met with pt individually at this time.  Pt states that he feels better and states "my vitals are better".  Pt discussed his d/c plan to be to go to [facility]  Pt is still unsure where he will live upon d/c but states he will work on contacting his sisters and friends.  Pt states that one of his sisters would welcome him to stay with them but reports not knowing their phone numbers.  Pt is requesting to get the numbers from his cell phone in the locker.  No further needs voiced by pt at this time.    Sean [name], LCSWA 05/08/2011  1:25 PM

## 2011-05-09 DIAGNOSIS — R000 Tachycardia, unspecified: Secondary | ICD-10-CM

## 2011-05-09 LAB — CBC
HCT: 36.1 % — ABNORMAL LOW (ref 39.0–52.0)
Hemoglobin: 11.7 g/dL — ABNORMAL LOW (ref 13.0–17.0)
MCH: 31.7 pg (ref 26.0–34.0)
MCV: 97.8 fL (ref 78.0–100.0)
Platelets: 319 10*3/uL (ref 150–400)
RBC: 3.69 MIL/uL — ABNORMAL LOW (ref 4.22–5.81)
WBC: 4.3 10*3/uL (ref 4.0–10.5)

## 2011-05-09 LAB — HEPATIC FUNCTION PANEL
AST: 78 U/L — ABNORMAL HIGH (ref 0–37)
Albumin: 2.9 g/dL — ABNORMAL LOW (ref 3.5–5.2)
Total Bilirubin: 0.3 mg/dL (ref 0.3–1.2)
Total Protein: 6.6 g/dL (ref 6.0–8.3)

## 2011-05-09 NOTE — Progress Notes (Addendum)
[facility] MD Progress Note  05/09/2011 4:22 PM  Diagnosis:  Alcohol addiction  ADL's:  Intact  Sleep: Good  Appetite:  Good  Suicidal Ideation:  denies Homicidal Ideation:  denies AEB (as evidenced by): Pt. States he feels fine today and has had no problems.  He is sleeping well and eating well.  He has contacted his sister about staying with her when he is released and going to [facility] Mental Status Examination/Evaluation: Objective:  Appearance: Fairly Groomed  Eye Contact::  Good  Speech:  Normal Rate  Volume:  Normal  Mood:  Euthymic  Affect:  Congruent  Thought Process:  Linear  Orientation:  Full  Thought Content:  WDL  Suicidal Thoughts:  No  Homicidal Thoughts:  No  Memory:  Immediate;   Good  Judgement:  Fair  Insight:  Fair  Psychomotor Activity:  Normal  Concentration:  Fair  Recall:  Fair  Akathisia:  No  Handed:    AIMS (if indicated):     Assets:  Desire for Improvement  Sleep:  Number of Hours: 5.5    Vital Signs:Blood pressure 107/74, pulse 134, temperature 98.6 F (37 C), temperature source Oral, resp. rate 16. Current Medications: Current Facility-Administered Medications  Medication Dose Route Frequency Provider Last Rate Last Dose  . acetaminophen (TYLENOL) tablet 650 mg  650 mg Oral Q6H PRN Curlene Labrum Readling, MD      . alum & mag hydroxide-simeth (MAALOX/MYLANTA) 200-200-20 MG/5ML suspension 30 mL  30 mL Oral Q4H PRN Curlene Labrum Readling, MD      . aspirin EC tablet 81 mg  81 mg Oral Daily Curlene Labrum Readling, MD   81 mg at 05/09/11 0840  . atorvastatin (LIPITOR) tablet 40 mg  40 mg Oral QHS Curlene Labrum Readling, MD   40 mg at 05/08/11 2159  . clopidogrel (PLAVIX) tablet 75 mg  75 mg Oral Q breakfast Curlene Labrum Readling, MD   75 mg at 05/09/11 0840  . folic acid (FOLVITE) tablet 1 mg  1 mg Oral Daily Curlene Labrum Readling, MD   1 mg at 05/09/11 0840  . loperamide (IMODIUM) capsule 2-4 mg  2-4 mg Oral PRN Curlene Labrum Readling, MD      . LORazepam (ATIVAN) tablet 2 mg   2 mg Oral Q12H Alyson Kuroski-Mazzei, DO   2 mg at 05/09/11 1311  . magnesium hydroxide (MILK OF MAGNESIA) suspension 30 mL  30 mL Oral Daily PRN Ronny Bacon, MD      . mulitivitamin with minerals tablet 1 tablet  1 tablet Oral Daily Ronny Bacon, MD   1 tablet at 05/09/11 0840  . ondansetron (ZOFRAN-ODT) disintegrating tablet 4 mg  4 mg Oral Q6H PRN Curlene Labrum Readling, MD      . pantoprazole (PROTONIX) EC tablet 40 mg  40 mg Oral QHS Curlene Labrum Readling, MD   40 mg at 05/08/11 2158  . thiamine (B-1) injection 100 mg  100 mg Intramuscular Once Curlene Labrum Readling, MD      . thiamine (VITAMIN B-1) tablet 100 mg  100 mg Oral Daily Curlene Labrum Readling, MD   100 mg at 05/09/11 0840    Lab Results: No results found for this or any previous visit (from the past 48 hour(s)).   CIWA:  CIWA-Ar Total: 0  COWS:     Treatment Plan Summary: Tachycardia will need to be assessed. Vitals to be retaken. Daily contact with patient to assess and evaluate symptoms and progress in treatment  Medication management. Retake of pulse 124. Chart is reviewed and the patient continues to have early morning elevations in his pulse rate, but he also has them at other times as well.  Will continue to watch.  Plan: Continue with current plan of care no changes at this time.  Rona Ravens. Baer Hinton PAC 05/09/2011, 4:22 PM

## 2011-05-09 NOTE — Progress Notes (Signed)
Patient ID: LANGLEY INGALLS, male   DOB: 12-18-1950, 61 y.o.   MRN: [id number]  [facility] Group Notes:  (Counselor/Nursing/MHT/Case Management/Adjunct)  05/09/2011 1:15 PM  Type of Therapy:  Group Therapy, Dance/Movement Therapy   Participation Level:  Minimal  Participation Quality:  Appropriate  Affect:  Appropriate  Cognitive:  Appropriate  Insight:  Limited  Engagement in Group:  Limited  Engagement in Therapy:  Limited  Modes of Intervention:  Clarification, Problem-solving, Role-play, Socialization and Support  Summary of Progress/Problems:  Therapist discussed with group the term self-sabotage and enabling. Group identified reasons for turning to self sabotaging behaviors. Therapist processed with group ways to develop positive and healthy alternatives.  Therapist asked group to reflect on one positive characteristic about themselves in order to take the place of negative thoughts which can lead to self-sabotaging behaviors.     Rhunette Croft

## 2011-05-09 NOTE — Progress Notes (Signed)
[facility] MD Progress Note  05/09/2011 6:50 AM  S/O: Patient seen and evaluated. Chart reviewed. Patient stated that his mood was "better". His affect was mood congruent.  He denied any current thoughts of self injurious behavior, suicidal ideation or homicidal ideation. There were no auditory or visual hallucinations, paranoia, delusional thought processes, or mania noted. Thought process was linear and goal directed. No psychomotor agitation noted. His speech was decreased rate, tone and volume. Mild sedation on meds improved with decreased taper. Eye contact was good. Judgment and insight are fair. Patient has been up and engaged on the unit. No acute safety concerns reported from team.   Sleep:  Number of Hours: 5.5    Vital Signs:Blood pressure 139/95, pulse 117, temperature 98.4 F (36.9 C), temperature source Oral, resp. rate 16.  Lab Results: No results found for this or any previous visit (from the past 48 hour(s)).  A/P: Alcohol Dependence & W/D; CAD s/p stent placement; HTN; HL; r/o SIMD; Transaminitis; Thrombocytopenia   Treatment goals and medication management reviewed with patient.  Continue current treatment plan and medications with decrease in taper. No acute medical or psychiatric issues noted.  Pt agreeable with plan.  Discussed with team.  Repeat CBC and hep panel pending.  Lupe Carney 05/09/2011, 6:50 AM

## 2011-05-09 NOTE — Progress Notes (Signed)
Pt is up and out in milieu interacting with peers and attending groups with minimal participation.  Rates depression at 2 and hopelessness at 1 although affect is flat. Reports poor sleep but good appetite. Denies SI.  No physical complaints and no prn medications requested.  Support offered and 15' checks cont for safety.

## 2011-05-10 DIAGNOSIS — F39 Unspecified mood [affective] disorder: Secondary | ICD-10-CM

## 2011-05-10 NOTE — Progress Notes (Signed)
Patient ID: Sean [name], male   DOB: 09-29-1950, 61 y.o.   MRN: [id number]  [facility] Group Notes:  (Counselor/Nursing/MHT/Case Management/Adjunct)  05/10/2011 1:15 PM  Type of Therapy:  Group Therapy, Dance/Movement Therapy   Participation Level:  Active  Participation Quality:  Appropriate  Affect:  Appropriate  Cognitive:  Appropriate  Insight:  Limited  Engagement in Group:  Good  Engagement in Therapy:  Good  Modes of Intervention:  Clarification, Problem-solving, Role-play, Socialization and Support  Summary of Progress/Problems:  Therapist discussed the role of healthy supports.  Therapist asked group to define what a good support system looks like to them.  Pt. stated that, " my sponsors are my supports".    Rhunette Croft

## 2011-05-10 NOTE — Progress Notes (Signed)
Patient ID: Sean [name], male   DOB: 12-Mar-1950, 61 y.o.   MRN: [id number] Has been rather quiet this evening, but attended group.  Doesn't initiate conversation, affect is flat, face doesn't register much in the way of interaction, slow to respond, brief, seems preoccupied.  Was compliant with meds, had snack and did come to med window.  Will continue to monitor.

## 2011-05-10 NOTE — Progress Notes (Signed)
Pt is out on unit with minimal interaction. Sean [name] presents with intermittent times of confusion ie: losing his way back to unit from cafeteria. Attended group with good participation.  Denies SI,depression, or hopelessness.  Had a conversation with sponsor and forwarded to CM.  No physical complaints and no prn medications requested. Ongoing support offered and 15' checks cont for safety.

## 2011-05-10 NOTE — Progress Notes (Signed)
Patient ID: Sean [name], male   DOB: March 08, 1950, 61 y.o.   MRN: [id number]  [facility] MD Progress Note  05/10/2011 9:13 PM  Diagnosis:  Axis I: Alcohol withdrawal  ADL's:  intact  Sleep:  fair  Suicidal Ideation: Denies  Homicidal ideation: Denies Subjective:Sai is concerned and anxious today.  He tells me his plans have changed and he would like to leave ASAP.  He states that his family is not ready for him to be discharged and want him to enter into a facility like [facility].  He states his sponsor wants him to avoid heading straight to the vodka.  BP 139/84  Pulse 116  Temp(Src) 97.4 F (36.3 C) (Oral)  Resp 16 Objective: Avelardo's speech is clear and goal directed.  He is still not tracking well mentally.  He seems saddened by the family's request for more supported therapy.  He is clearly wrestling with wants vs. Needs demons.  He does see that time taken to consider his options is not wasted time.  Sleep:  Number of Hours: 4.75   Vital Signs:Blood pressure 139/84, pulse 116, temperature 97.4 F (36.3 C), temperature source Oral, resp. rate 16.  Lab Results:  Results for orders placed during the hospital encounter of 05/05/11 (from the past 48 hour(s))  HEPATIC FUNCTION PANEL     Status: Abnormal   Collection Time   05/09/11  7:32 PM      Component Value Range Comment   Total Protein 6.6  6.0 - 8.3 (g/dL)    Albumin 2.9 (*) 3.5 - 5.2 (g/dL)    AST 78 (*) 0 - 37 (U/L)    ALT 110 (*) 0 - 53 (U/L)    Alkaline Phosphatase 106  39 - 117 (U/L)    Total Bilirubin 0.3  0.3 - 1.2 (mg/dL)    Bilirubin, Direct 0.1  0.0 - 0.3 (mg/dL)    Indirect Bilirubin 0.2 (*) 0.3 - 0.9 (mg/dL)   CBC     Status: Abnormal   Collection Time   05/09/11  7:32 PM      Component Value Range Comment   WBC 4.3  4.0 - 10.5 (K/uL)    RBC 3.69 (*) 4.22 - 5.81 (MIL/uL)    Hemoglobin 11.7 (*) 13.0 - 17.0 (g/dL)    HCT 40.9 (*) 81.1 - 52.0 (%)    MCV 97.8  78.0 - 100.0 (fL)    MCH 31.7  26.0 - 34.0 (pg)     MCHC 32.4  30.0 - 36.0 (g/dL)    RDW 91.4  78.2 - 95.6 (%)    Platelets 319  150 - 400 (K/uL)     Physical Findings: AIMS:  , ,  ,  ,    CIWA:  CIWA-Ar Total: 0  COWS:     Treatment Plan Summary: As the plan for discharged has changed and Sayeed is still tachycardic, as well as showing some continued difficulty tracking, Tarin is asked to wait until tomorrow and consider his options dispassionately.   He agreed to do so.  Plan:  Joakim Huesman 05/10/2011, 9:13 PM

## 2011-05-11 MED ORDER — CLOPIDOGREL BISULFATE 75 MG PO TABS: 75.0000 mg | ORAL_TABLET | Freq: Every day | ORAL | Status: DC

## 2011-05-11 MED ORDER — SIMVASTATIN 40 MG PO TABS
80.0000 mg | ORAL_TABLET | Freq: Every day | ORAL | Status: DC
  Filled 2011-05-11: qty 28

## 2011-05-11 MED ORDER — VITAMIN C 500 MG PO TABS
500.0000 mg | ORAL_TABLET | Freq: Every day | ORAL | Status: DC
  Filled 2011-05-11 (×2): qty 14

## 2011-05-11 MED ORDER — PANTOPRAZOLE SODIUM 40 MG PO TBEC: 40.0000 mg | DELAYED_RELEASE_TABLET | Freq: Every day | ORAL | Status: DC

## 2011-05-11 NOTE — Progress Notes (Signed)
[facility] Group Notes:  (Counselor/Nursing/MHT/Case Management/Adjunct)  05/11/2011 1:07 PM  Type of Therapy:  Group Therapy at 11:00  Participation Level:  Minimal  Participation Quality:  Redirectable  Affect:  Flat  Cognitive:  Alert  Insight:  Limited  Engagement in Group:  Limited  Engagement in Therapy:  Limited  Modes of Intervention:  Clarification, Education, Socialization and Support  Summary of Progress/Problems: During discussion on patients' biggest obstacles Antrell chose to talk about the effects of alcohol on the stomach. Pt was open to redirection, affect was flat.   Clide Dales 05/11/2011, 1:07 PM

## 2011-05-11 NOTE — Progress Notes (Signed)
Patient ID: Sean [name], male   DOB: [birth year], 61 y.o.   MRN: [id number] Was out on hall and dayroom this evening, didn't want to go to group, just wanted to go to his room and go to bed early tonight. Agreed to go to group and come to get his hs meds afterward but seemed tired and irritable, quiet.  Voiced no specific c/o's, little interaction.  Will continue to monitor.

## 2011-05-11 NOTE — Progress Notes (Signed)
Pt was discharged to [facility] today.  He denied any S/I H/I or A/V hallucinations.    He was given f/u appointment, rx, sample medications, hotline info booklet, and AA meetings provided by the case manager.  He voiced understanding to all instructions provided.  He declined the need for smoking cessation materials.  He removed his nicotine patch before he left.

## 2011-05-11 NOTE — Discharge Summary (Signed)
Physician Discharge Summary Note  Patient:  Sean [name] is an 61 y.o., male MRN:  [id number] DOB:  Nov 12, 1950 Patient phone:  [phone] (home)  Patient address:   [address] [place] [address],   Date of Admission:  05/05/2011 Date of Discharge: 05/11/2011 Reason for Admission: Alcohol detox   Discharge Diagnoses: Principal Problem:  *Tachycardia Active Problems:  Abnormal liver enzymes   Axis Diagnosis:  Discharge Diagnoses:  AXIS I: Alcohol Dependence  AXIS II: Deferred  AXIS III: Transaminitis, improving; thrombocytopenia, resolved  Past Medical History   Diagnosis  Date   .  Coronary artery disease      95% mid LAD s/p BMS of LAD, other non critical obstruction, put on plavix and aspirin 325   .  HTN (hypertension)    .  HLD (hyperlipidemia)    .  Alcohol addiction      2010- admitted for withdrawal , detox admission after that   .  Mental disorder    .  Depression    AXIS IV: Moderate  AXIS V: 50  Level of Care:  Long-term IP psych.  Hospital Course:  Sean [name] was transferred to [facility] after in patient treatment and detox due to shortness of breath, tachycardia, and unstable hypertension. When he was stable for discharge out of Medicine unit, there were concerns raised by close friends of his, and his AA sponsor that Sean [name] is unable to return home.  His wife has filed for legal separation and he cannot return home.  He was unstable for driving and his friends expressed concerns for his safety as he "is reckless when he is down and out." It was felt that the patient would benefit from further stabilization in the [facility] unit as well and he was transferred to [facility].   Due to his elevated liver enzymes he was switched over to an ativan protocol to help continue his withdrawal.  He was unsteady on his feet for the first 24 hours, and his cognition was delayed.  His Ativan taper was decreased by half and he began to improve.  Sean [name] attended groups and unit programming.  He  was also involved in AA and NA meetings on the unit.  He continued to have bouts of tachycardia but reported no symptoms of chest pain, shortness of breath or excessive fatigue.   Sean [name] was evaluated by the treatment team and originally planned to be discharged to go to his sister's home and recover there.  All was set for that plan however, the family and AA sponsor had misgivings about Sean [name]'s stability and readiness for discharge.  Over the weekend Sean [name]'s plans crumbled due to family concerns and he requested a residential treatment placement.  He was able to be discharged to [facility] for further rehabilitation and stabilization. Consults:  None  Significant Diagnostic Studies:  None  Discharge Vitals:   Blood pressure 146/82, pulse 123, temperature 96.5 F (35.8 C), temperature source Oral, resp. rate 26.  Mental Status Exam: See Mental Status Examination and Suicide Risk Assessment completed by Attending Physician prior to discharge.  Discharge destination:  [facility]  Is patient on multiple antipsychotic therapies at discharge:  No   Has Patient had three or more failed trials of antipsychotic monotherapy by history:  No  Recommended Plan for Multiple Antipsychotic Therapies: NO  Discharge Orders    Future Appointments: Provider: Department: Dept Phone: Center:   08/07/2011 7:30 AM Sherrie George, MD [facility] [phone] None     Future  Orders Please Complete By Expires   Diet - low sodium heart healthy      Increase activity slowly      Discharge instructions      Comments:   Take all medication as prescribed.     Medication List  As of 05/11/2011 11:55 AM   STOP taking these medications         thiamine 100 MG tablet         TAKE these medications      Indication    aspirin 81 MG EC tablet   Take 1 tablet (81 mg total) by mouth daily.       clopidogrel 75 MG tablet   Commonly known as: PLAVIX   Take 1 tablet (75 mg total) by mouth daily.    Indication:  Treatment to Prevent a Blood Clot in a Vascular Stent      folic acid 1 MG tablet   Commonly known as: FOLVITE   Take 1 tablet (1 mg total) by mouth daily.       mulitivitamin with minerals Tabs   Take 1 tablet by mouth daily.       pantoprazole 40 MG tablet   Commonly known as: PROTONIX   Take 1 tablet (40 mg total) by mouth at bedtime.    Indication: Gastroesophageal Reflux Disease      simvastatin 80 MG tablet   Commonly known as: ZOCOR   Take 80 mg by mouth every morning.       vitamin C 500 MG tablet   Commonly known as: ASCORBIC ACID   Take 500 mg by mouth daily.            Follow-up Information    Follow up with [facility] on 05/11/2011. (Will be picked up by [facility] at 1:00 pm)    Contact information:   [address] [place], [place] [address] [phone]         Follow-up recommendations:  Activity:  as tolerated Diet:  heart healthy.  Comments:  It is our hope that Sean [name] will continue to have the support of his family and his AA sponsor who truly seem invested in his wellbeing.  Signed:  Rona Ravens. Sean [name] PAC For Dr. Lupe [name] 05/11/2011, 11:55 AM

## 2011-05-11 NOTE — [facility] Suicide Risk Assessment (Signed)
Suicide Risk Assessment  Discharge Assessment      Demographic factors: Male;Divorced or widowed;Caucasian  Current Mental Status:  Current Mental Status:  (denies) per nursing staff.  Patient seen and evaluated. Chart reviewed. Patient stated that his mood was "better". His affect was mood congruent and euthymic. He denied any current thoughts of self injurious behavior, suicidal ideation or homicidal ideation. There were no auditory or visual hallucinations, paranoia, delusional thought processes, or mania noted.  Thought process was linear and goal directed.  No psychomotor agitation or retardation was noted. His speech was normal rate, tone and volume. Eye contact was good. Judgment and insight are improved.  Patient has been up and engaged on the unit.  No acute safety concerns reported from team.  Stable for transition to [place].  Loss Factors:  Loss Factors: Loss of significant relationship; marital issues; financial issues  Historical Factors:  Historical Factors: Family history of mental illness or substance abuse; SI  Risk Reduction Factors:  Risk Reduction Factors: Sense of responsibility to family;Employed;Positive social support;Positive coping skills or problem solving skills; AA/sponsor  Clinical Issues During Admission: Alcohol Dependence & W/D; CAD s/p stent placement; HTN; HL; r/o SIMD; Transaminitis; Thrombocytopenia   Discharge Diagnoses:   AXIS I: Alcohol Dependence AXIS II:  Deferred AXIS III:  Transaminitis, improving; thrombocytopenia, resolved Past Medical History  Diagnosis Date  . Coronary artery disease     95% mid LAD s/p BMS of LAD, other non critical obstruction, put on plavix and aspirin 325  . HTN (hypertension)   . HLD (hyperlipidemia)   . Alcohol addiction     2010- admitted for withdrawal , detox admission after that  . Mental disorder   . Depression   AXIS IV:  Moderate AXIS V: 50  Cognitive Features That Contribute To Risk: limited insight;  cognitive slowing.  Suicide Risk: Pt viewed as a chronic increased risk of harm to self in light of his past hx and risk factors.  No acute safety concerns since on the unit.  Pt contracting for safety and is stable for transition to [place].  Plan Of Care/Follow-up recommendations: Pt seen and evaluated. Chart reviewed.  Pt stable for and requesting discharge directly to [facility]. Pt contracting for safety and does not currently meet [facility] involuntary commitment criteria for continued hospitalization against their will.  Mental health treatment, medication management and continued sobriety will mitigate against the potential increased risk of harm to self and/or others.  Discussed the importance of recovery further with pt, as well as, tools to move forward in a healthy & safe manner.  Pt agreeable with the plan.  Discussed with the team.  Please see orders, follow up appointments per AVS and full discharge summary to be completed by physician extender.  Recommend follow up with AA/NA and MH s/p discharge from [place].  Diet: Heart Healthy.  Activity: As tolerated.     Lupe Carney 05/11/2011, 1:48 PM

## 2011-05-11 NOTE — Progress Notes (Signed)
[facility] Case Management Discharge Plan:  Will you be returning to the same living situation after discharge: No. At discharge, do you have transportation home?:Yes,  [facility] will provide transportation Do you have the ability to pay for your medications:Yes,  provided 14 day supply of meds   Release of information consent forms completed and in the chart;  Patient's signature needed at discharge.  Patient to Follow up at:  Follow-up Information    Follow up with [facility] on 05/11/2011. (Will be picked up by [facility] at 1:00 pm)    Contact information:   [address] [place], [place] [address] [phone]         Patient denies SI/HI:   Yes,  denies SI/HI    Safety Planning and Suicide Prevention discussed:  Yes,  discussed with pt  Barrier to discharge identified:No.  Summary and Recommendations: Pt attended discharge planning group and actively participated.  Pt presents with calm mood and affect.  Pt ranks depression at a 2 and anxiety at a 4 today.  Pt denies SI.  Pt reports talking with family and his AA support group and has decided he does need further treatment from here.  Pt has agreed to go to [facility], as he can not financially afford Risk manager at this time.  Pt reports feeling stable to d/c.  No recommendations from SW.  No further needs voiced by pt.  Pt stable to discharge.     Carmina Miller 05/11/2011, 11:52 AM

## 2011-05-11 NOTE — Tx Team (Signed)
Interdisciplinary Treatment Plan Update (Adult)  Date:  05/11/2011  Time Reviewed:  12:00 PM   Progress in Treatment: Attending groups: Yes Participating in groups:  Yes Taking medication as prescribed: Yes Tolerating medication:  Yes Family/Significant othe contact made:  Yes Patient understands diagnosis:  Yes Discussing patient identified problems/goals with staff:  Yes Medical problems stabilized or resolved:  Yes Denies suicidal/homicidal ideation: Yes Issues/concerns per patient self-inventory:  None identified Other: N/A  New problem(s) identified: None Identified  Reason for Continuation of Hospitalization: Stable to d/c  Interventions implemented related to continuation of hospitalization: Stable to d/c  Additional comments: N/A  Estimated length of stay: D/C today  Discharge Plan: Pt is going to [facility] today for further treatment   New goal(s): N/A  Review of initial/current patient goals per problem list:    1.  Goal(s): Address substance use  Met:  Yes  Target date: by discharge  As evidenced by: completed detox protocol and referred to appropriate treatment  2.  Goal (s): Reduce depressive and anxiety symptoms  Met:  Yes  Target date: by discharge  As evidenced by: Reducing depression from a 10 to a 3 as reported by pt.  Pt ranks depression at a 2 and anxiety at a 3-4 today and reports feeling stable to d/c today.   3.  Goal(s): Eliminate SI  Met:  Yes  Target date: by discharge  As evidenced by: Pt denies SI   Attendees: Patient:     Family:     Physician:  Lupe Carney, DO 05/11/2011 12:00 PM   Nursing: Omelia Blackwater, RN 05/11/2011 12:00 PM   Case Manager:  Reyes Ivan, LCSWA 05/11/2011  12:00 PM   Counselor:  Ronda Fairly, LCSWA 05/11/2011  12:00 PM   Other:  Richelle Ito, LCSW 05/11/2011 12:00 PM   Other:  Verne Spurr, PA 05/11/2011 12:02 PM   Other:  Chinita Greenland, RN 05/11/2011 12:02 PM   Other:      Scribe for Treatment  Team:   Reyes Ivan 05/11/2011 12:00 PM

## 2011-05-11 NOTE — Progress Notes (Signed)
Clinical Social Work:  This patient has been referred and has authorization to [facility] if patient still wants to go.  [facility] has been calling and multiple messages have been left for counselors at [facility] regarding patient other DC plan.  If patient still wants to go to [facility] please call and follow up.  [phone] and ask for Sarah.    Ashley Jacobs, MSW LCSW [phone]

## 2011-05-12 NOTE — Progress Notes (Signed)
Patient ID: Sean [name], male   DOB: [birth year], 61 y.o.   MRN: [id number] Pt. Discharged home. Was going to ride the bus home. He voiced understanding of discharge instructions and of follow up plan. He  Denies SI thoughts. All belongings taken home with him.

## 2011-05-14 NOTE — Progress Notes (Signed)
Patient Discharge Instructions:  Psychiatric Admission Assessment Note Provided,  05/13/2011 After Visit Summary (AVS) Provided,  05/13/2011 Face Sheet Provided, 05/13/2011 Faxed/Sent to the Next Level Care provider:  05/13/2011 Sent Suicide Risk Assessment - Discharge Assessment 05/13/2011  Faxed to [facility] @ [phone]  Wandra Scot, 05/14/2011, 5:45 PM

## 2011-08-07 ENCOUNTER — Ambulatory Visit (INDEPENDENT_AMBULATORY_CARE_PROVIDER_SITE_OTHER): Payer: 59 | Admitting: Ophthalmology

## 2011-08-17 ENCOUNTER — Ambulatory Visit (INDEPENDENT_AMBULATORY_CARE_PROVIDER_SITE_OTHER): Payer: 59 | Admitting: Ophthalmology

## 2011-08-17 DIAGNOSIS — H2510 Age-related nuclear cataract, unspecified eye: Secondary | ICD-10-CM

## 2011-08-17 DIAGNOSIS — H43819 Vitreous degeneration, unspecified eye: Secondary | ICD-10-CM

## 2011-08-17 DIAGNOSIS — H4310 Vitreous hemorrhage, unspecified eye: Secondary | ICD-10-CM

## 2011-08-17 DIAGNOSIS — H35439 Paving stone degeneration of retina, unspecified eye: Secondary | ICD-10-CM

## 2012-11-12 ENCOUNTER — Other Ambulatory Visit: Payer: Self-pay | Admitting: Cardiology

## 2012-11-12 DIAGNOSIS — Z79899 Other long term (current) drug therapy: Secondary | ICD-10-CM

## 2012-11-12 DIAGNOSIS — E7800 Pure hypercholesterolemia, unspecified: Secondary | ICD-10-CM

## 2012-11-28 ENCOUNTER — Other Ambulatory Visit (INDEPENDENT_AMBULATORY_CARE_PROVIDER_SITE_OTHER): Payer: BC Managed Care – PPO

## 2012-11-28 DIAGNOSIS — Z79899 Other long term (current) drug therapy: Secondary | ICD-10-CM

## 2012-11-28 DIAGNOSIS — E7800 Pure hypercholesterolemia, unspecified: Secondary | ICD-10-CM

## 2012-11-28 LAB — LIPID PANEL
Cholesterol: 134 mg/dL (ref 0–200)
VLDL: 24.2 mg/dL (ref 0.0–40.0)

## 2012-11-28 LAB — ALT: ALT: 30 U/L (ref 0–53)

## 2012-11-30 ENCOUNTER — Telehealth: Payer: Self-pay | Admitting: General Surgery

## 2012-11-30 DIAGNOSIS — E7800 Pure hypercholesterolemia, unspecified: Secondary | ICD-10-CM

## 2012-11-30 DIAGNOSIS — Z79899 Other long term (current) drug therapy: Secondary | ICD-10-CM

## 2012-11-30 NOTE — Progress Notes (Signed)
Letter sent to pt to make aware. Next fasting panel on 05/31/13.

## 2012-11-30 NOTE — Telephone Encounter (Signed)
Message copied by Nita Sells on Wed Nov 30, 2012  1:22 PM ------      Message from: SMART, Gaspar Skeeters      Created: Tue Nov 29, 2012  5:33 PM       RF: CAD, HTN, age - LDL goal < 70, non-HDL < 100.       Meds:  Lipitor 40 mg qd.       Good panel with LDL ~ 70, and non-HDL < 100.  Will continue to watch.  Won't titrate lipitor further given h/o elevated LFTs, however if LDL continues to trend up may titrate lipitor at that time.      History of elevated LFTs.       ALT is  WNL at this time.      Plan:       1. No change in meds at this time.      2. Recheck lipid panel and hepatic panel in 6 months.       Please notify patient, and set up lab. Thanks.      Please notify patient, update meds, and set up labs. thanks ------

## 2012-12-22 ENCOUNTER — Encounter: Payer: Self-pay | Admitting: Cardiology

## 2013-02-03 ENCOUNTER — Ambulatory Visit: Payer: Self-pay | Admitting: Cardiology

## 2013-02-27 ENCOUNTER — Ambulatory Visit: Payer: Self-pay | Admitting: Cardiology

## 2013-03-13 ENCOUNTER — Ambulatory Visit: Payer: Self-pay | Admitting: Cardiology

## 2013-04-10 ENCOUNTER — Ambulatory Visit: Payer: Self-pay | Admitting: Cardiology

## 2013-05-05 ENCOUNTER — Ambulatory Visit: Payer: Self-pay | Admitting: Cardiology

## 2013-05-22 ENCOUNTER — Ambulatory Visit (INDEPENDENT_AMBULATORY_CARE_PROVIDER_SITE_OTHER): Payer: BC Managed Care – PPO | Admitting: Cardiology

## 2013-05-22 ENCOUNTER — Encounter: Payer: Self-pay | Admitting: Cardiology

## 2013-05-22 VITALS — BP 144/85 | HR 70 | Ht 71.0 in | Wt 204.8 lb

## 2013-05-22 DIAGNOSIS — I2510 Atherosclerotic heart disease of native coronary artery without angina pectoris: Secondary | ICD-10-CM

## 2013-05-22 DIAGNOSIS — I10 Essential (primary) hypertension: Secondary | ICD-10-CM

## 2013-05-22 DIAGNOSIS — E785 Hyperlipidemia, unspecified: Secondary | ICD-10-CM

## 2013-05-22 MED ORDER — ASPIRIN 81 MG PO TBEC: 81.0000 mg | DELAYED_RELEASE_TABLET | Freq: Every day | ORAL | Status: DC

## 2013-05-22 NOTE — Patient Instructions (Signed)
Your physician has recommended you make the following change in your medication:  1) DECREASE Asprin to 81mg  daily  Your physician recommends that you return for a FASTING lipid profile and alt on 05/23/13 between 7:30am-5:15pm  Your physician wants you to follow-up in: 1 year You will receive a reminder letter in the mail two months in advance. If you don't receive a letter, please call our office to schedule the follow-up appointment.

## 2013-05-22 NOTE — Progress Notes (Signed)
  [address] [place], [place]  [address] Phone: [phone] Fax:  [phone]  Date:  05/22/2013   ID:  Sean BeamsDavid B [name], DOB Aug 15, 1950, MRN [id number]  PCP:  Farris HasMORROW, AARON, MD  Cardiologist:  Armanda Magicraci Turner, MD     History of Present Illness: Sean [name] is a 63 y.o. male with a history of ASCAD s/p BMS to LAD, dyslipidemia and HTN who presents today for followup.  He is doing well.  He denies any chest pain, SOB, DOE, LE edema, dizziness, palpitations or syncope.  He walks for exercise without any problems.     Wt Readings from Last 3 Encounters:  05/22/13 204 lb 12.8 oz (92.897 kg)  05/05/11 177 lb 9.6 oz (80.559 kg)     Past Medical History  Diagnosis Date  . Alcohol addiction     2010- admitted for withdrawal , detox admission after that  . Mental disorder   . Depression   . Peptic ulcer   . ED (erectile dysfunction)   . HTN (hypertension)   . HLD (hyperlipidemia)   . Coronary artery disease     95% mid LAD s/p BMS of LAD with aneurysmal segment, other non critical obstruction, put on plavix and aspirin 325    Current Outpatient Prescriptions  Medication Sig Dispense Refill  . aspirin 325 MG EC tablet Take 325 mg by mouth daily.      Marland Kitchen. atorvastatin (LIPITOR) 40 MG tablet Take 40 mg by mouth daily.      . clopidogrel (PLAVIX) 75 MG tablet Take 1 tablet (75 mg total) by mouth daily.  30 tablet  0  . Multiple Vitamin (MULITIVITAMIN WITH MINERALS) TABS Take 1 tablet by mouth daily.      . vitamin C (ASCORBIC ACID) 500 MG tablet Take 500 mg by mouth daily.       No current facility-administered medications for this visit.    Allergies:   No Known Allergies  Social History:  The patient  reports that he has quit smoking. He quit smokeless tobacco use about 25 years ago. He reports that he does not drink alcohol or use illicit drugs.   Family History:  The patient's family history includes Prostate cancer in his father. There is no history of Anesthesia  problems, Hypotension, Malignant hyperthermia, or Pseudochol deficiency.   ROS:  Please see the history of present illness.      All other systems reviewed and negative.   PHYSICAL EXAM: VS:  BP 144/85  Pulse 70  Ht 5\' 11"  (1.803 m)  Wt 204 lb 12.8 oz (92.897 kg)  BMI 28.58 kg/m2 Well nourished, well developed, in no acute distress HEENT: normal Neck: no JVD Cardiac:  normal S1, S2; RRR; no murmur Lungs:  clear to auscultation bilaterally, no wheezing, rhonchi or rales Abd: soft, nontender, no hepatomegaly Ext: no edema Skin: warm and dry Neuro:  CNs 2-12 intact, no focal abnormalities noted  EKG:   NSR with LAE, old septal infarct    ASSESSMENT AND PLAN:  1. ASCAD with no angina - continue ASA/Plavix and decrease ASA to 81mg  daily 2. HTN well controlled 3. Dyslipidemia  - at goal 10/14 - recheck statin panel and ALT - continue Atorvastatin  Followup with me in 1 year  Signed, Armanda Magicraci Turner, MD 05/22/2013 10:36 AM

## 2013-05-23 ENCOUNTER — Other Ambulatory Visit: Payer: Self-pay | Admitting: General Surgery

## 2013-05-23 ENCOUNTER — Encounter: Payer: Self-pay | Admitting: General Surgery

## 2013-05-23 ENCOUNTER — Other Ambulatory Visit (INDEPENDENT_AMBULATORY_CARE_PROVIDER_SITE_OTHER): Payer: BC Managed Care – PPO

## 2013-05-23 DIAGNOSIS — E785 Hyperlipidemia, unspecified: Secondary | ICD-10-CM

## 2013-05-23 DIAGNOSIS — E7800 Pure hypercholesterolemia, unspecified: Secondary | ICD-10-CM

## 2013-05-23 DIAGNOSIS — Z79899 Other long term (current) drug therapy: Secondary | ICD-10-CM

## 2013-05-23 LAB — HEPATIC FUNCTION PANEL
ALK PHOS: 64 U/L (ref 39–117)
ALT: 24 U/L (ref 0–53)
AST: 21 U/L (ref 0–37)
Albumin: 3.9 g/dL (ref 3.5–5.2)
BILIRUBIN TOTAL: 0.8 mg/dL (ref 0.3–1.2)
Bilirubin, Direct: 0 mg/dL (ref 0.0–0.3)
Total Protein: 6.7 g/dL (ref 6.0–8.3)

## 2013-05-23 LAB — LIPID PANEL
CHOL/HDL RATIO: 3
Cholesterol: 103 mg/dL (ref 0–200)
HDL: 39.4 mg/dL (ref >39.00)
LDL Cholesterol: 51 mg/dL (ref 0–99)
TRIGLYCERIDES: 61 mg/dL (ref 0.0–149.0)
VLDL: 12.2 mg/dL (ref 0.0–40.0)

## 2013-05-23 LAB — ALT: ALT: 24 U/L (ref 0–53)

## 2013-07-20 IMAGING — CT CT HEAD W/O CM
1 series · 16 of 30 positions shown, 20 images · non-contrast
Comparison: 06/03/2008

CLINICAL DATA: Fall, nosebleed, alcohol withdrawal

CT HEAD WITHOUT CONTRAST
TECHNIQUE: Contiguous axial images were obtained from the base of
the skull through the vertex without contrast.

[Series 2: head routine 4.8 h37s · axial · 0.50mm/px · z∈[-150,+5]mm · 16 of 36 slices shown, 20 images]
[im 2/36  brain]
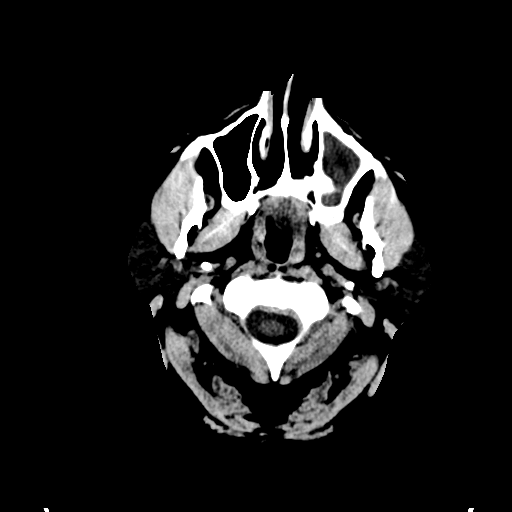
[im 2/36  bone]
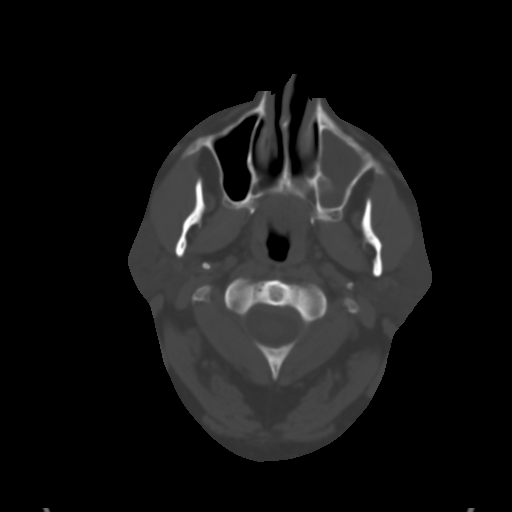
[im 4/36  brain]
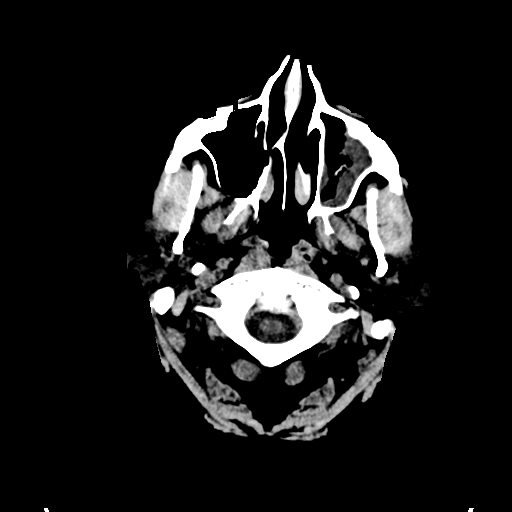
[im 7/36  brain]
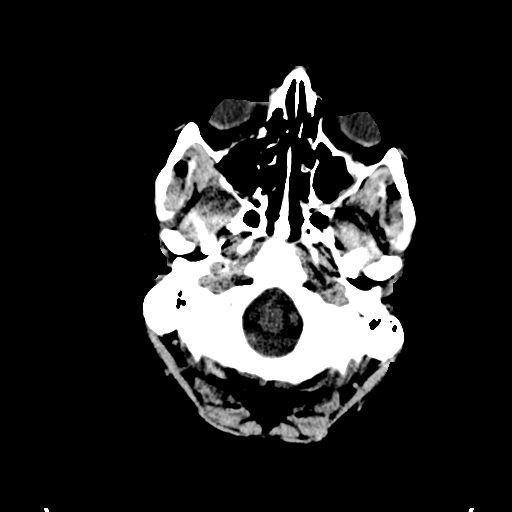
[im 9/36  brain]
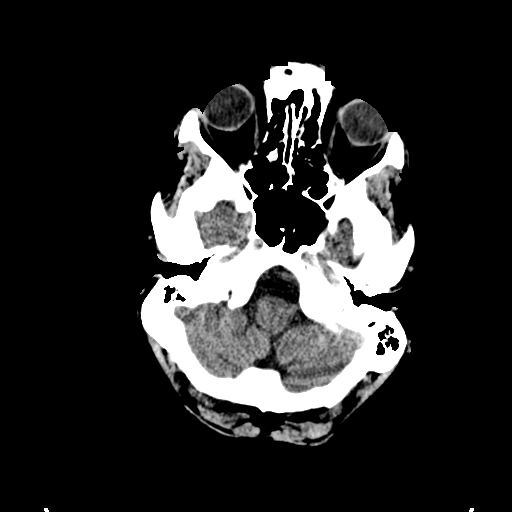
[im 10/36  brain]
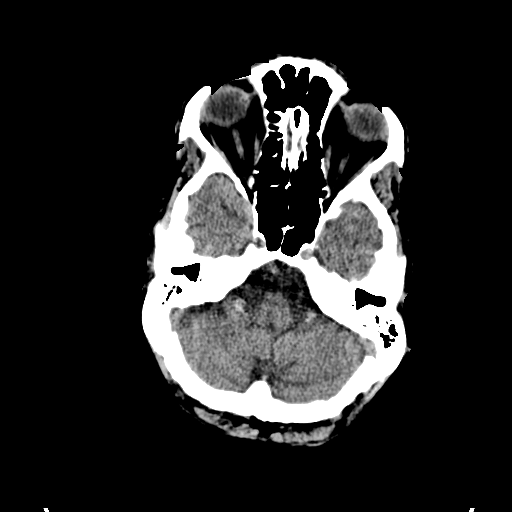
[im 10/36  bone]
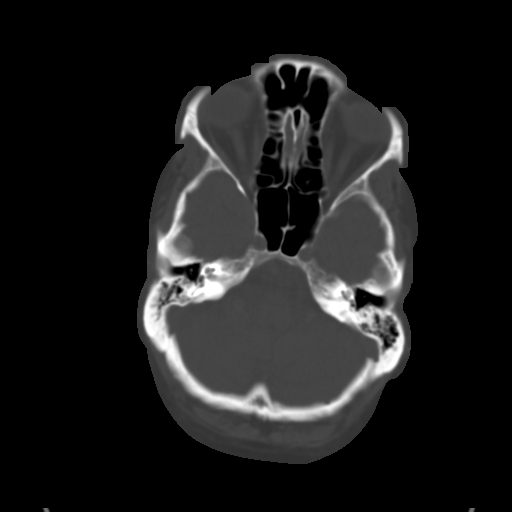
[im 13/36  brain]
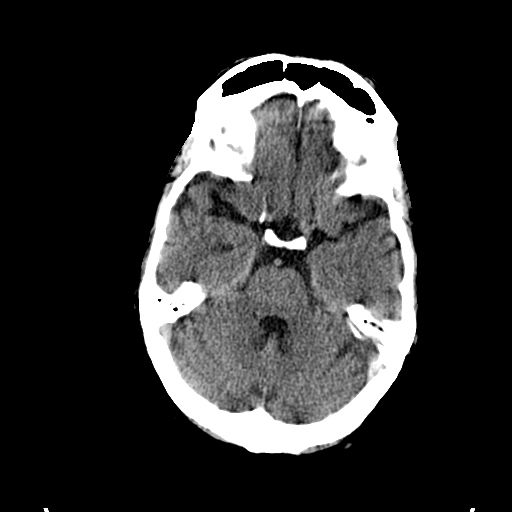
[im 15/36  brain]
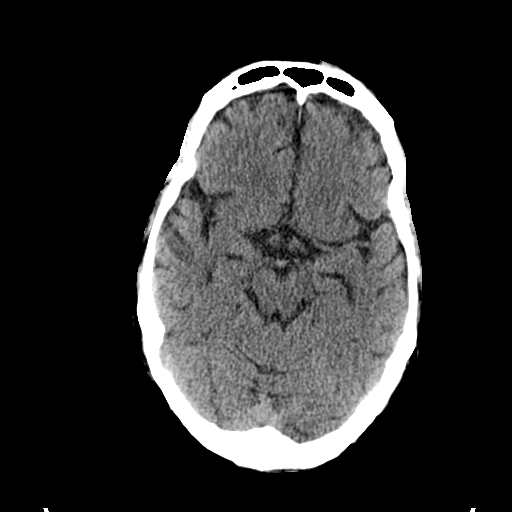
[im 17/36  brain]
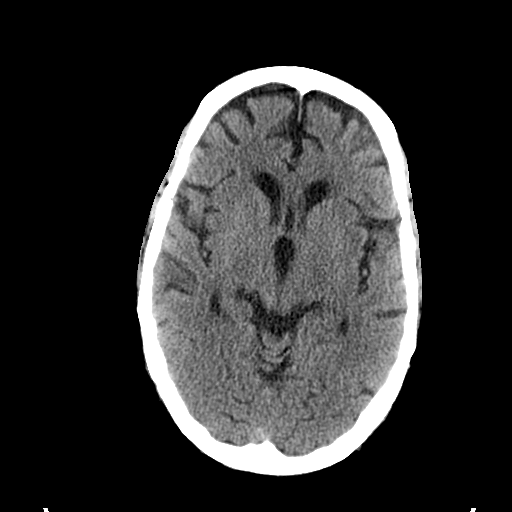
[im 19/36  brain]
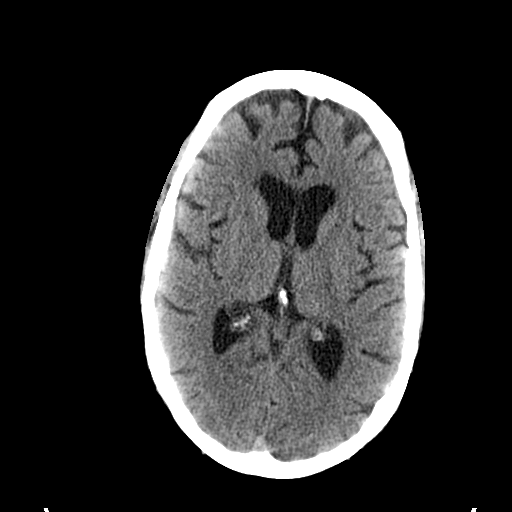
[im 19/36  bone]
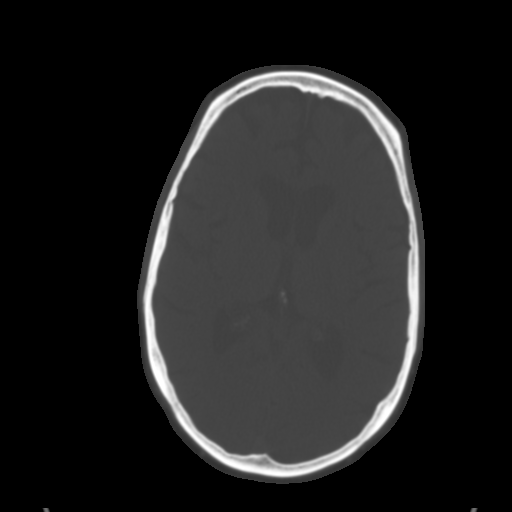
[im 21/36  brain]
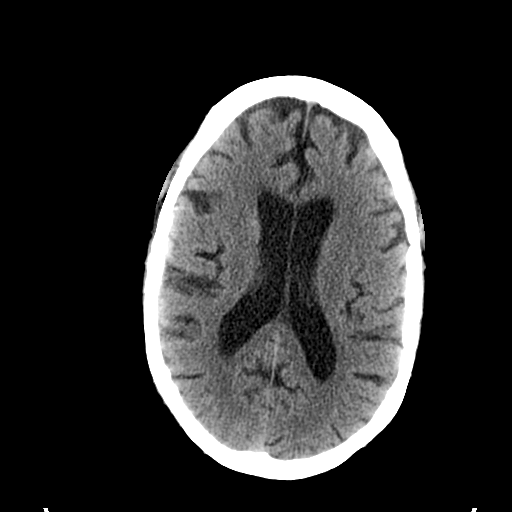
[im 23/36  brain]
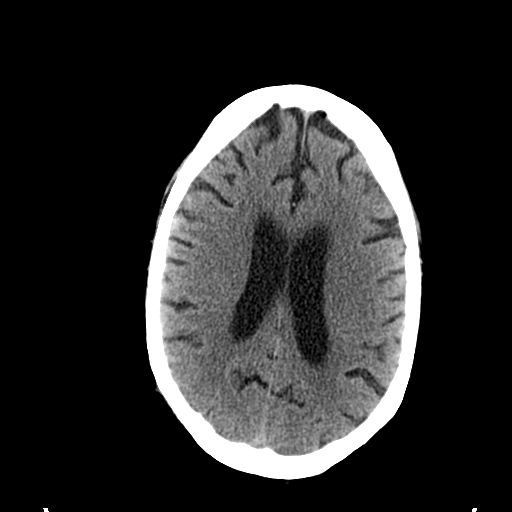
[im 26/36  brain]
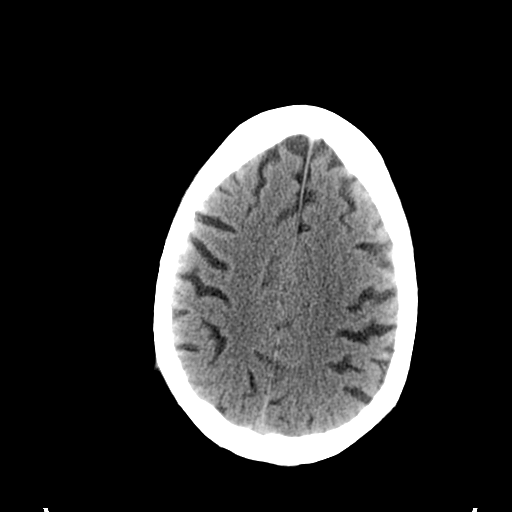
[im 27/36  brain]
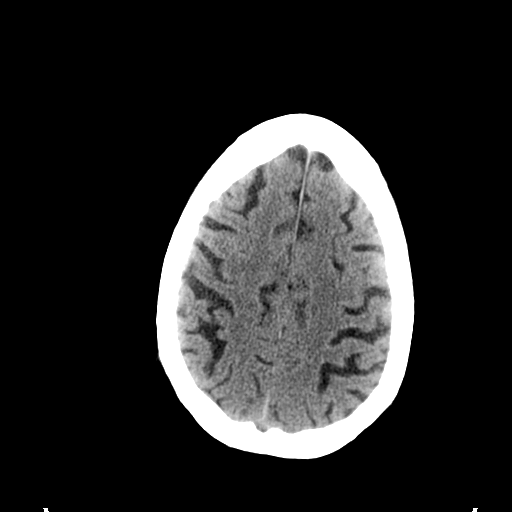
[im 27/36  bone]
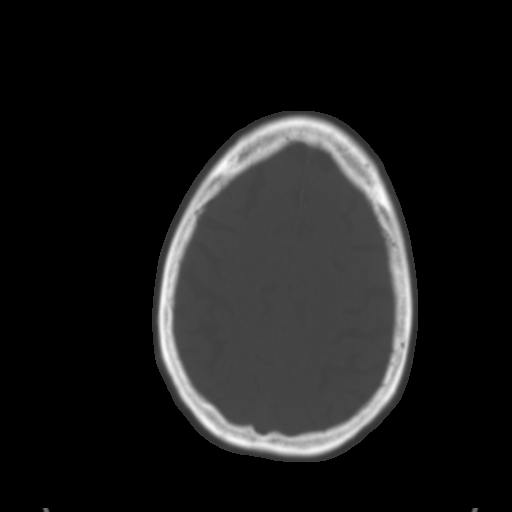
[im 29/36  brain]
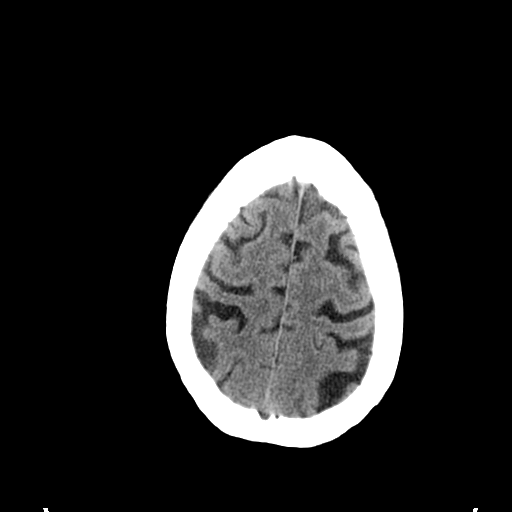
[im 32/36  brain]
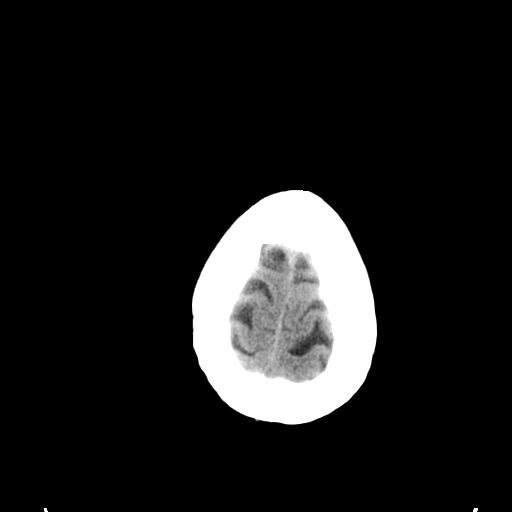
[im 34/36  brain]
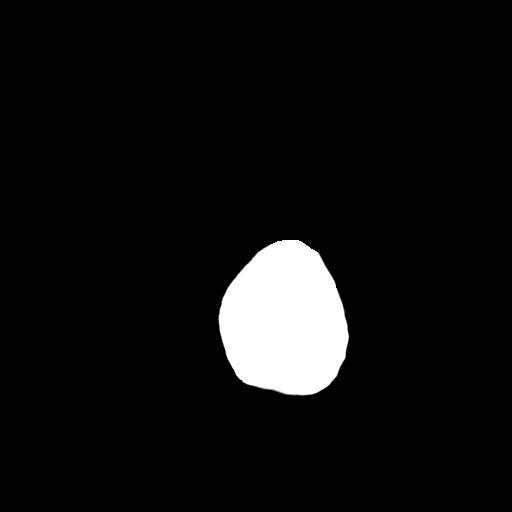

[16 of 30 positions shown; findings below may reference images not displayed]

FINDINGS: Diffuse brain atrophy evident without acute hemorrhage,
definite infarction, mass lesion, midline shift, herniation,
hydrocephalus, focal edema, or extra-axial fluid collection.  Gray-
white matter differentiation maintained.  Cisterns patent.
Cerebellar atrophy as well.  Orbits are symmetric.  Mastoids clear.
Sinuses are clear except for chronic left maxillary sinus disease,
sinusitis not excluded.
IMPRESSION: Stable brain atrophy.  No acute intracranial process by noncontrast
CT.

Left maxillary sinus disease.

## 2013-07-20 IMAGING — CR DG CHEST 2V
2 series · 2 of 2 positions shown · non-contrast
Comparison: None.

CLINICAL DATA: Short of breath and weakness.

CHEST - 2 VIEW

[w chest pa]
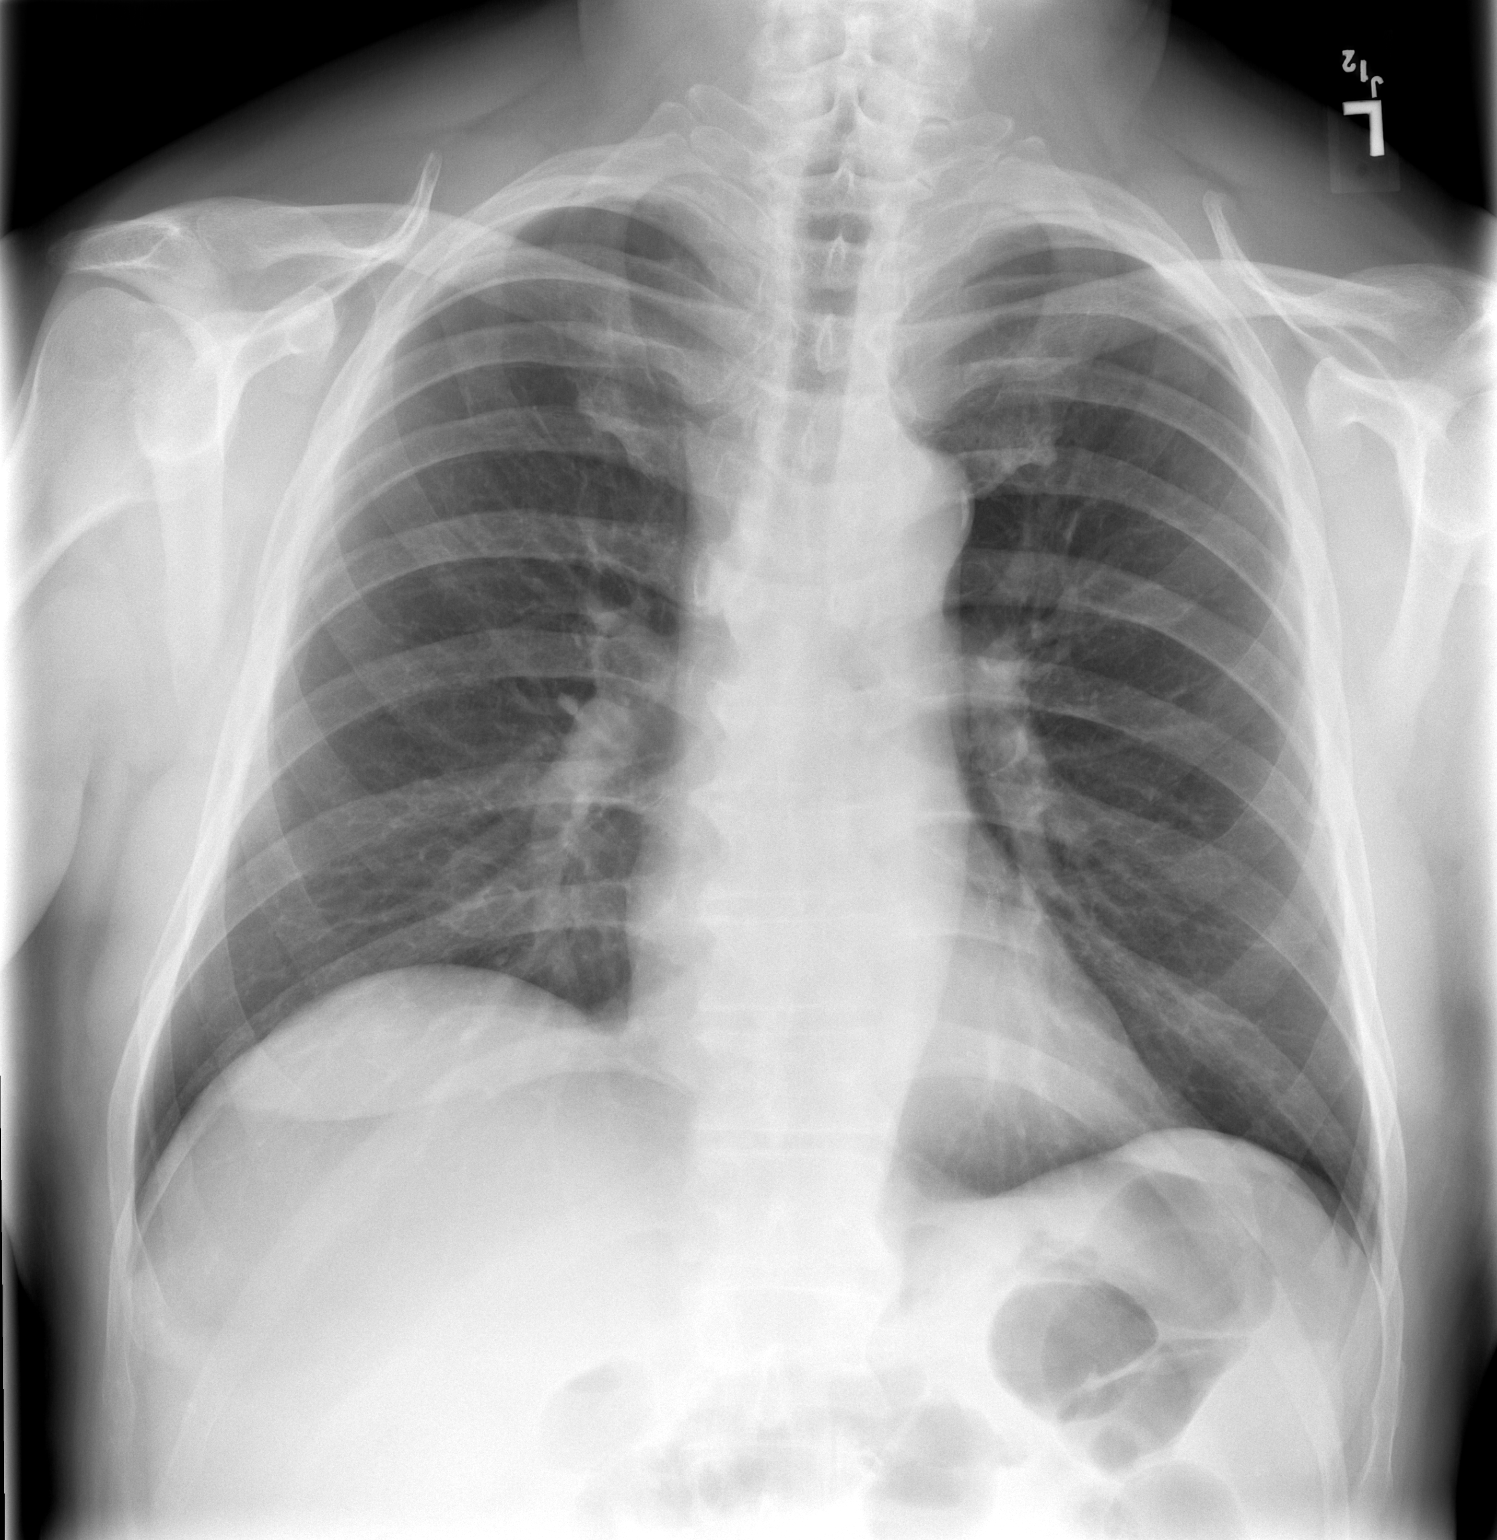

[w chest lat]
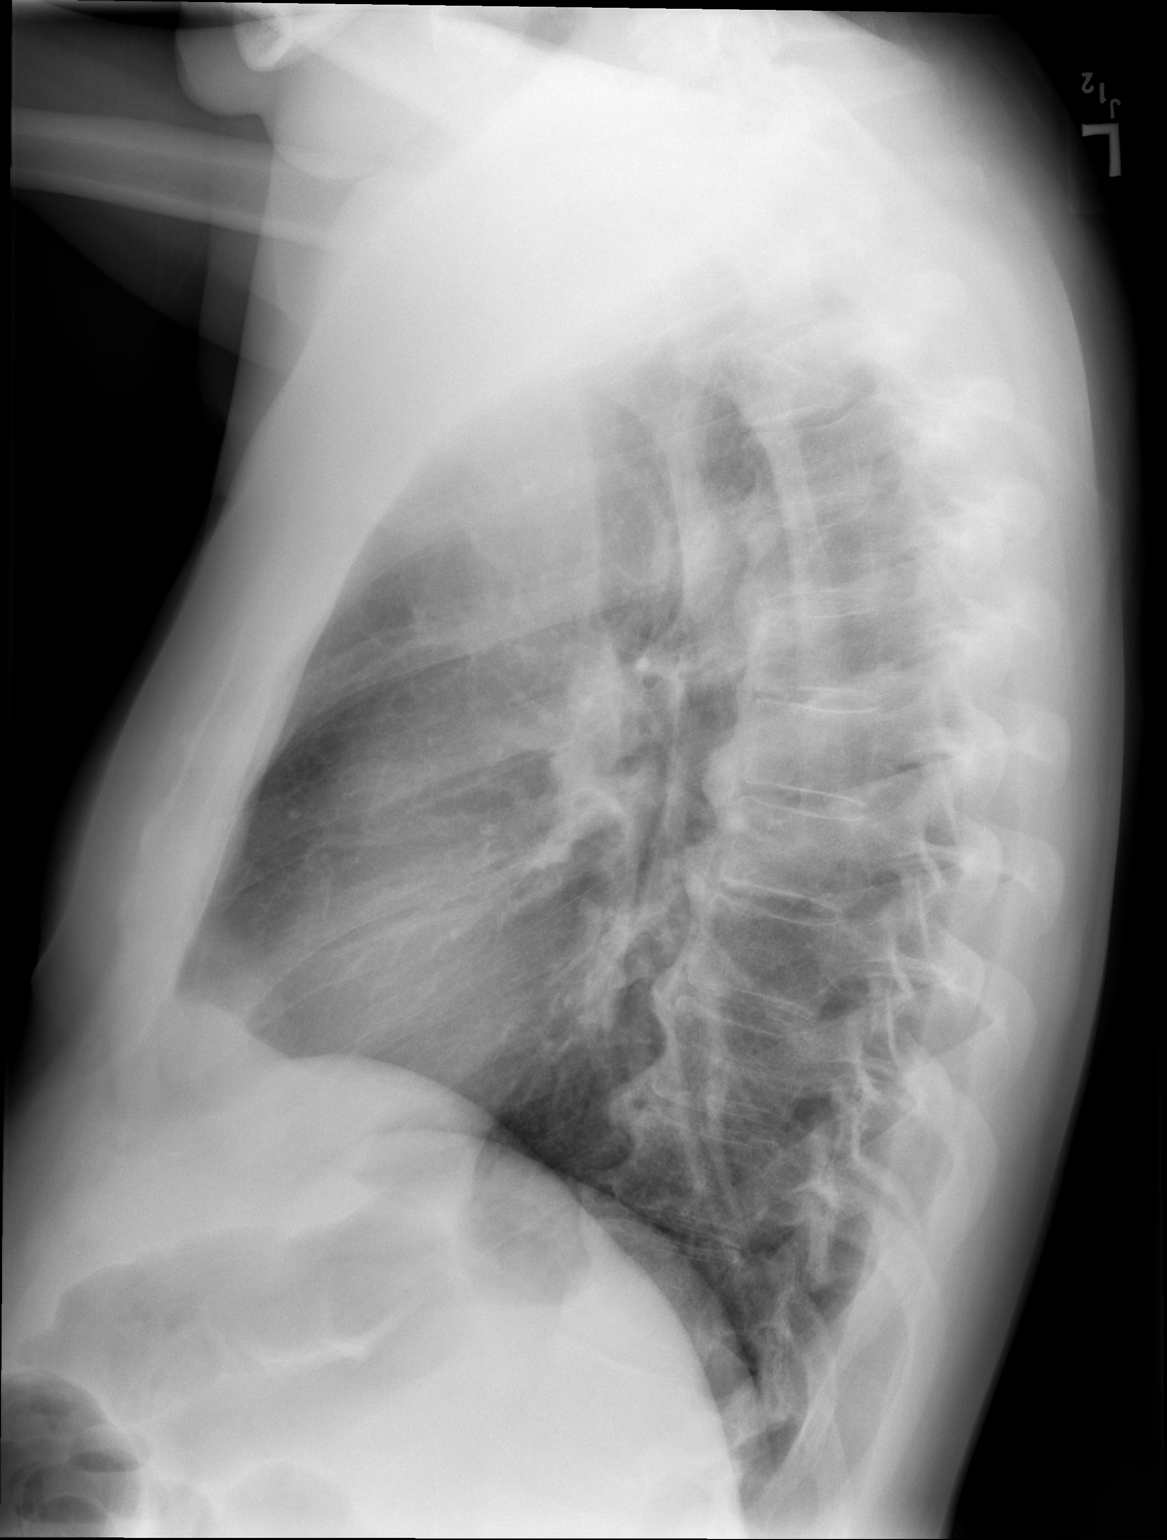

[2 of 2 positions shown; findings below may reference images not displayed]

FINDINGS: Heart size is normal.  Mediastinal shadows are normal.
The vascularity is normal.  Lungs are clear.  No effusions.
Ordinary degenerative changes effect the spine.
IMPRESSION: No active cardiopulmonary disease.

## 2013-07-26 ENCOUNTER — Other Ambulatory Visit: Payer: Self-pay | Admitting: *Deleted

## 2013-07-26 ENCOUNTER — Other Ambulatory Visit: Payer: Self-pay

## 2013-07-26 MED ORDER — ATORVASTATIN CALCIUM 40 MG PO TABS: 40.0000 mg | ORAL_TABLET | Freq: Every day | ORAL | Status: DC

## 2013-07-26 MED ORDER — CLOPIDOGREL BISULFATE 75 MG PO TABS: 75.0000 mg | ORAL_TABLET | Freq: Every day | ORAL | Status: DC

## 2013-08-11 ENCOUNTER — Other Ambulatory Visit: Payer: Self-pay

## 2013-08-11 MED ORDER — CLOPIDOGREL BISULFATE 75 MG PO TABS: 75.0000 mg | ORAL_TABLET | Freq: Every day | ORAL | Status: DC

## 2013-08-11 NOTE — Telephone Encounter (Signed)
Approved      Disp Refills Start End    clopidogrel (PLAVIX) 75 MG tablet 30 tablet 5 07/26/2013      Sig - Route:  Take 1 tablet (75 mg total) by mouth daily. - Oral    Class:  Print    Authorizing Provider:  Quintella Reichertraci R Turner, MD    Ordering User:  Valrie HartMindy L Isley, CMA    Patient Instructions    Your physician has recommended you make the following change in your medication:   1) DECREASE Asprin to 81mg  daily   Your physician recommends that you return for a FASTING lipid profile and alt on 05/23/13 between 7:30am-5:15pm   Your physician wants you to follow-up in: 1 year You will receive a reminder letter in the mail two months in advance. If you don't receive a letter, please call our office to schedule the follow-up appointment  Previous Visit      Provider Department Encounter #    04/22/2013 11:44 AM Quintella ReichertURNER,TRACI R, MD [facility] [id number]                 Visit Pharmacy    [facility] [id number] - [place], [place] - [id number]         Created by    Valrie HartMindy L Isley, CMA on 07/26/2013 03:25 PM

## 2013-08-17 ENCOUNTER — Other Ambulatory Visit: Payer: Self-pay

## 2013-08-17 MED ORDER — CLOPIDOGREL BISULFATE 75 MG PO TABS: 75.0000 mg | ORAL_TABLET | Freq: Every day | ORAL | Status: DC

## 2013-08-17 MED ORDER — ATORVASTATIN CALCIUM 40 MG PO TABS: 40.0000 mg | ORAL_TABLET | Freq: Every day | ORAL | Status: DC

## 2013-11-02 ENCOUNTER — Telehealth: Payer: Self-pay | Admitting: Cardiology

## 2013-11-02 NOTE — Telephone Encounter (Signed)
New message          Pt states his ins will only cover his labs through [facility] / Can he get a written order to get it done there

## 2013-11-02 NOTE — Telephone Encounter (Signed)
Pt is aware. Rx up front to pick up.

## 2013-11-02 NOTE — Telephone Encounter (Signed)
On Dr Malachy Mood desk to sign so I can let pt pick up.

## 2013-11-20 ENCOUNTER — Other Ambulatory Visit: Payer: Self-pay

## 2013-11-22 ENCOUNTER — Other Ambulatory Visit: Payer: Self-pay

## 2014-02-13 ENCOUNTER — Other Ambulatory Visit: Payer: Self-pay | Admitting: Cardiology

## 2014-02-13 NOTE — Telephone Encounter (Signed)
Quintella Reichertraci R Turner, MD at 05/22/2013 10:00 AM  clopidogrel (PLAVIX) 75 MG tablet  Take 1 tablet (75 mg total) by mouth daily  Patient Instructions     Your physician has recommended you make the following change in your medication:  1) DECREASE Asprin to 81mg  daily  Your physician recommends that you return for a FASTING lipid profile and alt on 05/23/13 between 7:30am-5:15pm  Your physician wants you to follow-up in: 1 year You will receive a reminder letter in the mail two months in advance. If you don't receive a letter, please call our office to schedule the follow-up appointment

## 2014-02-26 ENCOUNTER — Other Ambulatory Visit: Payer: Self-pay | Admitting: Cardiology

## 2014-03-01 ENCOUNTER — Other Ambulatory Visit: Payer: Self-pay | Admitting: Cardiology

## 2014-03-14 ENCOUNTER — Encounter: Payer: Self-pay | Admitting: Cardiology

## 2014-04-30 ENCOUNTER — Other Ambulatory Visit: Payer: Self-pay | Admitting: Cardiology

## 2014-06-12 ENCOUNTER — Other Ambulatory Visit: Payer: Self-pay | Admitting: Cardiology

## 2014-06-12 ENCOUNTER — Ambulatory Visit: Payer: Self-pay | Admitting: Cardiology

## 2014-06-25 ENCOUNTER — Telehealth: Payer: Self-pay | Admitting: Cardiology

## 2014-06-25 NOTE — Telephone Encounter (Signed)
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  Patient want a letter from Dr Mayford Knifeurner stating he does not need a stress test.  Pt said Dr Mayford Knifeurner did this years ago.

## 2014-06-25 NOTE — Telephone Encounter (Signed)
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 Patient st last time, Dr. Radford Pax wrote a letter saying it wasn't necessary to have a stress test and cleared him. Attempted to call [facility] in [place] to ask about patient's eligibility and qualifications needed to be met, and if GXT is necessary. Patient could not remember provider's name so [facility] would not let me through.  Patient has OV June 1 with Dr. Radford Pax.

## 2014-06-25 NOTE — Telephone Encounter (Signed)
Spoke with Marylu LundJanet at [facility] [phone]).  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   Left message for patient to call back.

## 2014-06-26 NOTE — Telephone Encounter (Signed)
Follow up     Patient returning call back to nurse .  Can leave message on phone.

## 2014-06-26 NOTE — Telephone Encounter (Signed)
Left message to call back  

## 2014-06-26 NOTE — Telephone Encounter (Signed)
Pt rtn call--called katie NA, pls rtn call

## 2014-06-26 NOTE — Telephone Encounter (Signed)
Informed patient that per [facility], DOT st he is required to have GXT every other year. 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 He st he will FU at the OV in a few weeks and if he has changed his mind he will let us know.

## 2014-07-02 ENCOUNTER — Other Ambulatory Visit: Payer: Self-pay

## 2014-07-02 MED ORDER — ATORVASTATIN CALCIUM 40 MG PO TABS: 40.0000 mg | ORAL_TABLET | Freq: Every day | ORAL | Status: DC

## 2014-07-17 NOTE — Progress Notes (Signed)
Cardiology Office Note   Date:  07/18/2014   ID:  Sean [name], DOB [birth year], MRN [id number]  PCP:  Farris HasMORROW, AARON, MD    Chief Complaint  Patient presents with  . Follow-up    essential hypertension, CAD, dyslipidemia      History of Present Illness: Sean [name] is a 64 y.o. male with a history of ASCAD s/p BMS to LAD, dyslipidemia and HTN who presents today for followup. He is doing well. He denies any chest pain, SOB, DOE, LE edema, dizziness, palpitations or syncope. He walks for exercise without any problems.    Past Medical History  Diagnosis Date  . Alcohol addiction     2010- admitted for withdrawal , detox admission after that  . Mental disorder   . Depression   . Peptic ulcer   . ED (erectile dysfunction)   . HTN (hypertension)   . HLD (hyperlipidemia)   . Coronary artery disease     95% mid LAD s/p BMS of LAD with aneurysmal segment, other non critical obstruction, put on plavix and aspirin 325    Past Surgical History  Procedure Laterality Date  . Coronary stent placement       Current Outpatient Prescriptions  Medication Sig Dispense Refill  . aspirin 81 MG EC tablet Take 1 tablet (81 mg total) by mouth daily.    Marland Kitchen. atorvastatin (LIPITOR) 40 MG tablet Take 1 tablet (40 mg total) by mouth daily. 30 tablet 1  . clopidogrel (PLAVIX) 75 MG tablet TAKE 1 TABLET BY MOUTH DAILY 90 tablet 0  . Multiple Vitamin (MULITIVITAMIN WITH MINERALS) TABS Take 1 tablet by mouth daily.    . vitamin C (ASCORBIC ACID) 500 MG tablet Take 500 mg by mouth daily.     No current facility-administered medications for this visit.    Allergies:   Review of patient's allergies indicates no known allergies.    Social History:  The patient  reports that he has quit smoking. He quit smokeless tobacco use about 26 years ago. He reports that he does not drink alcohol or use illicit drugs.   Family History:  The patient's family history includes Prostate  cancer in his father. There is no history of Anesthesia problems, Hypotension, Malignant hyperthermia, or Pseudochol deficiency.    ROS:  Please see the history of present illness.   Otherwise, review of systems are positive for none.   All other systems are reviewed and negative.    PHYSICAL EXAM: VS:  BP 130/80 mmHg  Pulse 70  Ht 5\' 11"  (1.803 m)  Wt 191 lb (86.637 kg)  BMI 26.65 kg/m2 , BMI Body mass index is 26.65 kg/(m^2). GEN: Well nourished, well developed, in no acute distress HEENT: normal Neck: no JVD, carotid bruits, or masses Cardiac: RRR; no murmurs, rubs, or gallops,no edema  Respiratory:  clear to auscultation bilaterally, normal work of breathing GI: soft, nontender, nondistended, + BS MS: no deformity or atrophy Skin: warm and dry, no rash Neuro:  Strength and sensation are intact Psych: euthymic mood, full affect   EKG:  EKG was ordered today and showed NSR with no ST changes     Recent Labs: No results found for requested labs within last 365 days.    Lipid Panel    Component Value Date/Time   CHOL 103 05/23/2013 0936   TRIG 61.0 05/23/2013 0936   HDL 39.40 05/23/2013 0936  CHOLHDL 3 05/23/2013 0936   VLDL 12.2 05/23/2013 0936   LDLCALC 51 05/23/2013 0936      Wt Readings from Last 3 Encounters:  07/18/14 191 lb (86.637 kg)  05/22/13 204 lb 12.8 oz (92.897 kg)  05/05/11 177 lb 9.6 oz (80.559 kg)    ASSESSMENT AND PLAN:  1. ASCAD s/p PCI of LAD with no angina - continue ASA/Plavix  2. HTN well controlled 3. Dyslipidemia - at goal 10/14 - recheck statin panel and ALT with PCP in 3 weeks - continue Atorvastatin    Current medicines are reviewed at length with the patient today.  The patient does not have concerns regarding medicines.  The following changes have been made:  no change  Labs/ tests ordered today: See above Assessment and Plan No orders of the defined types were placed in this encounter.     Disposition:   FU with  me in 1 year  Signed, Quintella Reichert, MD  07/18/2014 9:03 AM    [facility] [address] [place], [place], [place]  [address] Phone: [phone]; Fax: [phone]

## 2014-07-18 ENCOUNTER — Encounter: Payer: Self-pay | Admitting: Cardiology

## 2014-07-18 ENCOUNTER — Ambulatory Visit (INDEPENDENT_AMBULATORY_CARE_PROVIDER_SITE_OTHER): Payer: BLUE CROSS/BLUE SHIELD | Admitting: Cardiology

## 2014-07-18 VITALS — BP 130/80 | HR 70 | Ht 71.0 in | Wt 191.0 lb

## 2014-07-18 DIAGNOSIS — I10 Essential (primary) hypertension: Secondary | ICD-10-CM

## 2014-07-18 DIAGNOSIS — I2510 Atherosclerotic heart disease of native coronary artery without angina pectoris: Secondary | ICD-10-CM

## 2014-07-18 DIAGNOSIS — E785 Hyperlipidemia, unspecified: Secondary | ICD-10-CM

## 2014-07-18 NOTE — Patient Instructions (Signed)

## 2014-07-19 ENCOUNTER — Ambulatory Visit: Payer: Self-pay | Admitting: Cardiology

## 2014-08-16 ENCOUNTER — Encounter: Payer: Self-pay | Admitting: Cardiology

## 2014-08-16 ENCOUNTER — Telehealth: Payer: Self-pay

## 2014-08-16 DIAGNOSIS — R000 Tachycardia, unspecified: Secondary | ICD-10-CM

## 2014-08-16 DIAGNOSIS — I2510 Atherosclerotic heart disease of native coronary artery without angina pectoris: Secondary | ICD-10-CM

## 2014-08-16 NOTE — Telephone Encounter (Signed)
That is fine 

## 2014-08-16 NOTE — Telephone Encounter (Signed)
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 Informed he will be called after I speak with Dr. Mayford Knifeurner.

## 2014-08-16 NOTE — Telephone Encounter (Signed)
Please find out if they require a nuclear stress test or is ETT ok

## 2014-08-16 NOTE — Telephone Encounter (Signed)
Patient requesting regular exercise tolerance test.

## 2014-08-16 NOTE — Telephone Encounter (Signed)
New problem    Pt calling back in to confirm that he is needing an Exercise Tolerance Test ASAP .

## 2014-08-16 NOTE — Telephone Encounter (Signed)
error 

## 2014-08-16 NOTE — Telephone Encounter (Signed)
This encounter was created in error - please disregard.

## 2014-08-17 NOTE — Telephone Encounter (Signed)
ETT order placed in [facility] and form [facility]. [facility] and Karsten FellsKaty Kemp, RN, to ensure order is complete.

## 2014-08-17 NOTE — Addendum Note (Signed)
Addended by: Shela NevinEGLESTON, Rishita Petron D on: 08/17/2014 05:04 PM   Modules accepted: Orders

## 2014-08-28 NOTE — Telephone Encounter (Signed)
ETT scheduled for 7/26.

## 2014-09-06 ENCOUNTER — Telehealth (HOSPITAL_COMMUNITY): Payer: Self-pay

## 2014-09-06 NOTE — Telephone Encounter (Signed)
Encounter complete. 

## 2014-09-11 ENCOUNTER — Ambulatory Visit (HOSPITAL_COMMUNITY)
Admission: RE | Admit: 2014-09-11 | Discharge: 2014-09-11 | Disposition: A | Discharge status: Home / Self Care | Payer: BLUE CROSS/BLUE SHIELD | Source: Ambulatory Visit | Attending: Cardiology | Admitting: Cardiology

## 2014-09-11 ENCOUNTER — Other Ambulatory Visit: Payer: Self-pay | Admitting: Cardiology

## 2014-09-11 ENCOUNTER — Encounter (HOSPITAL_COMMUNITY): Payer: Self-pay | Admitting: *Deleted

## 2014-09-11 DIAGNOSIS — R9439 Abnormal result of other cardiovascular function study: Secondary | ICD-10-CM | POA: Diagnosis not present

## 2014-09-11 DIAGNOSIS — I2510 Atherosclerotic heart disease of native coronary artery without angina pectoris: Secondary | ICD-10-CM | POA: Insufficient documentation

## 2014-09-11 DIAGNOSIS — R000 Tachycardia, unspecified: Secondary | ICD-10-CM

## 2014-09-11 LAB — EXERCISE TOLERANCE TEST
CHL CUP MPHR: 157 {beats}/min
CHL CUP RESTING HR STRESS: 87 {beats}/min
CSEPED: 7 min
CSEPEW: 8.5 METS
CSEPHR: 105 %
Peak HR: 166 {beats}/min
RPE: 16

## 2014-09-11 NOTE — Progress Notes (Unsigned)
ETT was taken to Dr. Jens Som for review and patient was ok'd to leave,

## 2014-09-12 ENCOUNTER — Telehealth: Payer: Self-pay

## 2014-09-12 DIAGNOSIS — R9431 Abnormal electrocardiogram [ECG] [EKG]: Secondary | ICD-10-CM

## 2014-09-12 NOTE — Telephone Encounter (Signed)
Informed patient of results and verbal understanding expressed.  Reviewed instructions with patient and stress myoview ordered for scheduling.

## 2014-09-12 NOTE — Telephone Encounter (Signed)
-----   Message from Quintella Reichert, MD sent at 09/11/2014  7:59 PM EDT ----- Please let patient know that stress test was abnormal and order a stress myoview

## 2014-09-13 ENCOUNTER — Telehealth: Payer: Self-pay

## 2014-09-13 NOTE — Telephone Encounter (Signed)
Spoke with patient in detail about stress test results and reiterated the necessity for having a myoview. Patient adamantly refuses. He st he is asymptomatic and will not do the test. He st he will see Dr. Mayford Knife at his annual. Encouraged the patient to reconsider due to abnormal stress test.  Instructed him to call back if he changes his mind.

## 2014-09-13 NOTE — Telephone Encounter (Signed)
-----   Message from Gerome Sam sent at 09/13/2014 10:29 AM EDT ----- Regarding: [employer],  Please call Mr.Pifer he would like to talk to you about his Burna Sis is going to cancel the test,Just let me know what to do.  Thanks-----:)

## 2014-09-27 ENCOUNTER — Other Ambulatory Visit: Payer: Self-pay | Admitting: Cardiology

## 2014-09-27 NOTE — Telephone Encounter (Signed)
Lab work requested. 

## 2014-09-27 NOTE — Telephone Encounter (Signed)
Patient has not had lipid panel in over one year. Please advise on refills. Thanks, MI

## 2014-09-27 NOTE — Telephone Encounter (Signed)
Get copy of lipids from PCP

## 2014-10-09 ENCOUNTER — Encounter: Payer: Self-pay | Admitting: Cardiology

## 2014-10-09 NOTE — Telephone Encounter (Signed)
Ok to refil 

## 2014-10-09 NOTE — Telephone Encounter (Signed)
Dr. Mayford Knife,  The patient's lipid panel is scanned in the patient's chart dated 08/16/14.

## 2015-06-06 ENCOUNTER — Other Ambulatory Visit: Payer: Self-pay | Admitting: Cardiology

## 2015-10-18 ENCOUNTER — Other Ambulatory Visit: Payer: Self-pay | Admitting: Cardiology

## 2015-10-24 ENCOUNTER — Encounter (INDEPENDENT_AMBULATORY_CARE_PROVIDER_SITE_OTHER): Payer: Self-pay

## 2015-10-24 ENCOUNTER — Ambulatory Visit (INDEPENDENT_AMBULATORY_CARE_PROVIDER_SITE_OTHER): Payer: Medicare Other | Admitting: Cardiology

## 2015-10-24 ENCOUNTER — Encounter: Payer: Self-pay | Admitting: Cardiology

## 2015-10-24 VITALS — BP 126/66 | HR 55 | Ht 71.0 in | Wt 181.6 lb

## 2015-10-24 DIAGNOSIS — E785 Hyperlipidemia, unspecified: Secondary | ICD-10-CM | POA: Diagnosis not present

## 2015-10-24 DIAGNOSIS — I4510 Unspecified right bundle-branch block: Secondary | ICD-10-CM | POA: Diagnosis not present

## 2015-10-24 DIAGNOSIS — I10 Essential (primary) hypertension: Secondary | ICD-10-CM | POA: Diagnosis not present

## 2015-10-24 DIAGNOSIS — I2510 Atherosclerotic heart disease of native coronary artery without angina pectoris: Secondary | ICD-10-CM

## 2015-10-24 DIAGNOSIS — I2583 Coronary atherosclerosis due to lipid rich plaque: Principal | ICD-10-CM

## 2015-10-24 DIAGNOSIS — R9431 Abnormal electrocardiogram [ECG] [EKG]: Secondary | ICD-10-CM

## 2015-10-24 HISTORY — DX: Unspecified right bundle-branch block: I45.10

## 2015-10-24 NOTE — Patient Instructions (Signed)
Medication Instructions:  Your physician recommends that you continue on your current medications as directed. Please refer to the Current Medication list given to you today.   Labwork: TODAY: LFTs, Lipids  Testing/Procedures: Dr. Turner recommends you have a NUCLEAR STRESS TEST.  Follow-Up: Your physician wants you to follow-up in: 1 year with Dr. Turner. You will receive a reminder letter in the mail two months in advance. If you don't receive a letter, please call our office to schedule the follow-up appointment.   Any Other Special Instructions Will Be Listed Below (If Applicable).     If you need a refill on your cardiac medications before your next appointment, please call your pharmacy.   

## 2015-10-24 NOTE — Progress Notes (Signed)
Cardiology Office Note    Date:  10/24/2015   ID:  Sean [name], DOB 10/22/1950, MRN [id number]  PCP:  [name], MD  Cardiologist:  Armanda Magic, MD   Chief Complaint  Patient presents with  . Coronary Artery Disease  . Hypertension  . Hyperlipidemia    History of Present Illness:  Sean [name] is a 65 y.o. male with a history of ASCAD s/p BMS to LAD, dyslipidemia and HTN who presents today for followup. He is doing well. He denies any chest pain, SOB, DOE, LE edema, dizziness, palpitations, claudication or syncope. He walks for exercise without any problems.   Past Medical History:  Diagnosis Date  . Alcohol addiction (HCC)    2010- admitted for withdrawal , detox admission after that  . Coronary artery disease    95% mid LAD s/p BMS of LAD with aneurysmal segment, other non critical obstruction, put on plavix and aspirin 325  . Depression   . ED (erectile dysfunction)   . HLD (hyperlipidemia)   . HTN (hypertension)   . Mental disorder   . Peptic ulcer   . RBBB 10/24/2015    Past Surgical History:  Procedure Laterality Date  . CORONARY STENT PLACEMENT      Current Medications: Outpatient Medications Prior to Visit  Medication Sig Dispense Refill  . aspirin 81 MG EC tablet Take 1 tablet (81 mg total) by mouth daily.    Marland Kitchen atorvastatin (LIPITOR) 40 MG tablet TAKE 1 TABLET BY MOUTH DAILY. 90 tablet 0  . clopidogrel (PLAVIX) 75 MG tablet TAKE 1 TABLET BY MOUTH DAILY 90 tablet 0  . Multiple Vitamin (MULITIVITAMIN WITH MINERALS) TABS Take 1 tablet by mouth daily.    . vitamin C (ASCORBIC ACID) 500 MG tablet Take 500 mg by mouth daily.     No facility-administered medications prior to visit.      Allergies:   Review of patient's allergies indicates no known allergies.   Social History   Social History  . Marital status: Married    Spouse name: N/A  . Number of children: N/A  . Years of education: N/A   Social History Main Topics  . Smoking status:  Former Games developer  . Smokeless tobacco: Former Neurosurgeon    Quit date: 05/05/1988  . Alcohol use No     Comment: quit 07/2006  . Drug use: No  . Sexual activity: Yes    Birth control/ protection: None     Comment: Married   Other Topics Concern  . None   Social History Narrative   Live with wife in [place], is an Art gallery manager, currently driving trucks for a local firm. Quit smoking 1990 after smoking for 20 years. Drinks heavilyu (1 bottle of vodka or 18-24 beers a day). Used to smoke marijuana but quit in 1990s. Has united healthcare insurance. Goes to [employer] for alcohol cessation     Family History:  The patient's family history includes Heart attack (age of onset: 5) in his brother; Prostate cancer in his father.   ROS:   Please see the history of present illness.    ROS All other systems reviewed and are negative.   PHYSICAL EXAM:   VS:  BP 126/66   Pulse (!) 55   Ht 5\' 11"  (1.803 m)   Wt 181 lb 9.6 oz (82.4 kg)   BMI 25.33 kg/m    GEN: Well nourished, well developed, in no acute distress  HEENT: normal  Neck: no JVD, carotid  bruits, or masses Cardiac: RRR; no murmurs, rubs, or gallops,no edema.  Intact distal pulses bilaterally.  Respiratory:  clear to auscultation bilaterally, normal work of breathing GI: soft, nontender, nondistended, + BS MS: no deformity or atrophy  Skin: warm and dry, no rash Neuro:  Alert and Oriented x 3, Strength and sensation are intact Psych: euthymic mood, full affect  Wt Readings from Last 3 Encounters:  10/24/15 181 lb 9.6 oz (82.4 kg)  07/18/14 191 lb (86.6 kg)  05/22/13 204 lb 12.8 oz (92.9 kg)      Studies/Labs Reviewed:   EKG:  EKG is  ordered today.  The ekg ordered today demonstrates sinus bradycardai at 55bpm with RBBB  Recent Labs: No results found for requested labs within last 8760 hours.   Lipid Panel    Component Value Date/Time   CHOL 103 05/23/2013 0936   TRIG 61.0 05/23/2013 0936   HDL 39.40 05/23/2013 0936    CHOLHDL 3 05/23/2013 0936   VLDL 12.2 05/23/2013 0936   LDLCALC 51 05/23/2013 0936    Additional studies/ records that were reviewed today include:  none    ASSESSMENT:    1. Coronary artery disease due to lipid rich plaque   2. Essential hypertension   3. HLD (hyperlipidemia)   4. RBBB      PLAN:  In order of problems listed above:  1. ASCAD s/p BMS to LAD remotely with abnormal ETT test a year ago - see below - will get stress myoview to rule out ischemia given new RBBB and abnormal stress test a year go without proceeding with myoview due to lack of insurance.   Continue ASA/plavix and statin.   2. HTN - BP controlled on current meds.   3. Hyperlipidemia with LDL goal < 70.  I will check an FLP and ALT. 4. RBBB - this is new but he is asymptomatic.  He had an ETT that was positive for ischemia a year ago but patient refused myoview at that time due to lack of insurance but now has insurance and wants to get it done now. I think this is appropriate given new EKG changes.  Will get Stress myoview to rule out ischemia.     Medication Adjustments/Labs and Tests Ordered: Current medicines are reviewed at length with the patient today.  Concerns regarding medicines are outlined above.  Medication changes, Labs and Tests ordered today are listed in the Patient Instructions below.  There are no Patient Instructions on file for this visit.   Signed, Armanda Magicraci Ailyn Gladd, MD  10/24/2015 10:11 AM    [facility] [address], [place], [place]  [address] Phone: [phone]; Fax: [phone]

## 2015-10-30 ENCOUNTER — Telehealth (HOSPITAL_COMMUNITY): Payer: Self-pay | Admitting: *Deleted

## 2015-10-30 NOTE — Telephone Encounter (Signed)
Left message on voicemail per DPR in reference to upcoming appointment scheduled on 11/01/15 with detailed instructions given per Myocardial Perfusion Study Information Sheet for the test. LM to arrive 15 minutes early, and that it is imperative to arrive on time for appointment to keep from having the test rescheduled. If you need to cancel or reschedule your appointment, please call the office within 24 hours of your appointment. Failure to do so may result in a cancellation of your appointment, and a $50 no show fee. Phone number given for call back for any questions. Sean [name]   

## 2015-11-01 ENCOUNTER — Ambulatory Visit (HOSPITAL_COMMUNITY): Payer: Medicare Other | Attending: Cardiology

## 2015-11-01 ENCOUNTER — Telehealth: Payer: Self-pay | Admitting: Cardiology

## 2015-11-01 DIAGNOSIS — I2583 Coronary atherosclerosis due to lipid rich plaque: Secondary | ICD-10-CM

## 2015-11-01 DIAGNOSIS — I2510 Atherosclerotic heart disease of native coronary artery without angina pectoris: Secondary | ICD-10-CM | POA: Diagnosis present

## 2015-11-01 DIAGNOSIS — R9431 Abnormal electrocardiogram [ECG] [EKG]: Secondary | ICD-10-CM | POA: Diagnosis not present

## 2015-11-01 LAB — MYOCARDIAL PERFUSION IMAGING
CHL CUP NUCLEAR SRS: 1
CSEPED: 8 min
CSEPEDS: 16 s
CSEPEW: 10.1 METS
LV dias vol: 111 mL (ref 62–150)
LV sys vol: 41 mL
MPHR: 156 {beats}/min
NUC STRESS TID: 1.08
Peak HR: 139 {beats}/min
Percent HR: 89 %
RATE: 0.28
Rest HR: 55 {beats}/min
SDS: 2
SSS: 3

## 2015-11-01 MED ORDER — TECHNETIUM TC 99M TETROFOSMIN IV KIT
32.3000 | PACK | Freq: Once | INTRAVENOUS | Status: AC | PRN
  Administered 2015-11-01: 32.3 via INTRAVENOUS
  Filled 2015-11-01: qty 32

## 2015-11-01 MED ORDER — TECHNETIUM TC 99M TETROFOSMIN IV KIT
10.3000 | PACK | Freq: Once | INTRAVENOUS | Status: AC | PRN
  Administered 2015-11-01: 10 via INTRAVENOUS
  Filled 2015-11-01: qty 10

## 2015-11-01 NOTE — Telephone Encounter (Signed)
New Message ° °Pt voiced he is returning nurses call. ° °Please f/u with pt. °

## 2015-11-01 NOTE — Telephone Encounter (Signed)
Notified of stress test results. 

## 2015-11-13 ENCOUNTER — Other Ambulatory Visit: Payer: Self-pay

## 2015-11-13 ENCOUNTER — Telehealth: Payer: Self-pay | Admitting: Cardiology

## 2015-11-13 DIAGNOSIS — E785 Hyperlipidemia, unspecified: Secondary | ICD-10-CM

## 2015-11-13 NOTE — Telephone Encounter (Signed)
Patient called to receive lab results he states he had drawn the day of his stress test. Informed patient that lab work was not drawn that day and there are no results. Scheduled patient for his yearly fasting lab appointment Monday, October 2. Patient agrees with treatment plan.

## 2015-11-13 NOTE — Telephone Encounter (Signed)
New message  ° ° °Pt calling to get test results.  °

## 2015-11-18 ENCOUNTER — Other Ambulatory Visit: Payer: Medicare Other | Admitting: *Deleted

## 2015-11-18 DIAGNOSIS — E785 Hyperlipidemia, unspecified: Secondary | ICD-10-CM

## 2015-11-18 LAB — HEPATIC FUNCTION PANEL
ALBUMIN: 4.1 g/dL (ref 3.6–5.1)
ALK PHOS: 60 U/L (ref 40–115)
ALT: 19 U/L (ref 9–46)
AST: 21 U/L (ref 10–35)
Bilirubin, Direct: 0.2 mg/dL (ref <=0.2)
Indirect Bilirubin: 0.4 mg/dL (ref 0.2–1.2)
TOTAL PROTEIN: 6.5 g/dL (ref 6.1–8.1)
Total Bilirubin: 0.6 mg/dL (ref 0.2–1.2)

## 2015-11-18 LAB — LIPID PANEL
Cholesterol: 108 mg/dL — ABNORMAL LOW (ref 125–200)
HDL: 49 mg/dL (ref >=40)
LDL CALC: 46 mg/dL (ref <130)
TRIGLYCERIDES: 66 mg/dL (ref <150)
Total CHOL/HDL Ratio: 2.2 Ratio (ref <=5.0)
VLDL: 13 mg/dL (ref <30)

## 2016-01-16 ENCOUNTER — Other Ambulatory Visit: Payer: Self-pay | Admitting: Cardiology

## 2016-11-02 ENCOUNTER — Encounter: Payer: Self-pay | Admitting: Cardiology

## 2016-11-02 ENCOUNTER — Ambulatory Visit (INDEPENDENT_AMBULATORY_CARE_PROVIDER_SITE_OTHER): Payer: Medicare Other | Admitting: Cardiology

## 2016-11-02 VITALS — BP 126/80 | HR 62 | Ht 71.0 in | Wt 185.8 lb

## 2016-11-02 DIAGNOSIS — I10 Essential (primary) hypertension: Secondary | ICD-10-CM | POA: Diagnosis not present

## 2016-11-02 DIAGNOSIS — E7800 Pure hypercholesterolemia, unspecified: Secondary | ICD-10-CM | POA: Diagnosis not present

## 2016-11-02 DIAGNOSIS — I2510 Atherosclerotic heart disease of native coronary artery without angina pectoris: Secondary | ICD-10-CM | POA: Diagnosis not present

## 2016-11-02 DIAGNOSIS — I4510 Unspecified right bundle-branch block: Secondary | ICD-10-CM | POA: Diagnosis not present

## 2016-11-02 LAB — HEPATIC FUNCTION PANEL
ALBUMIN: 4 g/dL (ref 3.6–4.8)
ALT: 23 IU/L (ref 0–44)
AST: 23 IU/L (ref 0–40)
Alkaline Phosphatase: 55 IU/L (ref 39–117)
BILIRUBIN TOTAL: 0.7 mg/dL (ref 0.0–1.2)
Bilirubin, Direct: 0.22 mg/dL (ref 0.00–0.40)
TOTAL PROTEIN: 6.2 g/dL (ref 6.0–8.5)

## 2016-11-02 LAB — LIPID PANEL
CHOL/HDL RATIO: 2.3 ratio (ref 0.0–5.0)
Cholesterol, Total: 111 mg/dL (ref 100–199)
HDL: 49 mg/dL (ref >39)
LDL CALC: 52 mg/dL (ref 0–99)
TRIGLYCERIDES: 48 mg/dL (ref 0–149)
VLDL Cholesterol Cal: 10 mg/dL (ref 5–40)

## 2016-11-02 MED ORDER — CLOPIDOGREL BISULFATE 75 MG PO TABS
75.0000 mg | ORAL_TABLET | Freq: Every day | ORAL | 3 refills | Status: DC
Start: 2016-11-02 — End: 2017-11-13

## 2016-11-02 MED ORDER — ATORVASTATIN CALCIUM 40 MG PO TABS: 40.0000 mg | ORAL_TABLET | Freq: Every day | ORAL | 3 refills | Status: DC

## 2016-11-02 NOTE — Progress Notes (Signed)
Cardiology Office Note:    Date:  11/02/2016   ID:  [name], DOB 1950-03-05, MRN [id number]  PCP:  [name], MD  Cardiologist:  Armanda Magic, MD   Referring MD: [name], MD   Chief Complaint  Patient presents with  . Coronary Artery Disease  . Hypertension  . Hyperlipidemia    History of Present Illness:    Sean [name] is a 66 y.o. male with a hx of with a history of ASCAD s/p BMS to LAD, dyslipidemia and HTN.  He is here today for followup and is doing well.  He denies any chest pain or pressure, SOB, DOE, PND, orthopnea, LE edema, dizziness, palpitations, claudication or syncope.   Past Medical History:  Diagnosis Date  . Alcohol addiction (HCC)    2010- admitted for withdrawal , detox admission after that  . Coronary artery disease    95% mid LAD s/p BMS of LAD with aneurysmal segment  . Depression   . ED (erectile dysfunction)   . HLD (hyperlipidemia)   . HTN (hypertension)   . Mental disorder   . Peptic ulcer   . RBBB 10/24/2015    Past Surgical History:  Procedure Laterality Date  . CORONARY STENT PLACEMENT      Current Medications: Current Meds  Medication Sig  . aspirin EC 81 MG tablet Take 81 mg by mouth as needed.  Marland Kitchen atorvastatin (LIPITOR) 40 MG tablet TAKE 1 TABLET BY MOUTH DAILY.  Marland Kitchen clopidogrel (PLAVIX) 75 MG tablet TAKE 1 TABLET BY MOUTH DAILY  . Multiple Vitamin (MULITIVITAMIN WITH MINERALS) TABS Take 1 tablet by mouth daily.  . vitamin C (ASCORBIC ACID) 500 MG tablet Take 500 mg by mouth daily.     Allergies:   Patient has no known allergies.   Social History   Social History  . Marital status: Married    Spouse name: N/A  . Number of children: N/A  . Years of education: N/A   Social History Main Topics  . Smoking status: Former Games developer  . Smokeless tobacco: Former Neurosurgeon    Quit date: 05/05/1988  . Alcohol use No     Comment: quit 07/2006  . Drug use: No  . Sexual activity: Yes    Birth control/ protection: None   Comment: Married   Other Topics Concern  . None   Social History Narrative   Live with wife in [place], is an Art gallery manager, currently driving trucks for a local firm. Quit smoking 1990 after smoking for 20 years. Drinks heavilyu (1 bottle of vodka or 18-24 beers a day). Used to smoke marijuana but quit in 1990s. Has united healthcare insurance. Goes to [employer] for alcohol cessation     Family History: The patient's family history includes Heart attack (age of onset: 61) in his brother; Prostate cancer in his father. There is no history of Anesthesia problems, Hypotension, Malignant hyperthermia, or Pseudochol deficiency.  ROS:   Please see the history of present illness.     All other systems reviewed and are negative.  EKGs/Labs/Other Studies Reviewed:    The following studies were reviewed today: none  EKG:  EKG is ordered today.  The ekg ordered today demonstrates NSR at 62bpm with RBBB with RAE and septal infarct  Recent Labs: 11/18/2015: ALT 19   Recent Lipid Panel    Component Value Date/Time   CHOL 108 (L) 11/18/2015 0748   TRIG 66 11/18/2015 0748   HDL 49 11/18/2015 0748   CHOLHDL 2.2  11/18/2015 0748   VLDL 13 11/18/2015 0748   LDLCALC 46 11/18/2015 0748    Physical Exam:    VS:  BP 126/80   Pulse 62   Ht  (1.803 m)   Wt 185 lb 12.8 oz (84.3 kg)   BMI 25.91 kg/m     Wt Readings from Last 3 Encounters:  11/02/16 185 lb 12.8 oz (84.3 kg)  11/01/15 181 lb (82.1 kg)  10/24/15 181 lb 9.6 oz (82.4 kg)     GEN:  Well nourished, well developed in no acute distress HEENT: Normal NECK: No JVD; No carotid bruits LYMPHATICS: No lymphadenopathy CARDIAC: RRR, no murmurs, rubs, gallops RESPIRATORY:  Clear to auscultation without rales, wheezing or rhonchi  ABDOMEN: Soft, non-tender, non-distended MUSCULOSKELETAL:  No edema; No deformity  SKIN: Warm and dry NEUROLOGIC:  Alert and oriented x 3 PSYCHIATRIC:  Normal affect   ASSESSMENT:    1. Coronary  artery disease involving native coronary artery of native heart without angina pectoris   2. Essential hypertension   3. Pure hypercholesterolemia   4. RBBB    PLAN:    In order of problems listed above:  1.  ASCAD  s/p BMS to LAD.  He has no anginal symptoms.  He will continue on ASA  daily and Plavix  daily along with statin.   2.  HTN - BP is well controlled on exam today. He is on no antihypertensive drugs at this time.  3.  Hyperlipidemia - his LDL goal is < 70.  He will continue on atorvastatin  daily. I will check an FLP and ALT today as he is fasting.   4.  Chronic RBBB   Medication Adjustments/Labs and Tests Ordered: Current medicines are reviewed at length with the patient today.  Concerns regarding medicines are outlined above.  No orders of the defined types were placed in this encounter.  No orders of the defined types were placed in this encounter.   Signed, Armanda Magic, MD  11/02/2016 9:36 AM    [facility]

## 2016-11-02 NOTE — Patient Instructions (Signed)
Medication Instructions:  Your physician recommends that you continue on your current medications as directed. Please refer to the Current Medication list given to you today.   Labwork: TODAY - liver panel, cholesterol   Testing/Procedures: None Ordered   Follow-Up: Your physician wants you to follow-up in: 1 year with Dr. Mayford Knife.  You will receive a reminder letter in the mail two months in advance. If you don't receive a letter, please call our office to schedule the follow-up appointment.   If you need a refill on your cardiac medications before your next appointment, please call your pharmacy.   Thank you for choosing [facility]! Eligha Bridegroom, RN [phone]

## 2017-09-24 ENCOUNTER — Encounter: Payer: Self-pay | Admitting: Cardiology

## 2017-10-14 NOTE — Progress Notes (Signed)
Cardiology Office Note:    Date:  10/15/2017   ID:  Sean [name], DOB 04-20-1950, MRN [id number]  PCP:  [name], MD  Cardiologist:  No primary care provider on file.    Referring MD: [name], MD   Chief Complaint  Patient presents with  . Coronary Artery Disease  . Hypertension  . Hyperlipidemia    History of Present Illness:    Sean [name] is a 67 y.o. male with a hx of ASCAD s/p BMS to LAD, dyslipidemia and HTN.  He is here today for followup and is doing well.  He denies any chest pain or pressure, SOB, DOE, PND, orthopnea, LE edema, dizziness, palpitations or syncope. He is compliant with his meds and is tolerating meds with no SE.    Past Medical History:  Diagnosis Date  . Alcohol addiction (HCC)    2010- admitted for withdrawal , detox admission after that  . Coronary artery disease    95% mid LAD s/p BMS of LAD with aneurysmal segment  . Depression   . ED (erectile dysfunction)   . HLD (hyperlipidemia)   . HTN (hypertension)   . Mental disorder   . Peptic ulcer   . RBBB 10/24/2015    Past Surgical History:  Procedure Laterality Date  . CORONARY STENT PLACEMENT      Current Medications: Current Meds  Medication Sig  . atorvastatin (LIPITOR) 40 MG tablet Take 1 tablet (40 mg total) by mouth daily.  . clopidogrel (PLAVIX) 75 MG tablet Take 1 tablet (75 mg total) by mouth daily.  . Multiple Vitamin (MULITIVITAMIN WITH MINERALS) TABS Take 1 tablet by mouth daily.  . vitamin C (ASCORBIC ACID) 500 MG tablet Take 500 mg by mouth daily.     Allergies:   Patient has no known allergies.   Social History   Socioeconomic History  . Marital status: Married    Spouse name: Not on file  . Number of children: Not on file  . Years of education: Not on file  . Highest education level: Not on file  Occupational History  . Not on file  Social Needs  . Financial resource strain: Not on file  . Food insecurity:    Worry: Not on file    Inability: Not on  file  . Transportation needs:    Medical: Not on file    Non-medical: Not on file  Tobacco Use  . Smoking status: Former Smoker    Last attempt to quit: 1990    Years since quitting: 29.6  . Smokeless tobacco: Never Used  Substance and Sexual Activity  . Alcohol use: No    Comment: quit 07/2006  . Drug use: No  . Sexual activity: Yes    Birth control/protection: None    Comment: Married  Lifestyle  . Physical activity:    Days per week: Not on file    Minutes per session: Not on file  . Stress: Not on file  Relationships  . Social connections:    Talks on phone: Not on file    Gets together: Not on file    Attends religious service: Not on file    Active member of club or organization: Not on file    Attends meetings of clubs or organizations: Not on file    Relationship status: Not on file  Other Topics Concern  . Not on file  Social History Narrative   Live with wife in [place], is an Art gallery manager, currently driving trucks  for a local firm. Quit smoking 1990 after smoking for 20 years. Drinks heavilyu (1 bottle of vodka or 18-24 beers a day). Used to smoke marijuana but quit in 1990s. Has united healthcare insurance. Goes to [employer] meetings for alcohol cessation     Family History: The patient's family history includes Heart attack (age of onset: 6567) in his brother; Prostate cancer in his father. There is no history of Anesthesia problems, Hypotension, Malignant hyperthermia, or Pseudochol deficiency.  ROS:   Please see the history of present illness.    ROS  All other systems reviewed and negative.   EKGs/Labs/Other Studies Reviewed:    The following studies were reviewed today: none  EKG:  EKG is  ordered today.  The ekg ordered today demonstrates NSR at 62bpm with RBBB  Recent Labs: 11/02/2016: ALT 23   Recent Lipid Panel    Component Value Date/Time   CHOL 111 11/02/2016 1017   TRIG 48 11/02/2016 1017   HDL 49 11/02/2016 1017   CHOLHDL 2.3 11/02/2016 1017    CHOLHDL 2.2 11/18/2015 0748   VLDL 13 11/18/2015 0748   LDLCALC 52 11/02/2016 1017    Physical Exam:    VS:  Pulse 62   Ht 5\' 11"  (1.803 m)   Wt 187 lb 12.8 oz (85.2 kg)   SpO2 97%   BMI 26.19 kg/m     Wt Readings from Last 3 Encounters:  10/15/17 187 lb 12.8 oz (85.2 kg)  11/02/16 185 lb 12.8 oz (84.3 kg)  11/01/15 181 lb (82.1 kg)     GEN:  Well nourished, well developed in no acute distress HEENT: Normal NECK: No JVD; No carotid bruits LYMPHATICS: No lymphadenopathy CARDIAC: RRR, no murmurs, rubs, gallops RESPIRATORY:  Clear to auscultation without rales, wheezing or rhonchi  ABDOMEN: Soft, non-tender, non-distended MUSCULOSKELETAL:  No edema; No deformity  SKIN: Warm and dry NEUROLOGIC:  Alert and oriented x 3 PSYCHIATRIC:  Normal affect   ASSESSMENT:    1. Coronary artery disease involving native coronary artery of native heart without angina pectoris   2. Essential hypertension   3. Pure hypercholesterolemia    PLAN:    In order of problems listed above:  1.  ASCAD - s/p BMS to the LAD.  He denies any anginal sx.  He will continue on ASA 81mg  daily, Plavix 75mg  daily ad statin.  2.  HTN - BP is well controlled on exam today.  He is not on any antihypertensive meds   3.  Hyperlipidemia with LDL goal < 70.  LDL was 52 on 11/02/2016.  He will continue on atorvastatin 40mg  daily.  I will repeat FLP and ALT.   Medication Adjustments/Labs and Tests Ordered: Current medicines are reviewed at length with the patient today.  Concerns regarding medicines are outlined above.  No orders of the defined types were placed in this encounter.  No orders of the defined types were placed in this encounter.   Signed, Armanda Magicraci Valori Hollenkamp, MD  10/15/2017 8:37 AM     [facility]

## 2017-10-15 ENCOUNTER — Ambulatory Visit: Payer: PPO | Admitting: Cardiology

## 2017-10-15 ENCOUNTER — Encounter: Payer: Self-pay | Admitting: Cardiology

## 2017-10-15 VITALS — BP 132/78 | HR 62 | Ht 71.0 in | Wt 187.8 lb

## 2017-10-15 DIAGNOSIS — I2510 Atherosclerotic heart disease of native coronary artery without angina pectoris: Secondary | ICD-10-CM

## 2017-10-15 DIAGNOSIS — I10 Essential (primary) hypertension: Secondary | ICD-10-CM

## 2017-10-15 DIAGNOSIS — E7800 Pure hypercholesterolemia, unspecified: Secondary | ICD-10-CM

## 2017-10-15 NOTE — Patient Instructions (Signed)
Your physician recommends that you continue on your current medications as directed. Please refer to the Current Medication list given to you today. Your physician recommends that you return for lab work in:  1 WEEK FASTING LIPID AND LIVER  Your physician wants you to follow-up ZO:XWRUin:YEAR WITH DR Mayford KnifeURNER You will receive a reminder letter in the mail two months in advance. If you don't receive a letter, please call our office to schedule the follow-up appointment.

## 2017-10-19 ENCOUNTER — Other Ambulatory Visit: Payer: PPO | Admitting: *Deleted

## 2017-10-19 DIAGNOSIS — I2510 Atherosclerotic heart disease of native coronary artery without angina pectoris: Secondary | ICD-10-CM

## 2017-10-19 DIAGNOSIS — E7800 Pure hypercholesterolemia, unspecified: Secondary | ICD-10-CM | POA: Diagnosis not present

## 2017-10-20 LAB — HEPATIC FUNCTION PANEL
ALBUMIN: 4.2 g/dL (ref 3.6–4.8)
ALK PHOS: 67 IU/L (ref 39–117)
ALT: 21 IU/L (ref 0–44)
AST: 26 IU/L (ref 0–40)
Bilirubin Total: 0.5 mg/dL (ref 0.0–1.2)
Bilirubin, Direct: 0.16 mg/dL (ref 0.00–0.40)
Total Protein: 6.3 g/dL (ref 6.0–8.5)

## 2017-10-20 LAB — LIPID PANEL
CHOLESTEROL TOTAL: 120 mg/dL (ref 100–199)
Chol/HDL Ratio: 2.6 ratio (ref 0.0–5.0)
HDL: 47 mg/dL (ref >39)
LDL CALC: 60 mg/dL (ref 0–99)
Triglycerides: 63 mg/dL (ref 0–149)
VLDL CHOLESTEROL CAL: 13 mg/dL (ref 5–40)

## 2017-10-21 ENCOUNTER — Other Ambulatory Visit: Payer: Self-pay

## 2017-11-13 ENCOUNTER — Other Ambulatory Visit: Payer: Self-pay | Admitting: Cardiology

## 2018-03-11 DIAGNOSIS — Z125 Encounter for screening for malignant neoplasm of prostate: Secondary | ICD-10-CM | POA: Diagnosis not present

## 2018-03-11 DIAGNOSIS — R350 Frequency of micturition: Secondary | ICD-10-CM | POA: Diagnosis not present

## 2018-03-11 DIAGNOSIS — Z0000 Encounter for general adult medical examination without abnormal findings: Secondary | ICD-10-CM | POA: Diagnosis not present

## 2018-03-11 DIAGNOSIS — E785 Hyperlipidemia, unspecified: Secondary | ICD-10-CM | POA: Diagnosis not present

## 2018-03-11 DIAGNOSIS — Z1211 Encounter for screening for malignant neoplasm of colon: Secondary | ICD-10-CM | POA: Diagnosis not present

## 2018-03-11 DIAGNOSIS — Z23 Encounter for immunization: Secondary | ICD-10-CM | POA: Diagnosis not present

## 2018-03-11 DIAGNOSIS — R030 Elevated blood-pressure reading, without diagnosis of hypertension: Secondary | ICD-10-CM | POA: Diagnosis not present

## 2018-04-27 DIAGNOSIS — R350 Frequency of micturition: Secondary | ICD-10-CM | POA: Diagnosis not present

## 2018-04-27 DIAGNOSIS — N401 Enlarged prostate with lower urinary tract symptoms: Secondary | ICD-10-CM | POA: Diagnosis not present

## 2018-04-27 DIAGNOSIS — R3912 Poor urinary stream: Secondary | ICD-10-CM | POA: Diagnosis not present

## 2018-04-27 DIAGNOSIS — R972 Elevated prostate specific antigen [PSA]: Secondary | ICD-10-CM | POA: Diagnosis not present

## 2018-08-24 ENCOUNTER — Other Ambulatory Visit: Payer: Self-pay | Admitting: Cardiology

## 2018-10-21 ENCOUNTER — Telehealth: Payer: Self-pay

## 2018-10-21 NOTE — Telephone Encounter (Signed)
Called and left a detailed message informing of the OV to virtual visit appt and to call back if he would like to reschedule.

## 2018-10-25 NOTE — Progress Notes (Signed)
Virtual Visit via Video Note   This visit type was conducted due to national recommendations for restrictions regarding the COVID-19 Pandemic (e.g. social distancing) in an effort to limit this patient's exposure and mitigate transmission in our community.  Due to his co-morbid illnesses, this patient is at least at moderate risk for complications without adequate follow up.  This format is felt to be most appropriate for this patient at this time.  All issues noted in this document were discussed and addressed.  A limited physical exam was performed with this format.  Please refer to the patient's chart for his consent to telehealth [facility].   Evaluation Performed:  Follow-up visit  This visit type was conducted due to national recommendations for restrictions regarding the COVID-19 Pandemic (e.g. social distancing).  This format is felt to be most appropriate for this patient at this time.  All issues noted in this document were discussed and addressed.  No physical exam was performed (except for noted visual exam findings with Video Visits).  Please refer to the patient's chart (MyChart message for video visits and phone note for telephone visits) for the patient's consent [facility].  Date:  10/26/2018   ID:  Sean [name], DOB 07/25/1950, MRN [id number]  Patient Location:  Home  Provider location:   [place]  PCP:  Farris HasMorrow, Aaron, MD  Cardiologist:   Electrophysiologist:  None   Chief Complaint:  CAD, HTN, HLD  History of Present Illness:    Sean [name] is a 68 y.o. male who presents via audio/video conferencing for a telehealth visit today.    Sean [name] is a 68 y.o. male with a hx of ASCAD s/p BMS to LAD, dyslipidemia and HTN. He is here today for followup and is doing well.  He denies any chest pain or pressure, SOB, DOE, PND, orthopnea, LE edema, dizziness, palpitations or syncope. He is compliant with his meds and is tolerating meds with no  SE.    The patient does not have symptoms concerning for COVID-19 infection (fever, chills, cough, or new shortness of breath).    Prior CV studies:   The following studies were reviewed today:  none  Past Medical History:  Diagnosis Date  . Alcohol addiction (HCC)    2010- admitted for withdrawal , detox admission after that  . Coronary artery disease    95% mid LAD s/p BMS of LAD with aneurysmal segment  . Depression   . ED (erectile dysfunction)   . HLD (hyperlipidemia)   . HTN (hypertension)   . Mental disorder   . Peptic ulcer   . RBBB 10/24/2015   Past Surgical History:  Procedure Laterality Date  . CORONARY STENT PLACEMENT       Current Meds  Medication Sig  . atorvastatin (LIPITOR) 40 MG tablet TAKE ONE TABLET BY MOUTH ONE TIME DAILY   . clopidogrel (PLAVIX) 75 MG tablet TAKE ONE TABLET BY MOUTH ONE TIME DAILY   . Multiple Vitamin (MULITIVITAMIN WITH MINERALS) TABS Take 1 tablet by mouth daily.  . vitamin C (ASCORBIC ACID) 500 MG tablet Take 500 mg by mouth daily.     Allergies:   Patient has no known allergies.   Social History   Tobacco Use  . Smoking status: Former Smoker    Quit date: 1990    Years since quitting: 30.7  . Smokeless tobacco: Never Used  Substance Use Topics  . Alcohol use: No    Comment:  quit 07/2006  . Drug use: No     Family Hx: The patient's family history includes Heart attack (age of onset: 72) in his brother; Prostate cancer in his father. There is no history of Anesthesia problems, Hypotension, Malignant hyperthermia, or Pseudochol deficiency.  ROS:   Please see the history of present illness.     All other systems reviewed and are negative.   Labs/Other Tests and Data Reviewed:    Recent Labs: No results found for requested labs within last 8760 hours.   Recent Lipid Panel Lab Results  Component Value Date/Time   CHOL 120 10/19/2017 02:19 PM   TRIG 63 10/19/2017 02:19 PM   HDL 47 10/19/2017 02:19 PM   CHOLHDL 2.6  10/19/2017 02:19 PM   CHOLHDL 2.2 11/18/2015 07:48 AM   LDLCALC 60 10/19/2017 02:19 PM    Wt Readings from Last 3 Encounters:  10/26/18 210 lb (95.3 kg)  10/15/17 187 lb 12.8 oz (85.2 kg)  11/02/16 185 lb 12.8 oz (84.3 kg)     Objective:    Vital Signs:  BP (!) 153/87   Pulse 89   Ht 5\' 10"  (1.778 m)   Wt 210 lb (95.3 kg)   BMI 30.13 kg/m    CONSTITUTIONAL:  Well nourished, well developed male in no acute distress.  EYES: anicteric MOUTH: oral mucosa is pink RESPIRATORY: Normal respiratory effort, symmetric expansion CARDIOVASCULAR: No peripheral edema SKIN: No rash, lesions or ulcers MUSCULOSKELETAL: no digital cyanosis NEURO: Cranial Nerves II-XII grossly intact, moves all extremities PSYCH: Intact judgement and insight.  A&O x 3, Mood/affect appropriate   ASSESSMENT & PLAN:    1.  ASACD  -s/p BMS to the LAD -no anginal sx -continue on Plavix, statin -not on ASA due to increased bleeding.   2.  HTN  -BP elevated at 153/78mmHg. He is salting his food and I encouraged him to cut back on Na in his diet. I have asked him to check his BP daily for a week and call with the results.   3.  Hyperlipidemia -LDL goal < 70 -repeat FLP and ALT -continue on atorvastatin 40mg  daily  COVID-19 Education: The signs and symptoms of COVID-19 were discussed with the patient and how to seek care for testing (follow up with PCP or arrange E-visit).  The importance of social distancing was discussed today.  Patient Risk:   After full review of this patient's clinical status, I feel that they are at least moderate risk at this time.  Time:   Today, I have spent 20 minutes directly with the patient on telemedicine discussing medical problems including CAD, HTN, HLD.  We also reviewed the symptoms of COVID 19 and the ways to protect against contracting the virus with telehealth technology.  I spent an additional 5 minutes reviewing patient's chart including labs.  Medication  Adjustments/Labs and Tests Ordered: Current medicines are reviewed at length with the patient today.  Concerns regarding medicines are outlined above.  Tests Ordered: No orders of the defined types were placed in this encounter.  Medication Changes: No orders of the defined types were placed in this encounter.   Disposition:  Follow up in 1 year(s)  Signed, Fransico Him, MD  10/26/2018 9:02 AM    [place]

## 2018-10-26 ENCOUNTER — Other Ambulatory Visit: Payer: Self-pay

## 2018-10-26 ENCOUNTER — Telehealth (INDEPENDENT_AMBULATORY_CARE_PROVIDER_SITE_OTHER): Payer: PPO | Admitting: Cardiology

## 2018-10-26 VITALS — BP 153/87 | HR 89 | Ht 70.0 in | Wt 210.0 lb

## 2018-10-26 DIAGNOSIS — I10 Essential (primary) hypertension: Secondary | ICD-10-CM | POA: Diagnosis not present

## 2018-10-26 DIAGNOSIS — E7800 Pure hypercholesterolemia, unspecified: Secondary | ICD-10-CM

## 2018-10-26 DIAGNOSIS — I2510 Atherosclerotic heart disease of native coronary artery without angina pectoris: Secondary | ICD-10-CM

## 2018-10-26 NOTE — Telephone Encounter (Signed)
Virtual Visit Pre-Appointment Phone Call  "(Name), I am calling you today to discuss your upcoming appointment. We are currently trying to limit exposure to the virus that causes COVID-19 by seeing patients at home rather than in the office."  1. "What is the BEST phone number to call the day of the visit?" - include this in appointment notes  2. Do you have or have access to (through a family member/friend) a smartphone with video capability that we can use for your visit?" a. If yes - list this number in appt notes as cell (if different from BEST phone #) and list the appointment type as a VIDEO visit in appointment notes b. If no - list the appointment type as a PHONE visit in appointment notes  3. Confirm consent - "In the setting of the current Covid19 crisis, you are scheduled for a (phone or video) visit with your provider on (date) at (time).  Just as we do with many in-office visits, in order for you to participate in this visit, we must obtain consent.  If you'd like, I can send this to your mychart (if signed up) or email for you to review.  Otherwise, I can obtain your verbal consent now.  All virtual visits are billed to your insurance company just like a normal visit would be.  By agreeing to a virtual visit, we'd like you to understand that the technology does not allow for your provider to perform an examination, and thus may limit your provider's ability to fully assess your condition. If your provider identifies any concerns that need to be evaluated in person, we will make arrangements to do so.  Finally, though the technology is pretty good, we cannot assure that it will always work on either your or our end, and in the setting of a video visit, we may have to convert it to a phone-only visit.  In either situation, we cannot ensure that we have a secure connection.  Are you willing to proceed?" STAFF: Did the patient verbally acknowledge consent to telehealth visit? Document  YES/NO here: YES  4. Advise patient to be prepared - "Two hours prior to your appointment, go ahead and check your blood pressure, pulse, oxygen saturation, and your weight (if you have the equipment to check those) and write them all down. When your visit starts, your provider will ask you for this information. If you have an Apple Watch or Kardia device, please plan to have heart rate information ready on the day of your appointment. Please have a pen and paper handy nearby the day of the visit as well."  5. Give patient instructions for MyChart download to smartphone OR Doximity/Doxy.me as below if video visit (depending on what platform provider is using)  6. Inform patient they will receive a phone call 15 minutes prior to their appointment time (may be from unknown caller ID) so they should be prepared to answer     INSTRUCTIONS FOR DOWNLOADING THE [facility] APP TO SMARTPHONE  - The patient must first make sure to have activated MyChart and know their login information - If Apple, go to [employer] Store and type in MyChart in the search bar and download the app. If Android, ask patient to go to [employer] Play Store and type in [facility] in the search bar and download the app. The app is free but as with any other app downloads, their phone may require them to verify saved payment information or Apple/Android password.  -  The patient will need to then log into the app with their MyChart username and password, and select [place] as their healthcare provider to link the account. When it is time for your visit, go to the MyChart app, find appointments, and click Begin Video Visit. Be sure to Select Allow for your device to access the Microphone and Camera for your visit. You will then be connected, and your provider will be with you shortly.  **If they have any issues connecting, or need assistance please contact MyChart service desk [phone])**  **If using a computer, in order to ensure  the best quality for their visit they will need to use either of the following Internet Browsers: [employer] or Google Chrome**  IF USING DOXIMITY or DOXY.ME - The patient will receive a link just prior to their visit by text.     FULL LENGTH CONSENT FOR TELE-HEALTH VISIT   I hereby voluntarily request, consent and authorize [facility] and its employed or contracted physicians, physician assistants, nurse practitioners or other licensed health care professionals (the Practitioner), to provide me with telemedicine health care services (the Services") as deemed necessary by the treating Practitioner. I acknowledge and consent to receive the Services by the Practitioner via telemedicine. I understand that the telemedicine visit will involve communicating with the Practitioner through live audiovisual communication technology and the disclosure of certain medical information by electronic transmission. I acknowledge that I have been given the opportunity to request an in-person assessment or other available alternative prior to the telemedicine visit and am voluntarily participating in the telemedicine visit.  I understand that I have the right to withhold or withdraw my consent to the use of telemedicine in the course of my care at any time, without affecting my right to future care or treatment, and that the Practitioner or I may terminate the telemedicine visit at any time. I understand that I have the right to inspect all information obtained and/or recorded in the course of the telemedicine visit and may receive copies of available information for a reasonable fee.  I understand that some of the potential risks of receiving the Services via telemedicine include:   Delay or interruption in medical evaluation due to technological equipment failure or disruption;  Information transmitted may not be sufficient (e.g. poor resolution of images) to allow for appropriate medical decision making by  the Practitioner; and/or   In rare instances, security protocols could fail, causing a breach of personal health information.  Furthermore, I acknowledge that it is my responsibility to provide information about my medical history, conditions and care that is complete and accurate to the best of my ability. I acknowledge that Practitioner's advice, recommendations, and/or decision may be based on factors not within their control, such as incomplete or inaccurate data provided by me or distortions of diagnostic images or specimens that may result from electronic transmissions. I understand that the practice of medicine is not an exact science and that Practitioner makes no warranties or guarantees regarding treatment outcomes. I acknowledge that I will receive a copy of this consent concurrently upon execution via email to the email address I last provided but may also request a printed copy by calling the office of [facility].    I understand that my insurance will be billed for this visit.   I have read or had this consent read to me.  I understand the contents of this consent, which adequately explains the benefits and risks of the Services  being provided via telemedicine.   I have been provided ample opportunity to ask questions regarding this consent and the Services and have had my questions answered to my satisfaction.  I give my informed consent for the services to be provided through the use of telemedicine in my medical care  By participating in this telemedicine visit I agree to the above.

## 2018-10-26 NOTE — Patient Instructions (Signed)
Medication Instructions:  Your provider recommends that you continue on your current medications as directed. Please refer to the Current Medication list given to you today.    Labwork: Your provider recommends that you return for FASTING lab work.  Follow-Up: Your provider wants you to follow-up in: 1 year with Dr. Radford Pax. You will receive a reminder letter in the mail two months in advance. If you don't receive a letter, please call our office to schedule the follow-up appointment.

## 2018-10-27 ENCOUNTER — Other Ambulatory Visit: Payer: PPO | Admitting: *Deleted

## 2018-10-27 ENCOUNTER — Other Ambulatory Visit: Payer: Self-pay

## 2018-10-27 DIAGNOSIS — E7800 Pure hypercholesterolemia, unspecified: Secondary | ICD-10-CM | POA: Diagnosis not present

## 2018-10-27 DIAGNOSIS — I2510 Atherosclerotic heart disease of native coronary artery without angina pectoris: Secondary | ICD-10-CM

## 2018-10-27 LAB — HEPATIC FUNCTION PANEL
ALT: 26 IU/L (ref 0–44)
AST: 24 IU/L (ref 0–40)
Albumin: 4.1 g/dL (ref 3.8–4.8)
Alkaline Phosphatase: 67 IU/L (ref 39–117)
Bilirubin Total: 0.5 mg/dL (ref 0.0–1.2)
Bilirubin, Direct: 0.2 mg/dL (ref 0.00–0.40)
Total Protein: 6.3 g/dL (ref 6.0–8.5)

## 2018-10-27 LAB — LIPID PANEL
Chol/HDL Ratio: 2.6 ratio (ref 0.0–5.0)
Cholesterol, Total: 144 mg/dL (ref 100–199)
HDL: 55 mg/dL (ref >39)
LDL Chol Calc (NIH): 79 mg/dL (ref 0–99)
Triglycerides: 44 mg/dL (ref 0–149)
VLDL Cholesterol Cal: 10 mg/dL (ref 5–40)

## 2018-10-28 ENCOUNTER — Telehealth: Payer: Self-pay

## 2018-10-28 DIAGNOSIS — E7800 Pure hypercholesterolemia, unspecified: Secondary | ICD-10-CM

## 2018-10-28 DIAGNOSIS — Z79899 Other long term (current) drug therapy: Secondary | ICD-10-CM

## 2018-10-28 DIAGNOSIS — I2510 Atherosclerotic heart disease of native coronary artery without angina pectoris: Secondary | ICD-10-CM

## 2018-10-28 DIAGNOSIS — I10 Essential (primary) hypertension: Secondary | ICD-10-CM

## 2018-10-28 NOTE — Telephone Encounter (Signed)
-----   Message from Sueanne Margarita, MD sent at 10/28/2018 10:11 AM EDT ----- LDL not at goal - increase Atorvastatin to 80mg  daily and repeat FLP and ALT in 6 weeks

## 2018-10-28 NOTE — Telephone Encounter (Signed)
Follow up ° ° ° ° ° °Returning a call to the nurse °

## 2018-10-28 NOTE — Telephone Encounter (Signed)
LMTCB

## 2018-10-31 MED ORDER — ATORVASTATIN CALCIUM 80 MG PO TABS: 80.0000 mg | ORAL_TABLET | Freq: Every day | ORAL | 3 refills | Status: DC

## 2018-10-31 NOTE — Telephone Encounter (Signed)
Spoke with the pt  And he verbalized understanding and agreed to increase his Lipitor to 80 mg with fasting labs 12/19/18.   Pt says his BP is averaging 135/80 but tends to be high in the morning after breakfast and walking the dog... he had a lot of sausage and beer over the weekend.. I advised him that this kind of diet is not only very bad for his BP but his Lipids and he needs to be very careful with his nutritional intake. PT agreed.   Pt to check his BP twice a day after resting for about 20 min with both feet on the ground and will send his readings through to Dr. Radford Pax on My Chart for her review before the end of the week.

## 2018-11-03 DIAGNOSIS — Z23 Encounter for immunization: Secondary | ICD-10-CM | POA: Diagnosis not present

## 2018-11-13 ENCOUNTER — Other Ambulatory Visit: Payer: Self-pay | Admitting: Cardiology

## 2018-12-19 ENCOUNTER — Other Ambulatory Visit: Payer: PPO | Admitting: *Deleted

## 2018-12-19 ENCOUNTER — Other Ambulatory Visit: Payer: Self-pay

## 2018-12-19 DIAGNOSIS — Z79899 Other long term (current) drug therapy: Secondary | ICD-10-CM | POA: Diagnosis not present

## 2018-12-19 DIAGNOSIS — E7800 Pure hypercholesterolemia, unspecified: Secondary | ICD-10-CM | POA: Diagnosis not present

## 2018-12-19 DIAGNOSIS — I10 Essential (primary) hypertension: Secondary | ICD-10-CM | POA: Diagnosis not present

## 2018-12-19 DIAGNOSIS — I2510 Atherosclerotic heart disease of native coronary artery without angina pectoris: Secondary | ICD-10-CM | POA: Diagnosis not present

## 2018-12-19 LAB — HEPATIC FUNCTION PANEL
ALT: 61 IU/L — ABNORMAL HIGH (ref 0–44)
AST: 67 IU/L — ABNORMAL HIGH (ref 0–40)
Albumin: 4.6 g/dL (ref 3.8–4.8)
Alkaline Phosphatase: 87 IU/L (ref 39–117)
Bilirubin Total: 1 mg/dL (ref 0.0–1.2)
Bilirubin, Direct: 0.36 mg/dL (ref 0.00–0.40)
Total Protein: 7.3 g/dL (ref 6.0–8.5)

## 2018-12-19 LAB — LIPID PANEL
Chol/HDL Ratio: 2 ratio (ref 0.0–5.0)
Cholesterol, Total: 185 mg/dL (ref 100–199)
HDL: 93 mg/dL (ref >39)
LDL Chol Calc (NIH): 79 mg/dL (ref 0–99)
Triglycerides: 69 mg/dL (ref 0–149)
VLDL Cholesterol Cal: 13 mg/dL (ref 5–40)

## 2018-12-20 ENCOUNTER — Telehealth: Payer: Self-pay | Admitting: Cardiology

## 2018-12-20 ENCOUNTER — Telehealth: Payer: Self-pay

## 2018-12-20 DIAGNOSIS — E7800 Pure hypercholesterolemia, unspecified: Secondary | ICD-10-CM

## 2018-12-20 DIAGNOSIS — Z79899 Other long term (current) drug therapy: Secondary | ICD-10-CM

## 2018-12-20 MED ORDER — EZETIMIBE 10 MG PO TABS: 10.0000 mg | ORAL_TABLET | Freq: Every day | ORAL | 3 refills | Status: DC

## 2018-12-20 NOTE — Telephone Encounter (Signed)
-----   Message from Ramond Dial, [facility] sent at 12/20/2018  7:56 AM EST ----- I would recommend adding Zetia 10mg  daily. Recheck lipids and lft in 1-2 months due to slight increase is AST and ALT

## 2018-12-20 NOTE — Telephone Encounter (Signed)
Notes recorded by Frederik Schmidt, RN on 12/20/2018 at 8:02 AM EST  lpmtcb 11/3  ------

## 2018-12-20 NOTE — Telephone Encounter (Signed)
Patient is returning call in regards to his lab results

## 2018-12-20 NOTE — Telephone Encounter (Signed)
The patient has been notified of the result and verbalized understanding.  All questions (if any) were answered. Frederik Schmidt, RN 12/20/2018 10:02 AM

## 2019-04-01 ENCOUNTER — Ambulatory Visit: Payer: PPO | Attending: Internal Medicine

## 2019-04-01 DIAGNOSIS — Z23 Encounter for immunization: Secondary | ICD-10-CM

## 2019-04-01 NOTE — Progress Notes (Signed)
   Covid-19 Vaccination Clinic  Name:  Sean [name]    MRN: [id number] DOB: [birth year]  04/01/2019  Mr. Sean [name] was observed post Covid-19 immunization for 15 minutes without incidence. He was provided with Vaccine Information Sheet and instruction to access the V-Safe system.   Mr. Sean [name] was instructed to call 911 with any severe reactions post vaccine: Marland Kitchen Difficulty breathing  . Swelling of your face and throat  . A fast heartbeat  . A bad rash all over your body  . Dizziness and weakness    Immunizations Administered    Name Date Dose VIS Date Route   Pfizer COVID-19 Vaccine 04/01/2019  8:51 AM 0.3 mL 01/27/2019 Intramuscular   Manufacturer: [employer], [employer]   Lot: [id number]   NDC: [address]-6

## 2019-07-11 DIAGNOSIS — Z125 Encounter for screening for malignant neoplasm of prostate: Secondary | ICD-10-CM | POA: Diagnosis not present

## 2019-07-11 DIAGNOSIS — I10 Essential (primary) hypertension: Secondary | ICD-10-CM | POA: Diagnosis not present

## 2019-07-11 DIAGNOSIS — R350 Frequency of micturition: Secondary | ICD-10-CM | POA: Diagnosis not present

## 2019-07-11 DIAGNOSIS — Z0000 Encounter for general adult medical examination without abnormal findings: Secondary | ICD-10-CM | POA: Diagnosis not present

## 2019-07-11 DIAGNOSIS — E785 Hyperlipidemia, unspecified: Secondary | ICD-10-CM | POA: Diagnosis not present

## 2019-07-25 DIAGNOSIS — I10 Essential (primary) hypertension: Secondary | ICD-10-CM | POA: Diagnosis not present

## 2019-08-12 ENCOUNTER — Other Ambulatory Visit: Payer: Self-pay | Admitting: Cardiology

## 2019-08-22 DIAGNOSIS — I10 Essential (primary) hypertension: Secondary | ICD-10-CM | POA: Diagnosis not present

## 2019-11-02 DIAGNOSIS — Z23 Encounter for immunization: Secondary | ICD-10-CM | POA: Diagnosis not present

## 2019-11-13 ENCOUNTER — Other Ambulatory Visit: Payer: Self-pay | Admitting: Cardiology

## 2019-12-23 ENCOUNTER — Other Ambulatory Visit: Payer: Self-pay | Admitting: Cardiology

## 2020-01-03 ENCOUNTER — Encounter: Payer: Self-pay | Admitting: Cardiology

## 2020-01-03 ENCOUNTER — Ambulatory Visit: Payer: PPO | Admitting: Cardiology

## 2020-01-03 ENCOUNTER — Other Ambulatory Visit: Payer: Self-pay

## 2020-01-03 VITALS — BP 150/82 | HR 90 | Ht 70.0 in | Wt 216.8 lb

## 2020-01-03 DIAGNOSIS — E7800 Pure hypercholesterolemia, unspecified: Secondary | ICD-10-CM | POA: Diagnosis not present

## 2020-01-03 DIAGNOSIS — I10 Essential (primary) hypertension: Secondary | ICD-10-CM | POA: Diagnosis not present

## 2020-01-03 DIAGNOSIS — I2510 Atherosclerotic heart disease of native coronary artery without angina pectoris: Secondary | ICD-10-CM | POA: Diagnosis not present

## 2020-01-03 NOTE — Progress Notes (Signed)
Date:  01/03/2020   ID:  Sean [name], DOB [birth year], MRN [id number]  PCP:  [name], MD  Cardiologist:   Electrophysiologist:  None   Chief Complaint:  CAD, HTN, HLD  History of Present Illness:    Sean [name] is a 69 y.o. male with a hx of ASCAD s/p BMS to LAD, dyslipidemia and HTN. He is here today for followup and is doing well.  He is here today for followup and is doing well.  He denies any chest pain or pressure, SOB, DOE, PND, orthopnea, LE edema, palpitations or syncope. Occasionally he will feel dizzy if he gets out of bed too quickly.  He is compliant with his meds and is tolerating meds with no SE.    Prior CV studies:   The following studies were reviewed today:  EKG  Past Medical History:  Diagnosis Date  . Alcohol addiction (HCC)    2010- admitted for withdrawal , detox admission after that  . Coronary artery disease    95% mid LAD s/p BMS of LAD with aneurysmal segment  . Depression   . ED (erectile dysfunction)   . HLD (hyperlipidemia)   . HTN (hypertension)   . Mental disorder   . Peptic ulcer   . RBBB 10/24/2015   Past Surgical History:  Procedure Laterality Date  . CORONARY STENT PLACEMENT       Current Meds  Medication Sig  . atorvastatin (LIPITOR) 80 MG tablet TAKE ONE TABLET BY MOUTH ONE TIME DAILY  . clopidogrel (PLAVIX) 75 MG tablet TAKE ONE TABLET BY MOUTH ONE TIME DAILY office visit needed with dr Demarie Uhlig for further refills*  . ezetimibe (ZETIA) 10 MG tablet Take 1 tablet (10 mg total) by mouth daily. Please make overdue appt with Dr. Mayford Knife before anymore refills. Thank you 1st attempt  . Multiple Vitamin (MULITIVITAMIN WITH MINERALS) TABS Take 1 tablet by mouth daily.  . vitamin C (ASCORBIC ACID) 500 MG tablet Take 500 mg by mouth daily.     Allergies:   Patient has no known allergies.   Social History   Tobacco Use  . Smoking status: Former Smoker    Quit date: 1990    Years since quitting: 31.8  . Smokeless tobacco: Never  Used  Vaping Use  . Vaping Use: Never used  Substance Use Topics  . Alcohol use: No    Comment: quit 07/2006  . Drug use: No     Family Hx: The patient's family history includes Heart attack (age of onset: 2) in his brother; Prostate cancer in his father. There is no history of Anesthesia problems, Hypotension, Malignant hyperthermia, or Pseudochol deficiency.  ROS:   Please see the history of present illness.     All other systems reviewed and are negative.   Labs/Other Tests and Data Reviewed:    Recent Labs: No results found for requested labs within last 8760 hours.   Recent Lipid Panel Lab Results  Component Value Date/Time   CHOL 185 12/19/2018 09:05 AM   TRIG 69 12/19/2018 09:05 AM   HDL 93 12/19/2018 09:05 AM   CHOLHDL 2.0 12/19/2018 09:05 AM   CHOLHDL 2.2 11/18/2015 07:48 AM   LDLCALC 79 12/19/2018 09:05 AM    Wt Readings from Last 3 Encounters:  01/03/20 216 lb 12.8 oz (98.3 kg)  10/26/18 210 lb (95.3 kg)  10/15/17 187 lb 12.8 oz (85.2 kg)     Objective:    Vital Signs:  BP (!) 150/82  Pulse 90   Ht 5\' 10"  (1.778 m)   Wt 216 lb 12.8 oz (98.3 kg)   SpO2 97%   BMI 31.11 kg/m    GEN: Well nourished, well developed in no acute distress HEENT: Normal NECK: No JVD; No carotid bruits LYMPHATICS: No lymphadenopathy CARDIAC:RRR, no murmurs, rubs, gallops RESPIRATORY:  Clear to auscultation without rales, wheezing or rhonchi  ABDOMEN: Soft, non-tender, non-distended MUSCULOSKELETAL:  No edema; No deformity  SKIN: Warm and dry NEUROLOGIC:  Alert and oriented x 3 PSYCHIATRIC:  Normal affect   EKG was done today and showed NSR with RBBB.    ASSESSMENT & PLAN:    1.  ASACD  -s/p BMS to the LAD -he denies any anginal symptoms -continue on Plavix, statin -not on ASA due to increased bleeding.   2.  HTN  -BP is borderline controlled on exam today but he checks it at home and runs 130-140/59mmhg -currently not on antihypertensive meds  3.   Hyperlipidemia -LDL goal < 70 -LDL 47 in May 2021 -continue on atorvastatin 80mg  daily and Zetia 10mg  daily   Medication Adjustments/Labs and Tests Ordered: Current medicines are reviewed at length with the patient today.  Concerns regarding medicines are outlined above.  Tests Ordered: Orders Placed This Encounter  Procedures  . EKG 12-Lead   Medication Changes: No orders of the defined types were placed in this encounter.   Disposition:  Follow up in 1 year(s)  Signed, June 2021, MD  01/03/2020 2:57 PM    [facility]

## 2020-01-03 NOTE — Patient Instructions (Signed)

## 2020-02-14 ENCOUNTER — Other Ambulatory Visit: Payer: Self-pay | Admitting: Cardiology

## 2020-08-11 ENCOUNTER — Other Ambulatory Visit: Payer: Self-pay | Admitting: Cardiology

## 2020-09-03 DIAGNOSIS — I2510 Atherosclerotic heart disease of native coronary artery without angina pectoris: Secondary | ICD-10-CM | POA: Diagnosis not present

## 2020-09-03 DIAGNOSIS — E785 Hyperlipidemia, unspecified: Secondary | ICD-10-CM | POA: Diagnosis not present

## 2020-09-03 DIAGNOSIS — Z125 Encounter for screening for malignant neoplasm of prostate: Secondary | ICD-10-CM | POA: Diagnosis not present

## 2020-09-03 DIAGNOSIS — I10 Essential (primary) hypertension: Secondary | ICD-10-CM | POA: Diagnosis not present

## 2020-09-03 DIAGNOSIS — Z0000 Encounter for general adult medical examination without abnormal findings: Secondary | ICD-10-CM | POA: Diagnosis not present

## 2020-09-03 DIAGNOSIS — R7309 Other abnormal glucose: Secondary | ICD-10-CM | POA: Diagnosis not present

## 2020-09-24 DIAGNOSIS — I10 Essential (primary) hypertension: Secondary | ICD-10-CM | POA: Diagnosis not present

## 2020-11-07 DIAGNOSIS — I10 Essential (primary) hypertension: Secondary | ICD-10-CM | POA: Diagnosis not present

## 2020-11-11 DIAGNOSIS — Z23 Encounter for immunization: Secondary | ICD-10-CM | POA: Diagnosis not present

## 2021-02-11 ENCOUNTER — Other Ambulatory Visit: Payer: Self-pay | Admitting: Cardiology

## 2021-03-17 ENCOUNTER — Other Ambulatory Visit: Payer: Self-pay | Admitting: Cardiology

## 2021-05-21 ENCOUNTER — Encounter: Payer: Self-pay | Admitting: Cardiology

## 2021-05-21 ENCOUNTER — Ambulatory Visit (INDEPENDENT_AMBULATORY_CARE_PROVIDER_SITE_OTHER): Payer: PPO | Admitting: Cardiology

## 2021-05-21 VITALS — BP 142/78 | HR 94 | Ht 70.0 in | Wt 218.6 lb

## 2021-05-21 DIAGNOSIS — I10 Essential (primary) hypertension: Secondary | ICD-10-CM | POA: Diagnosis not present

## 2021-05-21 DIAGNOSIS — I2510 Atherosclerotic heart disease of native coronary artery without angina pectoris: Secondary | ICD-10-CM | POA: Diagnosis not present

## 2021-05-21 DIAGNOSIS — E7800 Pure hypercholesterolemia, unspecified: Secondary | ICD-10-CM

## 2021-05-21 NOTE — Addendum Note (Signed)
Addended by: Antonieta Iba on: 05/21/2021 11:37 AM ? ? Modules accepted: Orders ? ?

## 2021-05-21 NOTE — Patient Instructions (Signed)
Medication Instructions:  Your physician recommends that you continue on your current medications as directed. Please refer to the Current Medication list given to you today.  *If you need a refill on your cardiac medications before your next appointment, please call your pharmacy*   Lab Work: Come back for fasting lipids and ALT If you have labs (blood work) drawn today and your tests are completely normal, you will receive your results only by: MyChart Message (if you have MyChart) OR A paper copy in the mail If you have any lab test that is abnormal or we need to change your treatment, we will call you to review the results.   Follow-Up: At [facility], you and your health needs are our priority.  As part of our continuing mission to provide you with exceptional heart care, we have created designated Provider Care Teams.  These Care Teams include your primary Cardiologist (physician) and Advanced Practice Providers (APPs -  Physician Assistants and Nurse Practitioners) who all work together to provide you with the care you need, when you need it.  Your next appointment:   1 year(s)  The format for your next appointment:   In Person  Provider:   Traci Turner, MD    

## 2021-05-21 NOTE — Progress Notes (Signed)
? ?Date:  05/21/2021  ? ?ID:  Sean [name], DOB 04-11-1950, MRN [id number] ? ?PCP:  [name], MD  ?Cardiologist:   ?Electrophysiologist:  None  ? ?Chief Complaint:  CAD, HTN, HLD ? ?History of Present Illness:   ? ?Sean [name] is a 71 y.o. male with a hx of ASCAD s/p BMS to LAD, dyslipidemia and HTN. He is here today for followup and is doing well.  He denies any chest pain or pressure, SOB, DOE, PND, orthopnea, LE edema, dizziness, palpitations or syncope. He is compliant with his meds and is tolerating meds with no SE.    ?Prior CV studies:   ?The following studies were reviewed today: ? ?EKG ? ?Past Medical History:  ?Diagnosis Date  ? Alcohol addiction (HCC)   ? 2010- admitted for withdrawal , detox admission after that  ? Coronary artery disease   ? 95% mid LAD s/p BMS of LAD with aneurysmal segment  ? Depression   ? ED (erectile dysfunction)   ? HLD (hyperlipidemia)   ? HTN (hypertension)   ? Mental disorder   ? Peptic ulcer   ? RBBB 10/24/2015  ? ?Past Surgical History:  ?Procedure Laterality Date  ? CORONARY STENT PLACEMENT    ?  ? ?Current Meds  ?Medication Sig  ? atorvastatin (LIPITOR) 80 MG tablet TAKE ONE TABLET BY MOUTH ONE TIME DAILY  ? clopidogrel (PLAVIX) 75 MG tablet TAKE ONE TABLET BY MOUTH ONE TIME DAILY  ? ezetimibe (ZETIA) 10 MG tablet TAKE ONE TABLET BY MOUTH ONE TIME DAILY  ? lisinopril (ZESTRIL) 20 MG tablet Take 20 mg by mouth daily.  ? Multiple Vitamin (MULITIVITAMIN WITH MINERALS) TABS Take 1 tablet by mouth daily.  ? vitamin C (ASCORBIC ACID) 500 MG tablet Take 500 mg by mouth daily.  ?  ? ?Allergies:   Patient has no known allergies.  ? ?Social History  ? ?Tobacco Use  ? Smoking status: Former  ?  Types: Cigarettes  ?  Quit date: 18  ?  Years since quitting: 33.2  ? Smokeless tobacco: Never  ?Vaping Use  ? Vaping Use: Never used  ?Substance Use Topics  ? Alcohol use: No  ?  Comment: quit 07/2006  ? Drug use: No  ?  ? ?Family Hx: ?The patient's family history includes Heart attack (age  of onset: 70) in his brother; Prostate cancer in his father. There is no history of Anesthesia problems, Hypotension, Malignant hyperthermia, or Pseudochol deficiency. ? ?ROS:   ?Please see the history of present illness.    ? ?All other systems reviewed and are negative. ? ? ?Labs/Other Tests and Data Reviewed:   ? ?Recent Labs: ?No results found for requested labs within last 8760 hours.  ? ?Recent Lipid Panel ?Lab Results  ?Component Value Date/Time  ? CHOL 185 12/19/2018 09:05 AM  ? TRIG 69 12/19/2018 09:05 AM  ? HDL 93 12/19/2018 09:05 AM  ? CHOLHDL 2.0 12/19/2018 09:05 AM  ? CHOLHDL 2.2 11/18/2015 07:48 AM  ? LDLCALC 79 12/19/2018 09:05 AM  ? ? ?Wt Readings from Last 3 Encounters:  ?05/21/21 218 lb 9.6 oz (99.2 kg)  ?01/03/20 216 lb 12.8 oz (98.3 kg)  ?10/26/18 210 lb (95.3 kg)  ?  ? ?Objective:   ? ?Vital Signs:  BP (!) 142/78   Pulse 94   Ht 5\' 10"  (1.778 m)   Wt 218 lb 9.6 oz (99.2 kg)   SpO2 97%   BMI 31.37 kg/m?   ? ?GEN:  Well nourished, well developed in no acute distress ?HEENT: Normal ?NECK: No JVD; No carotid bruits ?LYMPHATICS: No lymphadenopathy ?CARDIAC:RRR, no murmurs, rubs, gallops ?RESPIRATORY:  Clear to auscultation without rales, wheezing or rhonchi  ?ABDOMEN: Soft, non-tender, non-distended ?MUSCULOSKELETAL:  No edema; No deformity  ?SKIN: Warm and dry ?NEUROLOGIC:  Alert and oriented x 3 ?PSYCHIATRIC:  Normal affect   ? ?EKG was done today and showed  NSR with RBBB and LPFB  ? ?ASSESSMENT & PLAN:   ? ?1.  ASCAD  ?-s/p BMS to the LAD ?-He has not had any anginal symptoms since I saw him last ?-Continue prescription drug management with Plavix 75 mg daily, high-dose statin with as needed refills ?-not on ASA due to increased bleeding.  ? ?2.  HTN  ?-BP is controlled on exam today ?-continue prescription drug management with Lisinopril 20mg  daily with PRN refills ? ?3.  Hyperlipidemia ?-LDL goal < 70 ?-Check FLP and ALT ?-Continue prescription drug management with atorvastatin 80 mg daily  and Zetia 10 mg daily with as needed refills ? ?Medication Adjustments/Labs and Tests Ordered: ?Current medicines are reviewed at length with the patient today.  Concerns regarding medicines are outlined above.  ?Tests Ordered: ?Orders Placed This Encounter  ?Procedures  ? EKG 12-Lead  ? ?Medication Changes: ?No orders of the defined types were placed in this encounter. ? ? ?Disposition:  Follow up in 1 year(s) ? ?Signed, ? , MD  ?05/21/2021 11:31 AM    ?[facility] ?

## 2021-05-22 ENCOUNTER — Other Ambulatory Visit: Payer: PPO

## 2021-05-22 DIAGNOSIS — I10 Essential (primary) hypertension: Secondary | ICD-10-CM | POA: Diagnosis not present

## 2021-05-22 DIAGNOSIS — I2510 Atherosclerotic heart disease of native coronary artery without angina pectoris: Secondary | ICD-10-CM

## 2021-05-22 DIAGNOSIS — E7800 Pure hypercholesterolemia, unspecified: Secondary | ICD-10-CM

## 2021-05-22 LAB — LIPID PANEL
Chol/HDL Ratio: 3 ratio (ref 0.0–5.0)
Cholesterol, Total: 137 mg/dL (ref 100–199)
HDL: 45 mg/dL (ref >39)
LDL Chol Calc (NIH): 79 mg/dL (ref 0–99)
Triglycerides: 60 mg/dL (ref 0–149)
VLDL Cholesterol Cal: 13 mg/dL (ref 5–40)

## 2021-05-22 LAB — ALT: ALT: 28 IU/L (ref 0–44)

## 2021-05-26 ENCOUNTER — Telehealth: Payer: Self-pay | Admitting: Cardiology

## 2021-05-26 ENCOUNTER — Other Ambulatory Visit: Payer: Self-pay | Admitting: Cardiology

## 2021-05-26 MED ORDER — CLOPIDOGREL BISULFATE 75 MG PO TABS: 75.0000 mg | ORAL_TABLET | Freq: Every day | ORAL | 3 refills | Status: DC

## 2021-05-26 MED ORDER — ATORVASTATIN CALCIUM 80 MG PO TABS: 80.0000 mg | ORAL_TABLET | Freq: Every day | ORAL | 3 refills | Status: DC

## 2021-05-26 NOTE — Telephone Encounter (Signed)
-----   Message from Leeroy Bock, [place] sent at 05/26/2021  7:52 AM EDT ----- ?Would confirm that pt is adherent to atorvastatin 80mg  daily and ezetimibe 10mg  daily. If so, would schedule pt with PharmD in lipid clinic to discuss additional options (either Nexlizet or [id number]). ?

## 2021-05-26 NOTE — Telephone Encounter (Signed)
Patient is returning call to discuss lab results. 

## 2021-05-26 NOTE — Telephone Encounter (Signed)
The patient has been notified of the result and verbalized understanding.  All questions (if any) were answered. ?Antonieta Iba, RN 05/26/2021 4:13 PM  ?Patient has not been taking zetia 10 mg daily.  ?Advised patient to restart.  ?

## 2021-05-27 NOTE — Telephone Encounter (Signed)
Restart Zetia 10 mg daily and repeat FLP and ALT in 8 weeks ? ? ?Called patient and scheduled repeat fasting lipids/LFT appointment for July 11.   ?

## 2021-06-03 ENCOUNTER — Telehealth: Payer: Self-pay | Admitting: Cardiology

## 2021-06-03 NOTE — Telephone Encounter (Signed)
Spoke with the patient who states that he restarted his zetia 10 mg daily and has been having GI issues. He complains of being abdominal pain, gas, and fatigue. He did also have a bit of blood in his stool the other day, which has since resolved.  ?Patient reports that he has not eaten anything out of the ordinary and that adding back zetia is the only change he has made.  ?Advised patient that he could try holding the zetia for a few days to see if symptoms resolve. Will check with pharmacy team to see if this could be related.  ?

## 2021-06-03 NOTE — Telephone Encounter (Signed)
Pt c/o medication issue: ? ?1. Name of Medication: ezetimibe (ZETIA) 10 MG tablet ? ?2. How are you currently taking this medication (dosage and times per day)? TAKE ONE TABLET BY MOUTH ONE TIME DAILY ? ?3. Are you having a reaction (difficulty breathing--STAT)? no ? ?4. What is your medication issue? Having abdominal pain, real gassy  ? ?

## 2021-06-03 NOTE — Telephone Encounter (Signed)
Yes, possible that symptoms are from Zetia. Would make sure he is taking with food. Agree with holding medication for a few days to see if symptoms resolve and then re challenging. If symptoms return then we would like to see pt in lipid clinic to discuss alternatives. ?

## 2021-06-16 ENCOUNTER — Telehealth: Payer: Self-pay | Admitting: Cardiology

## 2021-06-16 NOTE — Telephone Encounter (Signed)
Pt states that for the last few days he has been experiencing right arm pain, tingling and numbness. Pt states that the pain wakes him up out of his sleep. Please advise ?

## 2021-06-16 NOTE — Telephone Encounter (Signed)
Spoke with pt who states that he had an EKG about a month ago. He reports that over the last week he has numbness that turns into pain. States that he can not shake it off in the bed but when he gets up to go to the bathroom the pain goes away. Pt states that he feels he has a circulation problem. Please advise.  ?

## 2021-06-16 NOTE — Telephone Encounter (Signed)
Pt informed of Dr. Norris Cross note. Pt voiced understanding thankful for the call.  ?

## 2021-08-20 ENCOUNTER — Other Ambulatory Visit: Payer: Self-pay | Admitting: Cardiology

## 2021-08-26 ENCOUNTER — Other Ambulatory Visit: Payer: PPO

## 2021-08-26 ENCOUNTER — Other Ambulatory Visit: Payer: Self-pay

## 2021-08-26 DIAGNOSIS — I10 Essential (primary) hypertension: Secondary | ICD-10-CM

## 2021-08-26 DIAGNOSIS — E785 Hyperlipidemia, unspecified: Secondary | ICD-10-CM

## 2021-08-26 DIAGNOSIS — I2510 Atherosclerotic heart disease of native coronary artery without angina pectoris: Secondary | ICD-10-CM | POA: Diagnosis not present

## 2021-08-26 NOTE — Progress Notes (Signed)
Per Dr Laqueta Carina Zetia 10 mg daily and repeat FLP and ALT in 8 weeks     Called patient and scheduled repeat fasting lipids/LFT appointment for July 11.

## 2021-08-27 ENCOUNTER — Telehealth: Payer: Self-pay

## 2021-08-27 DIAGNOSIS — E7800 Pure hypercholesterolemia, unspecified: Secondary | ICD-10-CM

## 2021-08-27 LAB — LIPID PANEL
Chol/HDL Ratio: 2.5 ratio (ref 0.0–5.0)
Cholesterol, Total: 108 mg/dL (ref 100–199)
HDL: 44 mg/dL (ref >39)
LDL Chol Calc (NIH): 45 mg/dL (ref 0–99)
Triglycerides: 102 mg/dL (ref 0–149)
VLDL Cholesterol Cal: 19 mg/dL (ref 5–40)

## 2021-08-27 LAB — ALT: ALT: 55 IU/L — ABNORMAL HIGH (ref 0–44)

## 2021-08-27 NOTE — Telephone Encounter (Signed)
Left message for patient with results and sent through MyChart. Advised to call or message back to schedule labs to be done in one month. Labs have been ordered.

## 2021-08-27 NOTE — Telephone Encounter (Signed)
-----   Message from Quintella Reichert, MD sent at 08/27/2021 11:03 AM EDT ----- Lipids at goal.  ALT slightly elevated.  Avoid alcohol and Tylenol.  Likely some fatty liver.  Would encourage weight loss and exercise.  Repeat LFTs in 4 weeks

## 2021-09-12 DIAGNOSIS — I10 Essential (primary) hypertension: Secondary | ICD-10-CM | POA: Diagnosis not present

## 2021-09-12 DIAGNOSIS — W19XXXA Unspecified fall, initial encounter: Secondary | ICD-10-CM | POA: Diagnosis not present

## 2021-09-12 DIAGNOSIS — R202 Paresthesia of skin: Secondary | ICD-10-CM | POA: Diagnosis not present

## 2021-09-12 DIAGNOSIS — S0990XA Unspecified injury of head, initial encounter: Secondary | ICD-10-CM | POA: Diagnosis not present

## 2021-09-12 DIAGNOSIS — R7309 Other abnormal glucose: Secondary | ICD-10-CM | POA: Diagnosis not present

## 2021-09-17 ENCOUNTER — Encounter: Payer: Self-pay | Admitting: Physical Therapy

## 2021-09-17 ENCOUNTER — Ambulatory Visit: Payer: PPO | Attending: Family Medicine | Admitting: Physical Therapy

## 2021-09-17 ENCOUNTER — Other Ambulatory Visit: Payer: Self-pay

## 2021-09-17 DIAGNOSIS — R293 Abnormal posture: Secondary | ICD-10-CM | POA: Insufficient documentation

## 2021-09-17 DIAGNOSIS — M5412 Radiculopathy, cervical region: Secondary | ICD-10-CM | POA: Insufficient documentation

## 2021-09-17 DIAGNOSIS — R252 Cramp and spasm: Secondary | ICD-10-CM | POA: Diagnosis not present

## 2021-09-17 NOTE — Therapy (Signed)
OUTPATIENT PHYSICAL THERAPY CERVICAL EVALUATION   Patient Name: Sean [name] MRN: [id number] DOB:1950/06/13, 70 y.o., male Today's Date: 09/17/2021   PT End of Session - 09/17/21 1342     Visit Number 1    Date for PT Re-Evaluation 11/12/21    Authorization Type [employer]    Progress Note Due on Visit 10    PT Start Time 1017    PT Stop Time 1100    PT Time Calculation (min) 43 min    Activity Tolerance Patient tolerated treatment well    Behavior During Therapy [facility] for tasks assessed/performed             Past Medical History:  Diagnosis Date   Alcohol addiction (HCC)    2010- admitted for withdrawal , detox admission after that   Coronary artery disease    95% mid LAD s/p BMS of LAD with aneurysmal segment   Depression    ED (erectile dysfunction)    HLD (hyperlipidemia)    HTN (hypertension)    Mental disorder    Peptic ulcer    RBBB 10/24/2015   Past Surgical History:  Procedure Laterality Date   CORONARY STENT PLACEMENT     Patient Active Problem List   Diagnosis Date Noted   RBBB 10/24/2015   Coronary artery disease    Tachycardia 05/09/2011   Abnormal liver enzymes 05/06/2011   Alcohol withdrawal (HCC) 04/29/2011   HTN (hypertension) 04/29/2011   HLD (hyperlipidemia) 04/29/2011    PCP: [name], MD   REFERRING PROVIDER: [name], MD   REFERRING DIAG: R20.2 (ICD-10-CM) - Paresthesia of skin  (bil hands)  THERAPY DIAG:  Abnormal posture  Cramp and spasm  Radiculopathy, cervical region  Rationale for Evaluation and Treatment Rehabilitation  ONSET DATE: 06/01/21  SUBJECTIVE:                                                                                                                                                                                                         SUBJECTIVE STATEMENT: Pt suffered a fall when his dogs knocked him over backwards while walking downhill and he hit his head.  Pt has not had any imaging  done.  Since then he notes whole hand numbness bil when sleeping on sides.  Additionally has Rt forearm pain.  Intermittent numbness in bil hands during day - he can change positions and relieve.  Maybe happens more when sitting on softer surfaces like couch/recliner vs stiff supportive chair.   Symptoms awakens him at night.  No numbness when sleeping on back.  Pt has no neck pain.  PERTINENT HISTORY:  CAD, coronary stent placement, HTN, HLD  PAIN:  PAIN:  Are you having pain? Yes NPRS scale: 5/10 Pain location: Rt forearm, bil hand numbness Pain orientation: Bilateral  PAIN TYPE: aching and numbness (bil hands) Pain description: intermittent  Aggravating factors: sleeping on sides, sometimes needs to change positions in sitting during the day Relieving factors: turn onto back at night and symptoms abate within minutes   PRECAUTIONS: None  WEIGHT BEARING RESTRICTIONS No  FALLS:  Has patient fallen in last 6 months? Yes. 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[employer], carpentry work  PLOF: Independent  PATIENT GOALS sleep through night without symptoms  OBJECTIVE:   DIAGNOSTIC FINDINGS:  none  PATIENT SURVEYS:  UEFS     COGNITION: Overall cognitive status: Within functional limits for tasks assessed   SENSATION: Pt reports bil hand numbness (whole hand) at night  POSTURE: rounded shoulders and forward head, Lt shoulder elevated compared to Rt  PALPATION: Tender Lt cervical mutlifidi, Lt paraspinals, bil upper traps very tight   CERVICAL ROM:   Active ROM A/PROM (deg) eval  Flexion 45  Extension 30  Right lateral flexion 30  Left lateral flexion 30 mild Rt sided neck pain  Right rotation 65  Left rotation 65   (Blank rows = not tested)  UPPER EXTREMITY ROM:  Active ROM Right eval Left eval   Shoulder flexion End range limited End range limited  Shoulder extension End range limited End range limited  Shoulder abduction End range limited End range limited  Shoulder adduction    Shoulder extension    Shoulder internal rotation Limited functional IR compared to Lt   Shoulder external rotation    Elbow flexion    Elbow extension    Wrist flexion    Wrist extension    Wrist ulnar deviation    Wrist radial deviation    Wrist pronation    Wrist supination     (Blank rows = not tested)  UPPER EXTREMITY MMT: WFL  CERVICAL SPECIAL TESTS:  TOS tests negative Spurling's test negative Prolonged wrist flexion with active grip negative for numbness onset + ULTT ulnar nerve on Rt, negative radial and median ULTT bil   TODAY'S TREATMENT:  09/17/21: (eval) Initiated HEP and gave DN info   PATIENT EDUCATION:  Education details: Access Code: [email or web address] Person educated: Patient Education method: Programmer, multimedia, Facilities manager, Verbal cues, and Handouts Education comprehension: verbalized understanding and returned demonstration   HOME EXERCISE PROGRAM: Access Code: [email or web address] URL: [email or web address] Date: 09/17/2021 Prepared by: Loistine Simas King Pinzon  Exercises - Supine Cervical Retraction with Towel  - 3 x daily - 7 x weekly - 2 sets - 10 reps - 10 hold - Seated Scapular Retraction  - 3 x daily - 7 x weekly - 2 sets - 10 reps - 10 hold - Seated Cervical Retraction  - 3 x daily - 7 x weekly - 2 sets - 10 reps - Seated Backward Shoulder Rolls  - 3 x daily - 7 x weekly - 1 sets - 10 reps - Ulnar Nerve Flossing  - 3 x daily - 7 x weekly - 1 sets - 20 reps - Standing Ulnar Nerve Glide  - 3 x daily - 7 x weekly - 1 sets - 10 reps  ASSESSMENT:  CLINICAL IMPRESSION: Patient is a 71  y.o. male who had a fall when he was knocked over by his dogs several months ago.  He fell backwards and hit his head.  Since then he has experienced bil hand numbness at night.  He also has Rt  forearm pain at night.  Symptoms are present in whole hand and experienced when sleeping on sides.  Intermittent bil hand numbness during day when seated on compliant furniture watching TV.  Numbness resolves at night when he rolls to his back and during day with positional change.  Pt has had carpal tunnel before and states this is different.  Pt has + ULTT on Rt ulnar, forward head posture, and spasms present along bil UT and cervical soft tissues limiting mobility.  PT could not recreate symptoms today with special tests - Pt states it can take 30 min before symptom onset at home.  HEP initiated and Pt education provided on pillows/posture in SL and dry needling info.   OBJECTIVE IMPAIRMENTS decreased ROM, hypomobility, increased muscle spasms, impaired flexibility, impaired sensation, postural dysfunction, and pain.   ACTIVITY LIMITATIONS sleeping  PARTICIPATION LIMITATIONS:  sleeping  PERSONAL FACTORS Time since onset of injury/illness/exacerbation are also affecting patient's functional outcome.   REHAB POTENTIAL: Good  CLINICAL DECISION MAKING: Stable/uncomplicated  EVALUATION COMPLEXITY: Low   GOALS: Goals reviewed with patient? Yes  SHORT TERM GOALS: Target date: 10/15/2021   Pt will demo knowledge of improved posture and report compliance while watching TV Baseline:  Goal status: INITIAL  2.  Pt will be ind with initial HEP Baseline:  Goal status: INITIAL  3.  Pt will report reduced bil hand numbness and Rt forearm pain at night by at least 30% Baseline:  Goal status: INITIAL    LONG TERM GOALS: Target date: 11/12/2021  Pt will be ind with advanced HEP for improved mobility and management of symptoms. Baseline:  Goal status: INITIAL  2.  Pt will be able to sleep through night with min to no disturbances of pain and numbness Baseline:  Goal status: INITIAL  3.  Pt will report no numbness in bil hands at least 5 out of 7 days of the week and be able to quickly  adjust posture to eliminate symptoms at onset. Baseline:  Goal status: INITIAL    PLAN: PT FREQUENCY: 1x/week  PT DURATION: 8 weeks  PLANNED INTERVENTIONS: Therapeutic exercises, Therapeutic activity, Neuromuscular re-education, Patient/Family education, Self Care, Joint mobilization, Dry Needling, Electrical stimulation, Spinal mobilization, Cryotherapy, Moist heat, Traction, and Manual therapy  PLAN FOR NEXT SESSION: DN cervical if Pt agreeable, review HEP and add doorway pec stretch, UE tbands, manual traction/mobs and STM cervical, reiterate importance of posture.  Did Pt assess pillow set up for neutral spine in SL and adjust posture while watching TV to see if helps symptoms?   Shaiann Mcmanamon, PT 09/17/21 1:43 PM

## 2021-09-17 NOTE — Patient Instructions (Signed)

## 2021-09-23 ENCOUNTER — Other Ambulatory Visit: Payer: PPO

## 2021-09-23 DIAGNOSIS — E7800 Pure hypercholesterolemia, unspecified: Secondary | ICD-10-CM | POA: Diagnosis not present

## 2021-09-23 LAB — HEPATIC FUNCTION PANEL
ALT: 63 IU/L — ABNORMAL HIGH (ref 0–44)
AST: 72 IU/L — ABNORMAL HIGH (ref 0–40)
Albumin: 3.9 g/dL (ref 3.9–4.9)
Alkaline Phosphatase: 63 IU/L (ref 44–121)
Bilirubin Total: 0.5 mg/dL (ref 0.0–1.2)
Bilirubin, Direct: 0.19 mg/dL (ref 0.00–0.40)
Total Protein: 6 g/dL (ref 6.0–8.5)

## 2021-09-24 ENCOUNTER — Telehealth: Payer: Self-pay

## 2021-09-24 DIAGNOSIS — E785 Hyperlipidemia, unspecified: Secondary | ICD-10-CM

## 2021-09-24 DIAGNOSIS — E7800 Pure hypercholesterolemia, unspecified: Secondary | ICD-10-CM

## 2021-09-24 NOTE — Telephone Encounter (Signed)
-----   Message from Awilda Metro, RPH-CPP sent at 09/24/2021 10:22 AM EDT ----- Mild elevation in LFTs after pt started Zetia therapy. Since elevation is mild, it doesn't warrant discontinuation of either med class. Could replace atorvastatin with rosuvastatin 40mg  daily, this is usually a bit less likely to affect LFTs. Would also confirm pt hasn't been using Tylenol or alcohol in excess, and recheck LFTs in another month or so.

## 2021-10-01 ENCOUNTER — Ambulatory Visit: Payer: PPO | Admitting: Physical Therapy

## 2021-10-01 ENCOUNTER — Encounter: Payer: Self-pay | Admitting: Physical Therapy

## 2021-10-01 DIAGNOSIS — M5412 Radiculopathy, cervical region: Secondary | ICD-10-CM

## 2021-10-01 DIAGNOSIS — R293 Abnormal posture: Secondary | ICD-10-CM

## 2021-10-01 DIAGNOSIS — R252 Cramp and spasm: Secondary | ICD-10-CM

## 2021-10-01 NOTE — Therapy (Addendum)
OUTPATIENT PHYSICAL THERAPY TREATMENT NOTE   Patient Name: Sean [name] MRN: [id number] DOB:[birth year], 71 y.o., male Today's Date: 10/01/2021  PCP: [name], MD  REFERRING PROVIDER: [name], MD   END OF SESSION:   PT End of Session - 10/01/21 0937     Visit Number 2    Date for PT Re-Evaluation 11/12/21    Authorization Type [employer]    Progress Note Due on Visit 10    PT Start Time 0932    PT Stop Time 1010    PT Time Calculation (min) 38 min    Activity Tolerance Patient tolerated treatment well    Behavior During Therapy [facility] for tasks assessed/performed             Past Medical History:  Diagnosis Date   Alcohol addiction ([place])    2010- admitted for withdrawal , detox admission after that   Coronary artery disease    95% mid LAD s/p BMS of LAD with aneurysmal segment   Depression    ED (erectile dysfunction)    HLD (hyperlipidemia)    HTN (hypertension)    Mental disorder    Peptic ulcer    RBBB 10/24/2015   Past Surgical History:  Procedure Laterality Date   CORONARY STENT PLACEMENT     Patient Active Problem List   Diagnosis Date Noted   RBBB 10/24/2015   Coronary artery disease    Tachycardia 05/09/2011   Abnormal liver enzymes 05/06/2011   Alcohol withdrawal ([place]) 04/29/2011   HTN (hypertension) 04/29/2011   HLD (hyperlipidemia) 04/29/2011    REFERRING DIAG: R20.2 (ICD-10-CM) - Paresthesia of skin   THERAPY DIAG:  Abnormal posture  Cramp and spasm  Radiculopathy, cervical region  Rationale for Evaluation and Treatment Rehabilitation  PERTINENT HISTORY: CAD, coronary stent placement, HTN, HLD, Hx of alcohol abuse  PRECAUTIONS: Falls, 1x when dogs knocked him over  TODAY'S SUBJECTIVE:  I am getting better.  I can change positions in both sleeping and sitting and relieve numbness in hands.  It isn't happening as much anymore now that I'm more posture aware.  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 The Rt forearm pain is totally resolved.   SUBJECTIVE Evaluation: Pt suffered a fall when his dogs knocked him over backwards while walking downhill and he hit his head.  Pt has not had any imaging done.  Since then he notes whole hand numbness bil when sleeping on sides.  Additionally has Rt forearm pain.  Intermittent numbness in bil hands during day - he can change positions and relieve.  Maybe happens more when sitting on softer surfaces like couch/recliner vs stiff supportive chair.   Symptoms awakens him at night.  No numbness when sleeping on back. Pt has no neck pain.  PAIN:  Are you having pain? 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[employer], carpentry work   PLOF: Independent   PATIENT GOALS sleep through night without symptoms   OBJECTIVE: (objective measures completed at initial evaluation unless otherwise dated)   DIAGNOSTIC FINDINGS:  none   PATIENT SURVEYS:  UEFS  COGNITION: Overall cognitive status: Within functional limits for tasks assessed     SENSATION: Pt reports bil hand numbness (whole hand) at night   POSTURE: rounded shoulders and forward head, Lt shoulder elevated compared to Rt   PALPATION: Tender Lt cervical mutlifidi, Lt paraspinals, bil upper traps very tight     CERVICAL ROM:    Active ROM A/PROM (deg) eval  Flexion 45  Extension 30  Right lateral flexion 30  Left lateral flexion 30 mild Rt sided neck pain  Right rotation 65  Left rotation 65   (Blank rows = not tested)   UPPER EXTREMITY ROM:   Active ROM Right eval Left eval   Shoulder flexion End range limited End range limited  Shoulder extension End range limited End range limited  Shoulder abduction End range limited End range limited  Shoulder adduction      Shoulder extension      Shoulder internal rotation Limited functional IR compared to Lt    Shoulder external rotation      Elbow flexion      Elbow extension      Wrist flexion      Wrist extension      Wrist ulnar deviation      Wrist radial deviation      Wrist pronation      Wrist supination       (Blank rows = not tested)   UPPER EXTREMITY MMT: WFL   CERVICAL SPECIAL TESTS:  TOS tests negative Spurling's test negative Prolonged wrist flexion with active grip negative for numbness onset + ULTT ulnar nerve on Rt, negative radial and median ULTT bil     TODAY'S TREATMENT:  10/01/21: UBE L2.5 2x2 PT present to discuss symptom behavior Standing neck retraction into ball on wall 10x5" Standing row green x 20, VC to maintain neck retraction Standing bil shoulder extension green x 20, VC to maintain neck retraction Pec doorway stretch 5x10" Seated neck rotation in retraction x 10  Seated ulnar nerve flossing in prayer hands x 5 (no tension noted today compared to eval) Seated cervical SB stretch 1x30" each side  09/17/21: (eval) Initiated HEP and gave DN info     PATIENT EDUCATION:  Education details: Access Code: [email or web address] Person educated: Patient Education method: Consulting civil engineer, Media planner, Verbal cues, and Handouts Education comprehension: verbalized understanding and returned demonstration     HOME EXERCISE PROGRAM: Access Code: [email or web address] URL: [email or web address] Date: 10/01/2021 Prepared by: Venetia Night   Exercises - Supine Cervical Retraction with Towel  - 3 x daily - 7 x weekly - 2 sets - 10 reps - 10 hold - Seated Scapular Retraction  - 3 x daily - 7 x weekly - 2 sets - 10 reps - 10 hold - Seated Cervical Retraction  - 3 x daily - 7 x weekly - 2 sets -  10 reps - Seated Backward Shoulder Rolls  - 3 x daily - 7 x weekly - 1 sets - 10 reps - Ulnar Nerve Flossing  - 3 x daily - 7 x weekly - 1 sets - 20 reps - Standing Ulnar Nerve Glide  - 3 x daily - 7 x weekly - 1 sets - 10 reps - Standing Bilateral Low Shoulder Row with Anchored Resistance  - 1 x daily - 7 x weekly - 1 sets - 20 reps - Standing Shoulder Extension with Resistance  - 1 x daily - 7 x weekly - 1 sets - 20 reps - Doorway Pec Stretch at 60 Elevation  -  1 x daily - 7 x weekly - 1 sets - 5 reps - 10 hold - Seated Cervical Sidebending Stretch  - 1 x daily - 7 x weekly - 1 sets - 2 reps - 20 hold   ASSESSMENT:   CLINICAL IMPRESSION: Pt reports less interruption of sleep from numbness and 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  Pt reports complete resolution of Rt forearm pain since evaluation.  He has been compliant with HEP.  PT progressed postural strength, ROM and cervical stretching without onset of symptoms.  Pt continues to be tight and tender with Tps present in upper traps which may benefit from DN next visit     OBJECTIVE IMPAIRMENTS decreased ROM, hypomobility, increased muscle spasms, impaired flexibility, impaired sensation, postural dysfunction, and pain.    ACTIVITY LIMITATIONS sleeping   PARTICIPATION LIMITATIONS:  sleeping   PERSONAL FACTORS Time since onset of injury/illness/exacerbation are also affecting patient's functional outcome.    REHAB POTENTIAL: Good   CLINICAL DECISION MAKING: Stable/uncomplicated   EVALUATION COMPLEXITY: Low     GOALS: Goals reviewed with patient? Yes   SHORT TERM GOALS: Target date: 10/15/2021    Pt will demo knowledge of improved posture and report compliance while watching TV Baseline:  Goal status: met   2.  Pt will be ind with initial HEP Baseline:  Goal status: met   3.  Pt will report reduced bil hand numbness and Rt forearm pain at night by at least 30% Baseline:  Goal status:  met       LONG TERM GOALS: Target date: 11/12/2021   Pt will be ind with advanced HEP for improved mobility and management of symptoms. Baseline:  Goal status: ongoing   2.  Pt will be able to sleep through night with min to no disturbances of pain and numbness Baseline:  Goal status: ongoing   3.  Pt will report no numbness in bil hands at least 5 out of 7 days of the week and be able to quickly adjust posture to eliminate symptoms at onset. Baseline:  Goal status: ongoing       PLAN: PT FREQUENCY: 1x/week   PT DURATION: 8 weeks   PLANNED INTERVENTIONS: Therapeutic exercises, Therapeutic activity, Neuromuscular re-education, Patient/Family education, Self Care, Joint mobilization, Dry Needling, Electrical stimulation, Spinal mobilization, Cryotherapy, Moist heat, Traction, and Manual therapy   PLAN FOR NEXT SESSION: DN cervical if Pt agreeable, review HEP and add doorway pec stretch, UE tbands, manual traction/mobs and STM cervical, reiterate importance of posture.  Did Pt assess pillow set up for neutral spine in SL and adjust posture while watching TV to see if helps symptoms?    , PT 10/01/21 10:10 AM   PHYSICAL THERAPY DISCHARGE SUMMARY  Visits from Start of Care: 2  Current functional level related to goals / functional outcomes: Pt called to d/c himself due to complete resolution of symptoms with HEP   Remaining deficits: See above   Education / Equipment: HEP   Patient agrees to discharge. Patient goals were met. Patient is being discharged due to being pleased with the current functional level.   , PT 10/07/21 1:29 PM

## 2021-10-08 ENCOUNTER — Ambulatory Visit: Payer: PPO

## 2021-10-15 ENCOUNTER — Encounter: Payer: Self-pay | Admitting: Physical Therapy

## 2021-10-29 ENCOUNTER — Encounter: Payer: Self-pay | Admitting: Physical Therapy

## 2021-11-05 ENCOUNTER — Ambulatory Visit: Payer: PPO

## 2021-11-10 ENCOUNTER — Ambulatory Visit: Payer: PPO | Attending: Cardiology

## 2021-11-10 DIAGNOSIS — E785 Hyperlipidemia, unspecified: Secondary | ICD-10-CM

## 2021-11-10 DIAGNOSIS — E7800 Pure hypercholesterolemia, unspecified: Secondary | ICD-10-CM

## 2021-11-10 LAB — HEPATIC FUNCTION PANEL
ALT: 93 IU/L — ABNORMAL HIGH (ref 0–44)
AST: 118 IU/L — ABNORMAL HIGH (ref 0–40)
Albumin: 3.8 g/dL (ref 3.8–4.8)
Alkaline Phosphatase: 71 IU/L (ref 44–121)
Bilirubin Total: 0.6 mg/dL (ref 0.0–1.2)
Bilirubin, Direct: 0.23 mg/dL (ref 0.00–0.40)
Total Protein: 6.2 g/dL (ref 6.0–8.5)

## 2021-11-11 ENCOUNTER — Telehealth: Payer: Self-pay | Admitting: Internal Medicine

## 2021-11-11 DIAGNOSIS — E785 Hyperlipidemia, unspecified: Secondary | ICD-10-CM

## 2021-11-11 DIAGNOSIS — E7800 Pure hypercholesterolemia, unspecified: Secondary | ICD-10-CM

## 2021-11-11 NOTE — Telephone Encounter (Signed)
Pt is requesting call back in regards to labs. He is requesting future call back to be tomorrow 09/27 between 9 a.m. and 10 a.m.

## 2021-11-12 ENCOUNTER — Encounter: Payer: Self-pay | Admitting: Physical Therapy

## 2021-11-12 MED ORDER — ROSUVASTATIN CALCIUM 40 MG PO TABS: 40.0000 mg | ORAL_TABLET | Freq: Every day | ORAL | 3 refills | Status: DC

## 2021-11-12 NOTE — Telephone Encounter (Signed)
Antonieta Iba, RN  11/11/2021  1:16 PM EDT     Werner Lean, MD  Antonieta Iba, RN LFTs continue to increase slightly  Trial of rosuvastatin 40mg  daily in stead of atorvastatin  Education on Tylenol or alcohol in excess  Repeat LFTs in a month     The patient has been notified of the result and verbalized understanding.  All questions (if any) were answered. Antonieta Iba, RN 11/12/2021 12:02 PM  Patient agreeable to medication change and repeat labs.  He states that he does drink alcohol and was recently on vacation for a week for a family wedding so drank more than he normally does. He is now back to his normal routine and is cutting back. Patient also aware to avoid tylenol.

## 2021-12-14 ENCOUNTER — Encounter (HOSPITAL_BASED_OUTPATIENT_CLINIC_OR_DEPARTMENT_OTHER): Payer: Self-pay | Admitting: Cardiology

## 2021-12-15 ENCOUNTER — Ambulatory Visit: Payer: PPO | Attending: Cardiology

## 2021-12-15 DIAGNOSIS — E785 Hyperlipidemia, unspecified: Secondary | ICD-10-CM

## 2021-12-15 DIAGNOSIS — E7800 Pure hypercholesterolemia, unspecified: Secondary | ICD-10-CM | POA: Diagnosis not present

## 2021-12-15 NOTE — Progress Notes (Unsigned)
Cardiology Office Note:    Date:  12/16/2021   ID:  Sean [name], DOB 1950-04-05, MRN [id number]  PCP:  Sean Pepper, MD   [facility] Providers Cardiologist:  Sean Him, MD     Referring MD: Sean Pepper, MD   Chief Complaint: swelling, DOE  History of Present Illness:    Sean [name] is a pleasant 71 y.o. male with a hx of CAD s/p remote BMS to LAD on Plavix, RBBB, HTN, HLD, prior alcohol dependence, and elevated liver enzymes.   Prior remote stenting of LAD with low risk exercise myoview 10/2015. He has maintained consistent follow-up.  Last cardiology clinic visit was 05/21/2021 with Dr. Radford [name] at which time LDL was elevated. He admitted to not taking Zetia 10 mg daily.  This was reinitiated in addition to atorvastatin and LDL improved.  Due to elevated LFTs, he was switched to rosuvastatin and advised to decrease alcohol consumption. One year follow-up was recommended.  Today, he is here for evaluation of swelling in hands and feet for 6 months. Swelling does not vary based on diet or other factor that he can determine.  Cannot wear his wedding band due to tightness. Also notes increased SOB with walking, particularly when going up inclines for the past 3-4 months. Walks his dogs daily and recently had to stop when attempting to walk up a hill. Has a sensation that if he needs to take a deep breath, feels improvement with rest and deep breathing. Feels discomfort in thighs up to chest that feels like pressure. No associated diaphoresis, n/v. Prior angina was upper body discomfort prior to stent 15 years ago and this feels different. Has decreased alcohol intake. Drinks 1 large followed by 1 small IPA daily. Reports this is significantly less alcohol than in previous years. Lost 10 lbs and stopped lisinopril "long time ago."  Home BP 140/80. Mild palpitations "forever." No recent change in palpitations. Does most of the cooking and tries to avoid high sodium but admits to some  indiscretion. No abdominal pain, n/v, BRB.   Past Medical History:  Diagnosis Date   Alcohol addiction ([place])    2010- admitted for withdrawal , detox admission after that   Coronary artery disease    95% mid LAD s/p BMS of LAD with aneurysmal segment   Depression    ED (erectile dysfunction)    HLD (hyperlipidemia)    HTN (hypertension)    Mental disorder    Peptic ulcer    RBBB 10/24/2015    Past Surgical History:  Procedure Laterality Date   CORONARY STENT PLACEMENT      Current Medications: Current Meds  Medication Sig   clopidogrel (PLAVIX) 75 MG tablet Take 1 tablet (75 mg total) by mouth daily.   furosemide (LASIX) 20 MG tablet Take 1 tablet (20 mg total) by mouth daily.   lisinopril (ZESTRIL) 20 MG tablet Take 1 tablet (20 mg total) by mouth daily.   Multiple Vitamin (MULITIVITAMIN WITH MINERALS) TABS Take 1 tablet by mouth daily.   potassium chloride (KLOR-CON M) 10 MEQ tablet Take 1 tablet (10 mEq total) by mouth daily.   [DISCONTINUED] ezetimibe (ZETIA) 10 MG tablet TAKE ONE TABLET BY MOUTH ONE TIME DAILY   [DISCONTINUED] lisinopril (ZESTRIL) 20 MG tablet Take 20 mg by mouth daily.   [DISCONTINUED] rosuvastatin (CRESTOR) 40 MG tablet Take 1 tablet (40 mg total) by mouth daily.   [DISCONTINUED] vitamin C (ASCORBIC ACID) 500 MG tablet Take 500 mg by mouth daily.  Allergies:   Patient has no known allergies.   Social History   Socioeconomic History   Marital status: Married    Spouse name: Not on file   Number of children: Not on file   Years of education: Not on file   Highest education level: Not on file  Occupational History   Not on file  Tobacco Use   Smoking status: Former    Types: Cigarettes    Quit date: 1990    Years since quitting: 33.8   Smokeless tobacco: Never  Vaping Use   Vaping Use: Never used  Substance and Sexual Activity   Alcohol use: No    Comment: quit 07/2006   Drug use: No   Sexual activity: Yes    Birth control/protection:  None    Comment: Married  Other Topics Concern   Not on file  Social History Narrative   Live with wife in [place], is an Art gallery managerengineer, currently driving trucks for a local firm. Quit smoking 1990 after smoking for 20 years. Drinks heavilyu (1 bottle of vodka or 18-24 beers a day). Used to smoke marijuana but quit in 1990s. Has united healthcare insurance. Goes to [employer] meetings for alcohol cessation   Social Determinants of Health   Financial Resource Strain: Not on file  Food Insecurity: Not on file  Transportation Needs: Not on file  Physical Activity: Not on file  Stress: Not on file  Social Connections: Not on file     Family History: The patient's family history includes Heart attack (age of onset: 7167) in his brother; Prostate cancer in his father. There is no history of Anesthesia problems, Hypotension, Malignant hyperthermia, or Pseudochol deficiency.  ROS:   Please see the history of present illness.    + DOE + chest/trunk/thigh pressure All other systems reviewed and are negative.  Labs/Other Studies Reviewed:    The following studies were reviewed today:  Exercise Myoview 10/2015  Nuclear stress EF: 63%. The left ventricular ejection fraction is normal (55-65%). ST segment depression of 1 mm was noted during stress in the V5 and V6 leads. Defect 1: There is a small defect of mild severity present in the basal inferior location. Likely diaphragmatic attenuation. This is a low risk study. No significant ischemia identified.  Recent Labs: 12/15/2021: ALT 140  Recent Lipid Panel    Component Value Date/Time   CHOL 108 08/26/2021 0756   TRIG 102 08/26/2021 0756   HDL 44 08/26/2021 0756   CHOLHDL 2.5 08/26/2021 0756   CHOLHDL 2.2 11/18/2015 0748   VLDL 13 11/18/2015 0748   LDLCALC 45 08/26/2021 0756     Risk Assessment/Calculations:       Physical Exam:    VS:  BP (!) 148/88   Pulse (!) 109   Ht 5\' 10"  (1.778 m)   Wt 216 lb 6.4 oz (98.2 kg)   SpO2 93%    BMI 31.05 kg/m     Wt Readings from Last 3 Encounters:  12/16/21 216 lb 6.4 oz (98.2 kg)  05/21/21 218 lb 9.6 oz (99.2 kg)  01/03/20 216 lb 12.8 oz (98.3 kg)     GEN:  Well nourished, well developed in no acute distress HEENT: Normal NECK: No JVD; No carotid bruits CARDIAC: RRR, no murmurs, rubs, gallops RESPIRATORY:  Clear to auscultation without rales, wheezing or rhonchi  ABDOMEN: Soft, non-tender, non-distended MUSCULOSKELETAL:  Bilateral lower and upper extremity edema; No deformity. 2+ pedal pulses, equal bilaterally SKIN: Warm and dry NEUROLOGIC:  Alert and  oriented x 3 PSYCHIATRIC:  Normal affect   EKG:  EKG is not ordered today.  HYPERTENSION CONTROL Vitals:   12/16/21 0847 12/16/21 0913  BP: (!) 144/76 (!) 148/88    The patient's blood pressure is elevated above target today.  In order to address the patient's elevated BP: A new medication was prescribed today.      Diagnoses:    1. Coronary artery disease of native artery of native heart with stable angina pectoris ([place])   2. Swelling   3. Hyperlipidemia LDL goal <70   4. Primary hypertension   5. Abnormal liver enzymes   6. DOE (dyspnea on exertion)    Assessment and Plan:     CAD with stable angina: History of remote bare-metal stent to LAD. Symptoms of DOE and pressure from thighs up to chest over past 3-4 months. Symptoms worsen with exertion and improve with rest. Symptoms concerning for angina. Will get PET CT for evaluation of CAD. Advised [name] to resume lisinopril. Starting Lasix as noted below for DOE/swelling. No bleeding concerns. Continue Plavix.   DOE: Increased shortness of breath with exertion.  Had to stop when going up an incline on a walk recently.  Reports this is different from previous angina however is concerning to [name].  We will get echocardiogram to evaluate for structural heart disease. Starting Lasix in the setting of swelling as noted below.  Swelling: Bilateral lower and upper  extremity swelling for 6 months. States hand swelling has decreased today. Does not vary based on diet. We will start Lasix 20 mg and Kdur 10 mEq daily. Advised [name] to limit sodium intake. As noted above, getting echo for evaluation of systolic and diastolic function. Will check bmet in 1 week.   Hypertension: BP is elevated today and remains so on my recheck. Home BP typically 140s over 80s. He stopped his lisinopril "a long time ago. "  He will resume lisinopril 20 mg daily.  We are adding Lasix 20 mg daily as noted above for swelling.  I have asked [name] to continue to monitor home BP and report if no improvement.  Hyperlipidemia LDL goal < 70: LDL 45 on 08/26/21. We are discontinuing rosuvastatin in the setting of transaminitis. Will refer to Pharm D for management of lipids.   Elevated LFTs: Liver enzymes continue to increase despite stopping atorvastatin and starting rosuvastatin. AST 157 and ALT 140 on 12/15/21, an increase from lab work on 9/25. We will refer [name] to GI for evaluation of liver function. Advised [name] to stop rosuvastatin. Will refer to Pharm D for management of lipids.   Shared Decision Making/Informed Consent The risks [chest pain, shortness of breath, cardiac arrhythmias, dizziness, blood pressure fluctuations, myocardial infarction, stroke/transient ischemic attack, nausea, vomiting, allergic reaction, radiation exposure, metallic taste sensation and life-threatening complications (estimated to be 1 in 10,000)], benefits (risk stratification, diagnosing coronary artery disease, treatment guidance) and alternatives of a cardiac PET stress test were discussed in detail with Mr. Heyward and he agrees to proceed.   Disposition: 2 months with me  Medication Adjustments/Labs and Tests Ordered: Current medicines are reviewed at length with the patient today.  Concerns regarding medicines are outlined above.  Orders Placed This Encounter  Procedures   NM PET CT CARDIAC PERFUSION MULTI  W/ABSOLUTE BLOODFLOW   Basic Metabolic Panel (BMET)   AMB Referral to [facility] Pharm-[facility]   Ambulatory referral to Gastroenterology   Cardiac Stress Test: Informed Consent Details: Physician/Practitioner Attestation; Transcribe to consent form and obtain  patient signature   Meds ordered this encounter  Medications   furosemide (LASIX) 20 MG tablet    Sig: Take 1 tablet (20 mg total) by mouth daily.    Dispense:  90 tablet    Refill:  3   potassium chloride (KLOR-CON M) 10 MEQ tablet    Sig: Take 1 tablet (10 mEq total) by mouth daily.    Dispense:  90 tablet    Refill:  3   lisinopril (ZESTRIL) 20 MG tablet    Sig: Take 1 tablet (20 mg total) by mouth daily.    Dispense:  90 tablet    Refill:  3    Patient Instructions  Medication Instructions:   HOLD Rosuvastatin still you see Pharmacist.  START Lasix one (1) tablet by mouth ( 20 mg) daily.  START K-dur one (1) tablet by mouth ( 10 mEq) daily.   *If you need a refill on your cardiac medications before your next appointment, please call your pharmacy*   Lab Work:  Your physician recommends that you return for lab work on Tuesday, November 7. You can come in on the day of your appointment anytime between 7:30-4:30.  If you have labs (blood work) drawn today and your tests are completely normal, you will receive your results only by: [place] (if you have MyChart) OR A paper copy in the mail If you have any lab test that is abnormal or we need to change your treatment, we will call you to review the results.   Testing/Procedures:  How to Prepare for Your Cardiac PET/CT Stress Test:  1. Please do not take these medications before your test:   Medications that may interfere with the cardiac pharmacological stress agent (ex. nitrates - including erectile dysfunction medications or beta-blockers) the day of the exam. (Erectile dysfunction medication should be held for at least 72 hrs prior to test) Your remaining  medications may be taken with water.  2. Nothing to eat or drink, except water, 3 hours prior to arrival time.   NO caffeine/decaffeinated products, or chocolate 12 hours prior to arrival.  3. NO perfume, cologne or lotion  4. Total time is 1 to 2 hours; you may want to bring reading material for the waiting time.  5. Please report to Admitting at the [place] Entrance 60 minutes early for your test.  [place], [place] [address]  In preparation for your appointment, medication and supplies will be purchased.  Appointment availability is limited, so if you need to cancel or reschedule, please call the Radiology Department at [phone]  24 hours in advance to avoid a cancellation fee of $100.00  What to Expect After you Arrive:  Once you arrive and check in for your appointment, you will be taken to a preparation room within the Radiology Department.  A technologist or Nurse will obtain your medical history, verify that you are correctly prepped for the exam, and explain the procedure.  Afterwards,  an IV will be started in your arm and electrodes will be placed on your skin for EKG monitoring during the stress portion of the exam. Then you will be escorted to the PET/CT scanner.  There, staff will get you positioned on the scanner and obtain a blood pressure and EKG.  During the exam, you will continue to be connected to the EKG and blood pressure machines.  A small, safe amount of a radioactive tracer will be injected in your IV to obtain  a series of pictures of your heart along with an injection of a stress agent.    After your Exam:  It is recommended that you eat a meal and drink a caffeinated beverage to counter act any effects of the stress agent.  Drink plenty of fluids for the remainder of the day and urinate frequently for the first couple of hours after the exam.  Your doctor will inform you of your test results within 7-10 business days.  For  questions about your test or how to prepare for your test, please call: Marchia Bond, Cardiac Imaging Nurse Navigator  Gordy Clement, Cardiac Imaging Nurse Navigator Office: [phone]    Follow-Up: At [facility], you and your health needs are our priority.  As part of our continuing mission to provide you with exceptional heart care, we have created designated Provider Care Teams.  These Care Teams include your primary Cardiologist (physician) and Advanced Practice Providers (APPs -  Physician Assistants and Nurse Practitioners) who all work together to provide you with the care you need, when you need it.  We recommend signing up for the patient portal called "MyChart".  Sign up information is provided on this After Visit Summary.  MyChart is used to connect with patients for Virtual Visits (Telemedicine).  Patients are able to view lab/test results, encounter notes, upcoming appointments, etc.  Non-urgent messages can be sent to your provider as well.   To learn more about what you can do with MyChart, go to [email or web address].    Your next appointment:   2 month(s)  The format for your next appointment:   In Person  Provider:   Christen Bame, NP         Other Instructions  You have been referred to GI for abnormal Liver enzymes.  The office will call you to schedule appointment.  You have been referred to the Pharmacy for cholesterol.    Important Information About Sugar         Signed, Emmaline Life, NP  12/16/2021 12:09 PM    [place]

## 2021-12-16 ENCOUNTER — Encounter: Payer: Self-pay | Admitting: Nurse Practitioner

## 2021-12-16 ENCOUNTER — Ambulatory Visit: Payer: PPO | Attending: Nurse Practitioner | Admitting: Nurse Practitioner

## 2021-12-16 VITALS — BP 148/88 | HR 109 | Ht 70.0 in | Wt 216.4 lb

## 2021-12-16 DIAGNOSIS — R609 Edema, unspecified: Secondary | ICD-10-CM | POA: Diagnosis not present

## 2021-12-16 DIAGNOSIS — I25118 Atherosclerotic heart disease of native coronary artery with other forms of angina pectoris: Secondary | ICD-10-CM | POA: Diagnosis not present

## 2021-12-16 DIAGNOSIS — R0609 Other forms of dyspnea: Secondary | ICD-10-CM | POA: Diagnosis not present

## 2021-12-16 DIAGNOSIS — E785 Hyperlipidemia, unspecified: Secondary | ICD-10-CM | POA: Diagnosis not present

## 2021-12-16 DIAGNOSIS — R748 Abnormal levels of other serum enzymes: Secondary | ICD-10-CM | POA: Diagnosis not present

## 2021-12-16 DIAGNOSIS — I10 Essential (primary) hypertension: Secondary | ICD-10-CM | POA: Diagnosis not present

## 2021-12-16 LAB — HEPATIC FUNCTION PANEL
ALT: 140 IU/L — ABNORMAL HIGH (ref 0–44)
AST: 157 IU/L — ABNORMAL HIGH (ref 0–40)
Albumin: 3.9 g/dL (ref 3.8–4.8)
Alkaline Phosphatase: 65 IU/L (ref 44–121)
Bilirubin Total: 0.4 mg/dL (ref 0.0–1.2)
Bilirubin, Direct: 0.15 mg/dL (ref 0.00–0.40)
Total Protein: 6.3 g/dL (ref 6.0–8.5)

## 2021-12-16 MED ORDER — LISINOPRIL 20 MG PO TABS: 20.0000 mg | ORAL_TABLET | Freq: Every day | ORAL | 3 refills | Status: DC

## 2021-12-16 MED ORDER — POTASSIUM CHLORIDE CRYS ER 10 MEQ PO TBCR: 10.0000 meq | EXTENDED_RELEASE_TABLET | Freq: Every day | ORAL | 3 refills | Status: DC

## 2021-12-16 MED ORDER — FUROSEMIDE 20 MG PO TABS: 20.0000 mg | ORAL_TABLET | Freq: Every day | ORAL | 3 refills | Status: DC

## 2021-12-16 NOTE — Patient Instructions (Addendum)
Medication Instructions:   HOLD Rosuvastatin still you see Pharmacist.  START Lasix one (1) tablet by mouth ( 20 mg) daily.  START K-dur one (1) tablet by mouth ( 10 mEq) daily.   *If you need a refill on your cardiac medications before your next appointment, please call your pharmacy*   Lab Work:  Your physician recommends that you return for lab work on Tuesday, November 7. You can come in on the day of your appointment anytime between 7:30-4:30.  If you have labs (blood work) drawn today and your tests are completely normal, you will receive your results only by: [place] (if you have MyChart) OR A paper copy in the mail If you have any lab test that is abnormal or we need to change your treatment, we will call you to review the results.   Testing/Procedures:  How to Prepare for Your Cardiac PET/CT Stress Test:  1. Please do not take these medications before your test:   Medications that may interfere with the cardiac pharmacological stress agent (ex. nitrates - including erectile dysfunction medications or beta-blockers) the day of the exam. (Erectile dysfunction medication should be held for at least 72 hrs prior to test) Your remaining medications may be taken with water.  2. Nothing to eat or drink, except water, 3 hours prior to arrival time.   NO caffeine/decaffeinated products, or chocolate 12 hours prior to arrival.  3. NO perfume, cologne or lotion  4. Total time is 1 to 2 hours; you may want to bring reading material for the waiting time.  5. Please report to Admitting at the [place] Entrance 60 minutes early for your test.  [place], [place] [address]  In preparation for your appointment, medication and supplies will be purchased.  Appointment availability is limited, so if you need to cancel or reschedule, please call the Radiology Department at [phone]  24 hours in advance to avoid a cancellation fee of  $100.00  What to Expect After you Arrive:  Once you arrive and check in for your appointment, you will be taken to a preparation room within the Radiology Department.  A technologist or Nurse will obtain your medical history, verify that you are correctly prepped for the exam, and explain the procedure.  Afterwards,  an IV will be started in your arm and electrodes will be placed on your skin for EKG monitoring during the stress portion of the exam. Then you will be escorted to the PET/CT scanner.  There, staff will get you positioned on the scanner and obtain a blood pressure and EKG.  During the exam, you will continue to be connected to the EKG and blood pressure machines.  A small, safe amount of a radioactive tracer will be injected in your IV to obtain a series of pictures of your heart along with an injection of a stress agent.    After your Exam:  It is recommended that you eat a meal and drink a caffeinated beverage to counter act any effects of the stress agent.  Drink plenty of fluids for the remainder of the day and urinate frequently for the first couple of hours after the exam.  Your doctor will inform you of your test results within 7-10 business days.  For questions about your test or how to prepare for your test, please call: Marchia Bond, Cardiac Imaging Nurse Navigator  Gordy Clement, Cardiac Imaging Nurse Navigator Office: [phone]    Follow-Up: At [facility], you and your health needs are our priority.  As part of our continuing mission to provide you with exceptional heart care, we have created designated Provider Care Teams.  These Care Teams include your primary Cardiologist (physician) and Advanced Practice Providers (APPs -  Physician Assistants and Nurse Practitioners) who all work together to provide you with the care you need, when you need it.  We recommend signing up for the patient portal called "MyChart".  Sign up information is provided on this  After Visit Summary.  MyChart is used to connect with patients for Virtual Visits (Telemedicine).  Patients are able to view lab/test results, encounter notes, upcoming appointments, etc.  Non-urgent messages can be sent to your provider as well.   To learn more about what you can do with MyChart, go to [email or web address].    Your next appointment:   2 month(s)  The format for your next appointment:   In Person  Provider:   Christen Bame, NP         Other Instructions  You have been referred to GI for abnormal Liver enzymes.  The office will call you to schedule appointment.  You have been referred to the Pharmacy for cholesterol.    Important Information About Sugar

## 2021-12-17 ENCOUNTER — Encounter: Payer: Self-pay | Admitting: Internal Medicine

## 2021-12-23 ENCOUNTER — Ambulatory Visit: Payer: PPO | Attending: Nurse Practitioner

## 2021-12-23 DIAGNOSIS — E785 Hyperlipidemia, unspecified: Secondary | ICD-10-CM

## 2021-12-23 DIAGNOSIS — R748 Abnormal levels of other serum enzymes: Secondary | ICD-10-CM | POA: Diagnosis not present

## 2021-12-23 DIAGNOSIS — I10 Essential (primary) hypertension: Secondary | ICD-10-CM

## 2021-12-23 DIAGNOSIS — I25118 Atherosclerotic heart disease of native coronary artery with other forms of angina pectoris: Secondary | ICD-10-CM | POA: Diagnosis not present

## 2021-12-24 LAB — BASIC METABOLIC PANEL
BUN/Creatinine Ratio: 33 — ABNORMAL HIGH (ref 10–24)
BUN: 28 mg/dL — ABNORMAL HIGH (ref 8–27)
CO2: 27 mmol/L (ref 20–29)
Calcium: 9.2 mg/dL (ref 8.6–10.2)
Chloride: 101 mmol/L (ref 96–106)
Creatinine, Ser: 0.85 mg/dL (ref 0.76–1.27)
Glucose: 106 mg/dL — ABNORMAL HIGH (ref 70–99)
Potassium: 4.7 mmol/L (ref 3.5–5.2)
Sodium: 139 mmol/L (ref 134–144)
eGFR: 93 mL/min/{1.73_m2} (ref >59)

## 2022-01-12 ENCOUNTER — Other Ambulatory Visit: Payer: Self-pay | Admitting: Nurse Practitioner

## 2022-01-12 MED ORDER — NITROGLYCERIN 0.4 MG SL SUBL: 0.4000 mg | SUBLINGUAL_TABLET | SUBLINGUAL | 3 refills | Status: DC | PRN

## 2022-01-21 ENCOUNTER — Telehealth: Payer: Self-pay | Admitting: *Deleted

## 2022-01-21 NOTE — Progress Notes (Unsigned)
Cardiology Office Note:    Date:  01/22/2022   ID:  Sean [name] Bui, DOB May 09, 1950, MRN [id number]  PCP:  Farris HasMorrow, Aaron, MD   [facility] Providers Cardiologist:  Armanda Magicraci Turner, MD     Referring MD: Farris HasMorrow, Aaron, MD   Chief Complaint: swelling, DOE  History of Present Illness:    Sean [name] Albright is a pleasant 71 y.o. male with a hx of CAD s/p remote BMS to LAD on Plavix, RBBB, HTN, HLD, prior alcohol dependence, and elevated liver enzymes.   Prior remote stenting of LAD with low risk exercise myoview 10/2015. He has maintained consistent follow-up.  Last cardiology clinic visit was 05/21/2021 with Dr. Mayford Knifeurner at which time LDL was elevated. He admitted to not taking Zetia 10 mg daily.  This was reinitiated in addition to atorvastatin and LDL improved.  Due to elevated LFTs, he was switched to rosuvastatin and advised to decrease alcohol consumption. One year follow-up was recommended.  Last cardiology clinic visit was with me on 12/16/21 for evaluation of swelling in hands and feet for 6 months. Swelling does not vary based on diet or other factor that he can determine.  Cannot wear his wedding band due to tightness. Also notes increased SOB with walking, particularly when going up inclines for the past 3-4 months. Walks his dogs daily and recently had to stop when attempting to walk up a hill. Has a sensation that if he needs to take a deep breath, feels improvement with rest and deep breathing. Feels discomfort in thighs up to chest that feels like pressure. No associated diaphoresis, n/v. Angina was upper body discomfort prior to stent 15 years ago and this feels different. Has decreased alcohol intake. Drinks 1 large followed by 1 small IPA daily. Reports this is significantly less alcohol than in previous years. Lost 10 lbs and stopped lisinopril "long time ago."  Home BP 140/80. Mild palpitations "forever." No recent change in palpitations. Does most of the cooking and tries to avoid high  sodium but admits to some indiscretion. No abdominal pain, n/v, BRB.   He contacted our office 11/27 to report worsening shortness of breath and muscle tightness across his abdomen and upper thighs.  Reports the routine walk that was upper limits 3 months prior now requires stopping multiple times. He was advised to use sublingual nitroglycerin as needed and ER precautions were advised. His cardiac PET scan was pending at the time and we were able to get that scheduled for 12/19.  On 01/20/2022 he notified us that a retired doctor friend detected an irregular heartbeat.  He was scheduled for office visit.  Today, he is here for evaluation of continuing symptoms as well as irregular HR detected on home monitor and by retired MD. EKG shows sinus tachycardia with PACs, RBBB, left posterior fascicular block. He continues to have DOE and edema. Does not feel like he can take a deep breath.  Activity tolerance has diminished  over the past few months and he continues to have LE edema and swelling in hands. Admits to eating more salt recently. Lasix has helped some, but he did not take it this morning. Brisk urine output with Lasix. He denies chest pain, palpitations, orthopnea, and PND.  No presyncope, syncope.  Has PET cardiac scan scheduled for 12/19 for evaluation of ischemia.   Past Medical History:  Diagnosis Date   Alcohol addiction (HCC)    2010- admitted for withdrawal , detox admission after that   Coronary artery  disease    95% mid LAD s/p BMS of LAD with aneurysmal segment   Depression    ED (erectile dysfunction)    HLD (hyperlipidemia)    HTN (hypertension)    Mental disorder    Peptic ulcer    RBBB 10/24/2015    Past Surgical History:  Procedure Laterality Date   CORONARY STENT PLACEMENT      Current Medications: Current Meds  Medication Sig   clopidogrel (PLAVIX) 75 MG tablet Take 1 tablet (75 mg total) by mouth daily.   furosemide (LASIX) 20 MG tablet Take 1 tablet (20 mg total)  by mouth daily.   Multiple Vitamin (MULITIVITAMIN WITH MINERALS) TABS Take 1 tablet by mouth daily.   nitroGLYCERIN (NITROSTAT) 0.4 MG SL tablet Place 1 tablet (0.4 mg total) under the tongue every 5 (five) minutes as needed for chest pain.   potassium chloride (KLOR-CON M) 10 MEQ tablet Take 1 tablet (10 mEq total) by mouth daily.   [DISCONTINUED] lisinopril (ZESTRIL) 20 MG tablet Take 1 tablet (20 mg total) by mouth daily.   [DISCONTINUED] metoprolol succinate (TOPROL-XL) 50 MG 24 hr tablet Take 1 tablet (50 mg total) by mouth daily. Take with or immediately following a meal.     Allergies:   Patient has no known allergies.   Social History   Socioeconomic History   Marital status: Married    Spouse name: Not on file   Number of children: Not on file   Years of education: Not on file   Highest education level: Not on file  Occupational History   Not on file  Tobacco Use   Smoking status: Former    Types: Cigarettes    Quit date: 1990    Years since quitting: 33.9   Smokeless tobacco: Never  Vaping Use   Vaping Use: Never used  Substance and Sexual Activity   Alcohol use: No    Comment: quit 07/2006   Drug use: No   Sexual activity: Yes    Birth control/protection: None    Comment: Married  Other Topics Concern   Not on file  Social History Narrative   Live with wife in [place], is an Art gallery manager, currently driving trucks for a local firm. Quit smoking 1990 after smoking for 20 years. Drinks heavilyu (1 bottle of vodka or 18-24 beers a day). Used to smoke marijuana but quit in 1990s. Has united healthcare insurance. Goes to [employer] for alcohol cessation   Social Determinants of Health   Financial Resource Strain: Not on file  Food Insecurity: Not on file  Transportation Needs: Not on file  Physical Activity: Not on file  Stress: Not on file  Social Connections: Not on file     Family History: The patient's family history includes Heart attack (age of onset: 28)  in his brother; Prostate cancer in his father. There is no history of Anesthesia problems, Hypotension, Malignant hyperthermia, or Pseudochol deficiency.  ROS:   Please see the history of present illness.    + DOE + bilateral hand and LE edema All other systems reviewed and are negative.  Labs/Other Studies Reviewed:    The following studies were reviewed today:  Exercise Myoview 10/2015  Nuclear stress EF: 63%. The left ventricular ejection fraction is normal (55-65%). ST segment depression of 1 mm was noted during stress in the V5 and V6 leads. Defect 1: There is a small defect of mild severity present in the basal inferior location. Likely diaphragmatic attenuation. This is a low  risk study. No significant ischemia identified.  Recent Labs: 12/15/2021: ALT 140 12/23/2021: BUN 28; Creatinine, Ser 0.85; Potassium 4.7; Sodium 139  Recent Lipid Panel    Component Value Date/Time   CHOL 108 08/26/2021 0756   TRIG 102 08/26/2021 0756   HDL 44 08/26/2021 0756   CHOLHDL 2.5 08/26/2021 0756   CHOLHDL 2.2 11/18/2015 0748   VLDL 13 11/18/2015 0748   LDLCALC 45 08/26/2021 0756     Risk Assessment/Calculations:       Physical Exam:    VS:  BP 126/70 Comment: with [name]/P machine  Pulse (!) 114   Ht  (1.803 m)   Wt 206 lb 6.4 oz (93.6 kg)   SpO2 94%   BMI 28.79 kg/m     Wt Readings from Last 3 Encounters:  01/22/22 206 lb 6.4 oz (93.6 kg)  12/16/21 216 lb 6.4 oz (98.2 kg)  05/21/21 218 lb 9.6 oz (99.2 kg)     GEN:  Well nourished, well developed in no acute distress HEENT: Normal NECK: No JVD; No carotid bruits CARDIAC: RRR, no murmurs, rubs, gallops RESPIRATORY:  Clear to auscultation without rales, wheezing or rhonchi  ABDOMEN: Soft, non-tender, non-distended MUSCULOSKELETAL:  Bilateral lower and upper extremity edema; No deformity. 2+ pedal pulses, equal bilaterally SKIN: Warm and dry NEUROLOGIC:  Alert and oriented x 3 PSYCHIATRIC:  Normal affect   EKG:   EKG is ordered today.  EKG reveals sinus tachycardia with PACs at 114 bpm, RBBB, LPFB, PACs new from previous tracing  Diagnoses:    1. DOE (dyspnea on exertion)   2. Coronary artery disease of native artery of native heart with stable angina pectoris (HCC)   3. Hyperlipidemia LDL goal <70   4. Abnormal liver enzymes   5. Primary hypertension   6. Generalized edema   7. RBBB   8. PAC (premature atrial contraction)     Assessment and Plan:     Frequent PACs: Recently noted to have irregular HR. EKG reveals frequent PACs. History of bifascicular block. Will get echo to evaluate LV function. Start Toprol 50 mg every evening. I have asked him to report HR and BP in 1 week. Would favor up-titration of BB if no improvement on Toprol 50 mg at that time for upcoming PET cardiac scan.   CAD with stable angina: History of remote bare-metal stent to LAD. Symptoms of DOE, fatigue, and inability to take a deep breath. Symptoms worsened over past 3-4 months. Has  PET CT for evaluation of ischemia scheduled for 12/19.  Starting Toprol XL 50 mg for frequent PACs, tachycardia as noted above. Will have him report HR and BP in 1 week. No bleeding concerns. Continue Plavix, lisinopril.   DOE: Increased shortness of breath with exertion and bilateral upper and lower extremity edema. We will get echocardiogram to evaluate for structural heart disease, depressed LV function. Starting Lasix in the setting of swelling as noted below.  Swelling: Bilateral lower and upper extremity swelling for 6 months. States hand swelling has decreased today. Does not vary based on diet. We will start Lasix 20 mg and Kdur 10 mEq daily. Advised him to limit sodium intake. As noted above, getting echo for evaluation of systolic and diastolic function. Will check bmet in 1 week.   Hypertension: BP is well-controlled today. He resumed lisinopril 20 mg daily at last office visit on 12/16/21 as well as started Lasix 20 mg daily for  swelling. No additional changes today. Scr and electrolytes stable on  labs 11/7.   Hyperlipidemia LDL goal < 70: LDL 45 on 08/26/21. We are discontinuing rosuvastatin in the setting of transaminitis. Has an appointment with Pharm D 01/27/22 for management of lipids.   Elevated LFTs: Liver enzymes remained elevated despite stopping atorvastatin and starting rosuvastatin. AST 157 and ALT 140 on 12/15/21, an increase from lab work on 9/25.  Has new patient appointment with GI 12/13. No longer on statin medication or Zetia.     Disposition: Keep your appointment for 02/12/22  Medication Adjustments/Labs and Tests Ordered: Current medicines are reviewed at length with the patient today.  Concerns regarding medicines are outlined above.  Orders Placed This Encounter  Procedures   EKG 12-Lead   ECHOCARDIOGRAM COMPLETE   Meds ordered this encounter  Medications   lisinopril (ZESTRIL) 20 MG tablet    Sig: Take 1 tablet (20 mg total) by mouth daily.    Dispense:  90 tablet    Refill:  3   DISCONTD: metoprolol succinate (TOPROL-XL) 50 MG 24 hr tablet    Sig: Take 1 tablet (50 mg total) by mouth daily. Take with or immediately following a meal.    Dispense:  90 tablet    Refill:  3   metoprolol succinate (TOPROL-XL) 50 MG 24 hr tablet    Sig: Take 1 tablet (50 mg total) by mouth every evening. Take with or immediately following a meal.    Dispense:  90 tablet    Refill:  3    Patient Instructions  Medication Instructions:   START  Toprol one (1) tablet by mouth ( 50 mg) in the evening.   *If you need a refill on your cardiac medications before your next appointment, please call your pharmacy*   Lab Work:  None ordered.  If you have labs (blood work) drawn today and your tests are completely normal, you will receive your results only by: MyChart Message (if you have MyChart) OR A paper copy in the mail If you have any lab test that is abnormal or we need to change your treatment, we  will call you to review the results.   Testing/Procedures:  Your physician has requested that you have an echocardiogram. Echocardiography is a painless test that uses sound waves to create images of your heart. It provides your doctor with information about the size and shape of your heart and how well your heart's chambers and valves are working. This procedure takes approximately one hour. There are no restrictions for this procedure. Please do NOT wear cologne, aftershave, or lotions (deodorant is allowed). Please arrive 15 minutes prior to your appointment time.    Follow-Up: At [facility], you and your health needs are our priority.  As part of our continuing mission to provide you with exceptional heart care, we have created designated Provider Care Teams.  These Care Teams include your primary Cardiologist (physician) and Advanced Practice Providers (APPs -  Physician Assistants and Nurse Practitioners) who all work together to provide you with the care you need, when you need it.  We recommend signing up for the patient portal called "MyChart".  Sign up information is provided on this After Visit Summary.  MyChart is used to connect with patients for Virtual Visits (Telemedicine).  Patients are able to view lab/test results, encounter notes, upcoming appointments, etc.  Non-urgent messages can be sent to your provider as well.   To learn more about what you can do with MyChart, go to [email or web address].    Your next  appointment:   3 week(s)  The format for your next appointment:   In Person  Provider:   Eligha Bridegroom, NP          Important Information About Sugar         Signed, Levi Aland, NP  01/22/2022 10:04 AM    [facility]

## 2022-01-21 NOTE — Patient Outreach (Signed)
  Care Coordination   01/21/2022 Name: Sean [name] MRN: [id number] DOB: 19-Feb-1950   Care Coordination Outreach Attempts:  An unsuccessful telephone outreach was attempted today to offer the patient information about available care coordination services as a benefit of their health plan.   Follow Up Plan:  Additional outreach attempts will be made to offer the patient care coordination information and services.   Encounter Outcome:  No Answer   Care Coordination Interventions:  No, not indicated    Elliot Cousin, RN Care Management Coordinator [facility] Main Office [phone]

## 2022-01-22 ENCOUNTER — Ambulatory Visit (HOSPITAL_BASED_OUTPATIENT_CLINIC_OR_DEPARTMENT_OTHER): Payer: PPO

## 2022-01-22 ENCOUNTER — Encounter: Payer: Self-pay | Admitting: Nurse Practitioner

## 2022-01-22 ENCOUNTER — Ambulatory Visit: Payer: PPO | Attending: Nurse Practitioner | Admitting: Nurse Practitioner

## 2022-01-22 VITALS — BP 126/70 | HR 114 | Ht 71.0 in | Wt 206.4 lb

## 2022-01-22 DIAGNOSIS — R748 Abnormal levels of other serum enzymes: Secondary | ICD-10-CM | POA: Insufficient documentation

## 2022-01-22 DIAGNOSIS — I491 Atrial premature depolarization: Secondary | ICD-10-CM | POA: Insufficient documentation

## 2022-01-22 DIAGNOSIS — I10 Essential (primary) hypertension: Secondary | ICD-10-CM

## 2022-01-22 DIAGNOSIS — R601 Generalized edema: Secondary | ICD-10-CM | POA: Insufficient documentation

## 2022-01-22 DIAGNOSIS — E785 Hyperlipidemia, unspecified: Secondary | ICD-10-CM | POA: Diagnosis not present

## 2022-01-22 DIAGNOSIS — R0609 Other forms of dyspnea: Secondary | ICD-10-CM | POA: Insufficient documentation

## 2022-01-22 DIAGNOSIS — I25118 Atherosclerotic heart disease of native coronary artery with other forms of angina pectoris: Secondary | ICD-10-CM | POA: Diagnosis not present

## 2022-01-22 DIAGNOSIS — I4510 Unspecified right bundle-branch block: Secondary | ICD-10-CM | POA: Diagnosis not present

## 2022-01-22 LAB — ECHOCARDIOGRAM COMPLETE
Area-P 1/2: 4.99 cm2
Height: 71 in
S' Lateral: 3.2 cm
Weight: 3302.4 oz

## 2022-01-22 MED ORDER — LISINOPRIL 20 MG PO TABS: 20.0000 mg | ORAL_TABLET | Freq: Every day | ORAL | 3 refills | Status: DC

## 2022-01-22 MED ORDER — METOPROLOL SUCCINATE ER 50 MG PO TB24: 50.0000 mg | ORAL_TABLET | Freq: Every evening | ORAL | 3 refills | Status: DC

## 2022-01-22 MED ORDER — METOPROLOL SUCCINATE ER 50 MG PO TB24: 50.0000 mg | ORAL_TABLET | Freq: Every day | ORAL | 3 refills | Status: DC

## 2022-01-22 NOTE — Patient Instructions (Signed)
Medication Instructions:   START  Toprol one (1) tablet by mouth ( 50 mg) in the evening.   *If you need a refill on your cardiac medications before your next appointment, please call your pharmacy*   Lab Work:  None ordered.  If you have labs (blood work) drawn today and your tests are completely normal, you will receive your results only by: MyChart Message (if you have MyChart) OR A paper copy in the mail If you have any lab test that is abnormal or we need to change your treatment, we will call you to review the results.   Testing/Procedures:  Your physician has requested that you have an echocardiogram. Echocardiography is a painless test that uses sound waves to create images of your heart. It provides your doctor with information about the size and shape of your heart and how well your heart's chambers and valves are working. This procedure takes approximately one hour. There are no restrictions for this procedure. Please do NOT wear cologne, aftershave, or lotions (deodorant is allowed). Please arrive 15 minutes prior to your appointment time.    Follow-Up: At [facility], you and your health needs are our priority.  As part of our continuing mission to provide you with exceptional heart care, we have created designated Provider Care Teams.  These Care Teams include your primary Cardiologist (physician) and Advanced Practice Providers (APPs -  Physician Assistants and Nurse Practitioners) who all work together to provide you with the care you need, when you need it.  We recommend signing up for the patient portal called "MyChart".  Sign up information is provided on this After Visit Summary.  MyChart is used to connect with patients for Virtual Visits (Telemedicine).  Patients are able to view lab/test results, encounter notes, upcoming appointments, etc.  Non-urgent messages can be sent to your provider as well.   To learn more about what you can do with MyChart, go to  [email or web address].    Your next appointment:   3 week(s)  The format for your next appointment:   In Person  Provider:   Eligha Bridegroom, NP          Important Information About Sugar

## 2022-01-27 ENCOUNTER — Ambulatory Visit: Payer: PPO | Attending: Cardiovascular Disease | Admitting: Pharmacist

## 2022-01-27 DIAGNOSIS — I25118 Atherosclerotic heart disease of native coronary artery with other forms of angina pectoris: Secondary | ICD-10-CM

## 2022-01-27 DIAGNOSIS — E785 Hyperlipidemia, unspecified: Secondary | ICD-10-CM

## 2022-01-27 NOTE — Progress Notes (Signed)
Patient ID: Sean [name]                 DOB: 26-Jul-1950                    MRN: [id number]      HPI: Sean [name] is a 71 y.o. male patient of Dr. Mayford Knife referred to lipid clinic by Eligha Bridegroom, NP. PMH is significant for CAD s/p remote BMS to LAD on Plavix, RBBB, HTN, HLD, prior alcohol dependence, and elevated liver enzymes.  At cardiology clinic visit 05/21/2021 LDL was elevated. He admitted to not taking Zetia 10 mg daily. This was reinitiated in addition to atorvastatin and LDL improved. Due to elevated LFTs, he was switched to rosuvastatin and advised to decrease alcohol consumption. At appointment with Marcelino Duster 12/7 he reported he had decreased alcohol intake but was still drinking 1 large and 1 small IPA per day. LFT continued to increase despite change to rosuvastatin. Rosuvastatin and ezetimibe was stopped. Patient saw Marcelino Duster on 12/7 with complaints of new onset SOB. Echo normal. Has been set up with PET next week. Also with irregular heartbeat, EKG showed sinus tach with PACs, RBBB, left posterior fascicular block. Was started on metoprolol succinate 50mg .  Patient presents today to clinic. He states his fatigue is still there. Cannot walk from store to car without stopping. Gives me a list of his HR which have improved. Only started metoprolol 5 days ago. 82, 107, 102, 74, 104, 85, 95, 98.  States he took a nitro when he was walking the dog and had tightness in his abdomen and SOB. No benefit.  Has not been taking ezetimibe or rosuvastatin. Down to 1 18oz 9% IPA per day. Says he will stop drinking all together if it will help his situation.  Reviewed options for lowering LDL cholesterol, PCSK-9 inhibitors as the best option.  Discussed mechanisms of action, dosing, side effects and potential decreases in LDL cholesterol.  Also reviewed cost information.   Current Medications: none Intolerances: atorvastatin 80mg , rosuvastatin 40mg , ezetimibe (increased LFT) Risk Factors: CAD,  HTN LDL-C goal: <70 ApoB goal: <80  Diet:  Breakfast: egg, cereal Admits too much salt, not much fried food Loves beef- doesn't limit Eats more processed meat  Lots of vegetables and fruits  Exercise: walks daily  Family History:  Family History  Problem Relation Age of Onset   Prostate cancer Father    Heart attack Brother 29   Anesthesia problems Neg Hx    Hypotension Neg Hx    Malignant hyperthermia Neg Hx    Pseudochol deficiency Neg Hx     Social History: 1 18oz 9% IPA per day, no tobacco  Labs: Lipid Panel     Component Value Date/Time   CHOL 108 08/26/2021 0756   TRIG 102 08/26/2021 0756   HDL 44 08/26/2021 0756   CHOLHDL 2.5 08/26/2021 0756   CHOLHDL 2.2 11/18/2015 0748   VLDL 13 11/18/2015 0748   LDLCALC 45 08/26/2021 0756   LABVLDL 19 08/26/2021 0756    Past Medical History:  Diagnosis Date   Alcohol addiction (HCC)    2010- admitted for withdrawal , detox admission after that   Coronary artery disease    95% mid LAD s/p BMS of LAD with aneurysmal segment   Depression    ED (erectile dysfunction)    HLD (hyperlipidemia)    HTN (hypertension)    Mental disorder    Peptic ulcer    RBBB 10/24/2015  Current Outpatient Medications on File Prior to Visit  Medication Sig Dispense Refill   clopidogrel (PLAVIX) 75 MG tablet Take 1 tablet (75 mg total) by mouth daily. 90 tablet 3   furosemide (LASIX) 20 MG tablet Take 1 tablet (20 mg total) by mouth daily. 90 tablet 3   lisinopril (ZESTRIL) 20 MG tablet Take 1 tablet (20 mg total) by mouth daily. 90 tablet 3   metoprolol succinate (TOPROL-XL) 50 MG 24 hr tablet Take 1 tablet (50 mg total) by mouth every evening. Take with or immediately following a meal. 90 tablet 3   Multiple Vitamin (MULITIVITAMIN WITH MINERALS) TABS Take 1 tablet by mouth daily.     nitroGLYCERIN (NITROSTAT) 0.4 MG SL tablet Place 1 tablet (0.4 mg total) under the tongue every 5 (five) minutes as needed for chest pain. 25 tablet 3    potassium chloride (KLOR-CON M) 10 MEQ tablet Take 1 tablet (10 mEq total) by mouth daily. 90 tablet 3   No current facility-administered medications on file prior to visit.    No Known Allergies  Assessment/Plan:  1. Hyperlipidemia -  HLD (hyperlipidemia) Assessment:  LFT increased on atorvastatin and ezetimibe along with rosuvastatin and ezetimibe.  Unsure if LFT elevation had more to do with the combo of zetia plus statin or statin alone Patient has been decreasing alcohol intake- states in the past he drank significantly more- had an addiction and sounds like he went to rehab Still drinking daily, but down to slightly over the recommended amount, reviewed that I would prefer he only drink a few times a week Reviewed PCKS9i. Discussed mechanisms of action, dosing, side effects and potential decreases in LDL cholesterol.  Also reviewed cost information.  PT is generally very active, but fatigue has been limiting factor  Plan:  Start Repatha 140mg  q 14 days- will submit PA Recheck LFT to make sure they have come down Will get lipid panel solely for insurance PA Recommended further decrease is alcohol intake Repeat labs in 2 months   Thank you,  Brisha Mccabe D Anshu Wehner, Pharm.D, BCPS, CPP [facility] A Division of [facility] [address], [place], [place]  Phone: [phone]; Fax: [phone]

## 2022-01-27 NOTE — Patient Instructions (Signed)
I will submit a prior authorization for Repatha. I will call you once I hear back. Please call me at [phone] with any questions.   Repatha is a cholesterol medication that improved your body's ability to get rid of "bad cholesterol" known as LDL. It can lower your LDL up to 60%! It is an injection that is given under the skin every 2 weeks. The medication often requires a prior authorization from your insurance company. We will take care of submitting all the necessary information to your insurance company to get it approved. The most common side effects of Repatha include runny nose, symptoms of the common cold, rarely flu or flu-like symptoms, back/muscle pain in about 3-4% of the patients, and redness, pain, or bruising at the injection site. Tell your healthcare provider if you have any side effect that bothers you or that does not go away.   Adopting a Healthy Lifestyle.   Weight: Know what a healthy weight is for you (roughly BMI <25) and aim to maintain this. You can calculate your body mass index on your smart phone  Diet: Aim for 7+ servings of fruits and vegetables daily Limit animal fats in diet for cholesterol and heart health - choose grass fed whenever available Avoid highly processed foods (fast food burgers, tacos, fried chicken, pizza, hot dogs, french fries)  Saturated fat comes in the form of butter, lard, coconut oil, margarine, partially hydrogenated oils, and fat in meat. These increase your risk of cardiovascular disease.  Use healthy plant oils, such as olive, canola, soy, corn, sunflower and peanut.  Whole foods such as fruits, vegetables and whole grains have fiber  Men need > 38 grams of fiber per day Women need > 25 grams of fiber per day  Load up on vegetables and fruits - one-half of your plate: Aim for color and variety, and remember that potatoes dont count. Go for whole grains - one-quarter of your plate: Whole wheat, barley, wheat berries, quinoa, oats,  brown rice, and foods made with them. If you want pasta, go with whole wheat pasta. Protein power - one-quarter of your plate: Fish, chicken, beans, and nuts are all healthy, versatile protein sources. Limit red meat. You need carbohydrates for energy! The type of carbohydrate is more important than the amount. Choose carbohydrates such as vegetables, fruits, whole grains, beans, and nuts in the place of white rice, white pasta, potatoes (baked or fried), macaroni and cheese, cakes, cookies, and donuts.  If youre thirsty, drink water. Coffee and tea are good in moderation, but skip sugary drinks and limit milk and dairy products to one or two daily servings. Keep sugar intake at 6 teaspoons or 24 grams or LESS       Exercise: Aim for 150 min of moderate intensity exercise weekly for heart health, and weights twice weekly for bone health Stay active - any steps are better than no steps! Aim for 7-9 hours of sleep daily

## 2022-01-27 NOTE — Assessment & Plan Note (Signed)
Assessment:  LFT increased on atorvastatin and ezetimibe along with rosuvastatin and ezetimibe.  Unsure if LFT elevation had more to do with the combo of zetia plus statin or statin alone Patient has been decreasing alcohol intake- states in the past he drank significantly more- had an addiction and sounds like he went to rehab Still drinking daily, but down to slightly over the recommended amount, reviewed that I would prefer he only drink a few times a week Reviewed PCKS9i. Discussed mechanisms of action, dosing, side effects and potential decreases in LDL cholesterol.  Also reviewed cost information.  PT is generally very active, but fatigue has been limiting factor  Plan:  Start Repatha 140mg  q 14 days- will submit PA Recheck LFT to make sure they have come down Will get lipid panel solely for insurance PA Recommended further decrease is alcohol intake Repeat labs in 2 months

## 2022-01-28 ENCOUNTER — Ambulatory Visit (INDEPENDENT_AMBULATORY_CARE_PROVIDER_SITE_OTHER): Payer: PPO | Admitting: Internal Medicine

## 2022-01-28 ENCOUNTER — Telehealth: Payer: Self-pay | Admitting: Pharmacist

## 2022-01-28 ENCOUNTER — Other Ambulatory Visit (INDEPENDENT_AMBULATORY_CARE_PROVIDER_SITE_OTHER): Payer: PPO

## 2022-01-28 ENCOUNTER — Encounter: Payer: Self-pay | Admitting: Internal Medicine

## 2022-01-28 VITALS — BP 124/68 | HR 80 | Ht 71.0 in | Wt 205.4 lb

## 2022-01-28 DIAGNOSIS — R1900 Intra-abdominal and pelvic swelling, mass and lump, unspecified site: Secondary | ICD-10-CM | POA: Diagnosis not present

## 2022-01-28 DIAGNOSIS — R7989 Other specified abnormal findings of blood chemistry: Secondary | ICD-10-CM | POA: Diagnosis not present

## 2022-01-28 DIAGNOSIS — R748 Abnormal levels of other serum enzymes: Secondary | ICD-10-CM

## 2022-01-28 DIAGNOSIS — R0602 Shortness of breath: Secondary | ICD-10-CM | POA: Diagnosis not present

## 2022-01-28 DIAGNOSIS — R634 Abnormal weight loss: Secondary | ICD-10-CM

## 2022-01-28 DIAGNOSIS — R5383 Other fatigue: Secondary | ICD-10-CM

## 2022-01-28 DIAGNOSIS — E785 Hyperlipidemia, unspecified: Secondary | ICD-10-CM

## 2022-01-28 LAB — IBC + FERRITIN
Ferritin: 183.5 ng/mL (ref 22.0–322.0)
Iron: 76 ug/dL (ref 42–165)
Saturation Ratios: 24.6 % (ref 20.0–50.0)
TIBC: 309.4 ug/dL (ref 250.0–450.0)
Transferrin: 221 mg/dL (ref 212.0–360.0)

## 2022-01-28 LAB — BASIC METABOLIC PANEL
BUN: 33 mg/dL — ABNORMAL HIGH (ref 6–23)
CO2: 31 mEq/L (ref 19–32)
Calcium: 9.4 mg/dL (ref 8.4–10.5)
Chloride: 101 mEq/L (ref 96–112)
Creatinine, Ser: 1.01 mg/dL (ref 0.40–1.50)
GFR: 74.97 mL/min (ref >60.00)
Glucose, Bld: 99 mg/dL (ref 70–99)
Potassium: 4.6 mEq/L (ref 3.5–5.1)
Sodium: 138 mEq/L (ref 135–145)

## 2022-01-28 LAB — LIPID PANEL
Chol/HDL Ratio: 4.8 ratio (ref 0.0–5.0)
Cholesterol, Total: 168 mg/dL (ref 100–199)
HDL: 35 mg/dL — ABNORMAL LOW (ref >39)
LDL Chol Calc (NIH): 107 mg/dL — ABNORMAL HIGH (ref 0–99)
Triglycerides: 144 mg/dL (ref 0–149)
VLDL Cholesterol Cal: 26 mg/dL (ref 5–40)

## 2022-01-28 LAB — HEPATIC FUNCTION PANEL
ALT: 176 IU/L — ABNORMAL HIGH (ref 0–44)
AST: 205 IU/L — ABNORMAL HIGH (ref 0–40)
Albumin: 3.7 g/dL — ABNORMAL LOW (ref 3.8–4.8)
Alkaline Phosphatase: 89 IU/L (ref 44–121)
Bilirubin Total: 0.3 mg/dL (ref 0.0–1.2)
Bilirubin, Direct: 0.14 mg/dL (ref 0.00–0.40)
Total Protein: 6.3 g/dL (ref 6.0–8.5)

## 2022-01-28 LAB — PROTIME-INR
INR: 1.1 ratio — ABNORMAL HIGH (ref 0.8–1.0)
Prothrombin Time: 12.4 s (ref 9.6–13.1)

## 2022-01-28 LAB — IRON: Iron: 76 ug/dL (ref 42–165)

## 2022-01-28 NOTE — Progress Notes (Signed)
HISTORY OF PRESENT ILLNESS:  Sean [name] is a 71 y.o. 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  He is sent today by his primary care provider and cardiology regarding elevated liver tests.  The patient is new to this practice.  He has a history of significant alcoholism for which she has been in rehabilitation previously, coronary artery disease with prior coronary artery stent placement for which he is on chronic Plavix, hypertension, hyperlipidemia, peptic ulcer disease, and depression.  In recent months the patient describes progressive dyspnea on exertion and shortness of breath.  This has been associated with swelling of the extremities.  He has had decreased appetite with 15 to 20 pound weight loss reported.  Review of laboratory shows elevated liver tests over the past 5 months with AST and ALT elevated.  AST greater than ALT T.  Alkaline phosphatase and bilirubin have been normal protein and albumin are normal.  Normal renal function.  He did see cardiology for these problems on January 22, 2022.  Reviewed.  He underwent echo.  Normal EF.  He is scheduled for PET/CT next week.  He was placed on metoprolol.  He had been taken off of the statin previously.  He is accompanied today by his wife.  He continues to drink alcohol.  He reports drinking 1 large IPA per day.  Liver test from yesterday worsened with AST 205, ALT 176, alkaline phosphatase 89, total bilirubin 0.14, albumin now low at 3.7.  Patient denies family history of liver disease.  Apparently had colonoscopy elsewhere as well as Cologuard testing.  No lower GI complaints.  Does have rare heartburn and indigestion.    REVIEW OF SYSTEMS:  All non-GI ROS negative unless otherwise stated in the HPI except for excessive urination  Past Medical History:  Diagnosis Date   Alcohol addiction (HCC)    2010- admitted for withdrawal ,  detox admission after that   Arrhythmia    Coronary artery disease    95% mid LAD s/p BMS of LAD with aneurysmal segment   Depression    ED (erectile dysfunction)    HLD (hyperlipidemia)    HTN (hypertension)    Mental disorder    Peptic ulcer    RBBB 10/24/2015    Past Surgical History:  Procedure Laterality Date   CORONARY STENT PLACEMENT      Social History Sean [name]  reports that he quit smoking about 33 years ago. His smoking use included cigarettes. He has never used smokeless tobacco. He reports current alcohol use. He reports that he does not use drugs.  family history includes Heart attack (age of onset: 40) in his brother; Prostate cancer in his father.  No Known Allergies     PHYSICAL EXAMINATION: Vital signs: BP 124/68   Pulse 80   Ht 5\' 11"  (1.803 m)   Wt 205 lb 6.4 oz (93.2 kg)   SpO2 94%   BMI 28.65 kg/m   Constitutional: generally well-appearing, no acute distress Psychiatric: alert and oriented x 3, cooperative Eyes: extraocular movements intact, anicteric, conjunctiva pink Mouth: oral pharynx moist, no lesions Neck: supple no lymphadenopathy Cardiovascular: heart regular rate and rhythm, no murmur Lungs: clear to auscultation bilaterally Abdomen: soft, nontender, nondistended, no obvious ascites, no peritoneal signs, normal bowel sounds, no organomegaly Rectal: Omitted Extremities: no clubbing or cyanosis.  1+ lower extremity edema bilaterally Skin: no clubbing, cyanosis, or lesions  on visible extremities Neuro: No focal deficits. No asterixis.    ASSESSMENT:  1.  Progressively elevated hepatic transaminases with AST greater than ALT.  Rule out alcohol related.  Rule out other. 2.  Weight loss associated with decreased appetite.  Estimated 15 to 20 pounds. 3.  Dyspnea on exertion, shortness of breath, and fatigue.  Being evaluated by cardiology.  Ejection fraction okay on echo.  Does have a history of coronary artery disease.  PET/CT this  week to rule out coronary artery disease as an etiology for her symptom complex 4.  Alcoholic.  Prior history of inpatient alcohol rehab.  Currently drinking as described   PLAN:  1.  Stop all alcohol 2.  Schedule contrast-enhanced CT scan of the abdomen pelvis to evaluate abnormal liver tests, weight loss, and swelling.  We will be in touch regarding results after they are available. 3.  Multiple laboratories today to evaluate for various viral and nonviral causes for elevated liver test.  Will be in touch after the results are available. 4.  Continue workup with cardiology 5.  May need pulmonary evaluation if cardiology and GI evaluations unrevealing. 6.  GI office follow-up 4 weeks.  And then to contact the office in the interim for any questions or problems Total time of 60 minutes was spent preparing to see the patient, obtaining comprehensive history, reviewing outside records from PCP and cardiology, reviewing studies and laboratories, obtaining comprehensive history, performing medically appropriate physical examination, counseling and educating the patient and his wife regarding the above listed issues, answering multiple questions, ordering blood work, ordering advanced radiology study, arranging follow-up, documenting clinical information in the health record

## 2022-01-28 NOTE — Telephone Encounter (Unsigned)
Repatha approved through 06/29/22 Waiting for patient to call back to discuss labs

## 2022-01-28 NOTE — Telephone Encounter (Addendum)
LFT continue to increase. Yesterday at apt he confirmed he was not taking statin or zetia. I will confirm again. Still drinking alcohol, although he reports less. Will encourage him to quit completely. Will place referral to GI.  LVM for patient to call back.   Will also consult with Dr. Mayford Knife  AST [phone] ALT [phone] Continue to increase even off statin. States he is only drinking 1 18oz 9% beer per day Also complains of SOB/fatigue Planned for PET next week Plan was to start PCSK9i which I think is still appropriate. Key: [id number]

## 2022-01-28 NOTE — Patient Instructions (Signed)
_______________________________________________________  If you are age 71 or older, your body mass index should be between 23-30. Your Body mass index is 28.65 kg/m. If this is out of the aforementioned range listed, please consider follow up with your Primary Care Provider.  If you are age 35 or younger, your body mass index should be between 19-25. Your Body mass index is 28.65 kg/m. If this is out of the aformentioned range listed, please consider follow up with your Primary Care Provider.   ________________________________________________________  The [facility] providers would like to encourage you to use [facility] to communicate with providers for non-urgent requests or questions.  Due to long hold times on the telephone, sending your provider a message by [facility] may be a faster and more efficient way to get a response.  Please allow 48 business hours for a response.  Please remember that this is for non-urgent requests.  _______________________________________________________  Your provider has requested that you go to the basement level for lab work before leaving today. Press "B" on the elevator. The lab is located at the first door on the left as you exit the elevator.  You will be contacted by [facility] in the next 2 days to arrange a CT of the abdomen/pelvis.  The number on your caller ID will be [phone], please answer when they call.  If you have not heard from them in 2 days please call [phone] to schedule.

## 2022-01-29 MED ORDER — REPATHA SURECLICK 140 MG/ML ~~LOC~~ SOAJ: 1.0000 mL | SUBCUTANEOUS | 11 refills | Status: DC

## 2022-01-29 NOTE — Telephone Encounter (Signed)
Spoke to patient. Rx sent to pharmacy and labs scheduled.

## 2022-01-30 ENCOUNTER — Telehealth (HOSPITAL_COMMUNITY): Payer: Self-pay | Admitting: *Deleted

## 2022-01-30 NOTE — Telephone Encounter (Signed)
Reaching out to patient to offer assistance regarding upcoming cardiac imaging study; pt verbalizes understanding of appt date/time, parking situation and where to check in, pre-test NPO status and verified current allergies; name and call back number provided for further questions should they arise  Clelia Trabucco RN Navigator Cardiac Imaging [facility] [phone] office [phone] cell  Patient aware to avoid caffeine 12 hours prior to his cardiac PET scan. 

## 2022-01-31 LAB — MITOCHONDRIAL ANTIBODIES: Mitochondrial M2 Ab, IgG: <=20 U (ref <=20.0)

## 2022-01-31 LAB — CERULOPLASMIN: Ceruloplasmin: 28 mg/dL (ref 18–36)

## 2022-01-31 LAB — ANTI-NUCLEAR AB-TITER (ANA TITER): ANA Titer 1: 1:1280 {titer} — ABNORMAL HIGH

## 2022-01-31 LAB — HEPATITIS B SURFACE ANTIGEN: Hepatitis B Surface Ag: NONREACTIVE

## 2022-01-31 LAB — ALPHA-1-ANTITRYPSIN: A-1 Antitrypsin, Ser: 159 mg/dL (ref 83–199)

## 2022-01-31 LAB — ANTI-SMOOTH MUSCLE ANTIBODY, IGG: Actin (Smooth Muscle) Antibody (IGG): 48 U — ABNORMAL HIGH (ref <20)

## 2022-01-31 LAB — HEPATITIS B SURFACE ANTIBODY,QUALITATIVE: Hep B S Ab: NONREACTIVE

## 2022-01-31 LAB — HEPATITIS C ANTIBODY: Hepatitis C Ab: NONREACTIVE

## 2022-01-31 LAB — ANA: Anti Nuclear Antibody (ANA): POSITIVE — AB

## 2022-02-03 ENCOUNTER — Encounter (HOSPITAL_COMMUNITY)
Admission: RE | Admit: 2022-02-03 | Discharge: 2022-02-03 | Disposition: A | Discharge status: Home / Self Care | Payer: PPO | Source: Ambulatory Visit | Attending: Nurse Practitioner | Admitting: Nurse Practitioner

## 2022-02-03 DIAGNOSIS — E785 Hyperlipidemia, unspecified: Secondary | ICD-10-CM | POA: Diagnosis not present

## 2022-02-03 DIAGNOSIS — R748 Abnormal levels of other serum enzymes: Secondary | ICD-10-CM | POA: Insufficient documentation

## 2022-02-03 DIAGNOSIS — I10 Essential (primary) hypertension: Secondary | ICD-10-CM | POA: Insufficient documentation

## 2022-02-03 DIAGNOSIS — I25118 Atherosclerotic heart disease of native coronary artery with other forms of angina pectoris: Secondary | ICD-10-CM | POA: Diagnosis not present

## 2022-02-03 MED ORDER — REGADENOSON 0.4 MG/5ML IV SOLN
INTRAVENOUS | Status: AC
  Administered 2022-02-03: 0.4 mg via INTRAVENOUS
  Filled 2022-02-03: qty 5

## 2022-02-03 MED ORDER — RUBIDIUM RB82 GENERATOR (RUBYFILL)
24.3000 | PACK | Freq: Once | INTRAVENOUS | Status: AC
  Administered 2022-02-03: 24.3 via INTRAVENOUS

## 2022-02-03 MED ORDER — REGADENOSON 0.4 MG/5ML IV SOLN: 0.4000 mg | Freq: Once | INTRAVENOUS | Status: AC

## 2022-02-03 MED ORDER — RUBIDIUM RB82 GENERATOR (RUBYFILL)
24.2000 | PACK | Freq: Once | INTRAVENOUS | Status: AC
  Administered 2022-02-03: 24.2 via INTRAVENOUS

## 2022-02-04 LAB — NM PET CT CARDIAC PERFUSION MULTI W/ABSOLUTE BLOODFLOW
LV dias vol: 111 mL (ref 62–150)
LV sys vol: 44 mL
MBFR: 1.74
Nuc Rest EF: 60 %
Nuc Stress EF: 67 %
Peak HR: 96 {beats}/min
Rest HR: 93 {beats}/min
Rest MBF: 1.15 ml/g/min
Rest Nuclear Isotope Dose: 24.3 mCi
ST Depression (mm): 0 mm
Stress MBF: 2 ml/g/min
Stress Nuclear Isotope Dose: 24.2 mCi
TID: 1.07

## 2022-02-08 NOTE — Progress Notes (Unsigned)
Cardiology Office Note:    Date:  02/12/2022   ID:  Sean [name], DOB [birth year], MRN [id number]  PCP:  Sean Pepper, MD   [facility] Providers Cardiologist:  Sean Him, MD     Referring MD: Sean Pepper, MD   Chief Complaint: swelling, DOE  History of Present Illness:    Sean [name] is a pleasant 71 y.o. male with a hx of CAD s/p remote BMS to LAD on Plavix, RBBB, HTN, HLD, prior alcohol dependence, and elevated liver enzymes.   Prior remote stenting of LAD with low risk exercise myoview 10/2015. He has maintained consistent follow-up.  Last cardiology clinic visit was 05/21/2021 with Sean [name] at which time LDL was elevated. He admitted to not taking Zetia 10 mg daily.  This was reinitiated in addition to atorvastatin and LDL improved.  Due to elevated LFTs, he was switched to rosuvastatin and advised to decrease alcohol consumption. One year follow-up was recommended.  Seen by me on 12/16/21 for evaluation of swelling in hands and feet for 6 months. Swelling does not vary based on diet or other factor that he can determine. Cannot wear his wedding band due to tightness. Also notes increased SOB with walking, particularly when going up inclines for the past 3-4 months. Walks his dogs daily and recently had to stop when attempting to walk up a hill. Has a sensation that if he needs to take a deep breath, feels improvement with rest and deep breathing. Feels discomfort in thighs up to chest that feels like pressure. No associated diaphoresis, n/v. Angina was upper body discomfort prior to stent 15 years ago and this feels different. Has decreased alcohol intake. Drinks 1 large followed by 1 small IPA daily. Reports this is significantly less alcohol than in previous years. Lost 10 lbs and stopped lisinopril "long time ago."  Home BP 140/80. Mild palpitations "forever." No recent change in palpitations. Does most of the cooking and tries to avoid high sodium but admits to some  indiscretion. No abdominal pain, n/v, BRB.   Contacted our office 11/27 to report worsening shortness of breath and muscle tightness across his abdomen and upper thighs. Reports his routine walk now requires stopping multiple times. Was advised to use sublingual nitroglycerin as needed and ER precautions were advised. Scheduled for cardiac PET 12/19. On 01/20/2022 he notified us that a retired doctor friend detected an irregular heartbeat. Seen by me on 01/22/22 for evaluation. EKG revealed sinus tachycardia with PACs, RBBB, left posterior fascicular block. Continued to have DOE and edema. Does not feel like he can take a deep breath.  Activity tolerance has diminished over the past few months and he continues to have LE edema and swelling in hands. Admits to eating more salt recently. Lasix has helped some, but did not take it prior to office visit. Brisk urine output with Lasix. He denied chest pain, palpitations, orthopnea, and PND.  No presyncope, syncope. Was advised to start Toprol XL 50 mg daily for frequent PACs. Echo ordered for evaluation of heart and valve function and revealed LVEF 65 to [id number], normal diastolic parameters, no RWMA, normal RV, No Significant Valve Disease. PET Cardiac scan revealed normal LV perfusion, no ischemia, no evidence of prior infarct.   Referred to pharmacist for management of lipids due to history of transaminitis on statins. Seen by Sean [name], Sean [name] on 01/27/22. Liver function tests revealed higher ALT and AST and he was referred to GI. Advised to start [employer] for lipid  management.  Seen by Sean [name], GI, on 01/28/22 and advised to stop alcohol consumption.  Scheduled for CT of abdomen and pelvis to evaluate abnormal liver tests, weight loss, and swelling.  He had elevated autoimmune markers on lab tests and advised to keep follow-up appointment in a few weeks.  He contacted our office on 02/05/22 to report exhaustion and numbness. He had some soft BP readings and was  advised to reduce lisinopril to 10 mg daily unless BP consistently > 130 or > 80 at which point he was to increase lisinopril back to 20 mg daily.   Today, he is here for follow-up. Hands are cold and pale, feel numb. He reports this occurs frequently for several years. [name] on his back and hit his head April 2022, got tangled by dogs' leashes. Underwent therapy for numbness in upper extremities, thinks hand numbness may be secondary to this injury. Reports he continues to have shortness of breath and fatigue that is worse over the previous 2-3 months. Has to stop when walking, cannot handle inclines. Gets immediate relief if he stops. Longer rest results in longer distance he can walk after resting. No chest pain with activity. Smoked for 30 years, quit in 1989. Has not had significant improvement in shortness of breath or edema since starting Lasix. He denies orthopnea, PND, palpitations, presyncope, syncope. Is undergoing testing for elevated LFTs with Sean [name], GI. Has been advised to completely avoid alcohol. Admits to 2 glasses of wine last night, otherwise has not had any alcohol since he saw Sean [name] on 01/28/22.   Past Medical History:  Diagnosis Date   Alcohol addiction (HCC)    2010- admitted for withdrawal , detox admission after that   Arrhythmia    Coronary artery disease    95% mid LAD s/p BMS of LAD with aneurysmal segment   Depression    ED (erectile dysfunction)    HLD (hyperlipidemia)    HTN (hypertension)    Mental disorder    Peptic ulcer    RBBB 10/24/2015    Past Surgical History:  Procedure Laterality Date   CORONARY STENT PLACEMENT      Current Medications: Current Meds  Medication Sig   clopidogrel (PLAVIX) 75 MG tablet Take 1 tablet (75 mg total) by mouth daily.   Evolocumab (REPATHA SURECLICK) 140 MG/ML SOAJ Inject 140 mg into the skin every 14 (fourteen) days.   lisinopril (ZESTRIL) 20 MG tablet Take 1 tablet (20 mg total) by mouth daily.   metoprolol  succinate (TOPROL-XL) 50 MG 24 hr tablet Take 1 tablet (50 mg total) by mouth every evening. Take with or immediately following a meal.   Multiple Vitamin (MULITIVITAMIN WITH MINERALS) TABS Take 1 tablet by mouth daily.   nitroGLYCERIN (NITROSTAT) 0.4 MG SL tablet Place 1 tablet (0.4 mg total) under the tongue every 5 (five) minutes as needed for chest pain.   potassium chloride (KLOR-CON M) 10 MEQ tablet Take 1 tablet (10 mEq total) by mouth daily.   [DISCONTINUED] furosemide (LASIX) 20 MG tablet Take 1 tablet (20 mg total) by mouth daily.     Allergies:   Patient has no known allergies.   Social History   Socioeconomic History   Marital status: Married    Spouse name: Not on file   Number of children: 2   Years of education: Not on file   Highest education level: Not on file  Occupational History   Occupation: Retired bus driver  Tobacco Use   Smoking  status: Former    Types: Cigarettes    Quit date: 1990    Years since quitting: 34.0   Smokeless tobacco: Never  Vaping Use   Vaping Use: Never used  Substance and Sexual Activity   Alcohol use: Yes    Comment: quit 07/2006   Drug use: No   Sexual activity: Yes    Birth control/protection: None    Comment: Married  Other Topics Concern   Not on file  Social History Narrative   Live with wife in [place], is an Chief Financial Officer, currently driving trucks for a local firm. Quit smoking 1990 after smoking for 20 years. Drinks heavilyu (1 bottle of vodka or 18-24 beers a day). Used to smoke marijuana but quit in 1990s. Has united healthcare insurance. Goes to [employer] for alcohol cessation   Social Determinants of Health   Financial Resource Strain: Not on file  Food Insecurity: Not on file  Transportation Needs: Not on file  Physical Activity: Not on file  Stress: Not on file  Social Connections: Not on file     Family History: The patient's family history includes Heart attack (age of onset: 32) in his brother; Prostate  cancer in his father. There is no history of Anesthesia problems, Hypotension, Malignant hyperthermia, Pseudochol deficiency, Colon cancer, Stomach cancer, Esophageal cancer, or Colon polyps.  ROS:   Please see the history of present illness.    + DOE + bilateral hand numbness, pallor All other systems reviewed and are negative.  Labs/Other Studies Reviewed:    The following studies were reviewed today:  NM Pet Cardiac Perfusion 02/04/22   The study is normal. The study is low risk.   LV perfusion is normal. There is no evidence of ischemia. There is no evidence of infarction.   Rest left ventricular function is normal. Rest EF: 60 %. Stress left ventricular function is normal. Stress EF: 67 %. End diastolic cavity size is normal. End systolic cavity size is normal. No evidence of transient ischemic dilation (TID) noted.   Myocardial blood flow was computed to be 1.46ml/g/min at rest and 2.1ml/g/min at stress. Global myocardial blood flow reserve was 1.74. With stress flows of 2 ml/g/min, MBF is likely normal and CFR is low due to higher resting flow. Overall, findings are normal.   Coronary calcium assessment not performed due to prior revascularization.  Echo 01/22/22 1. Left ventricular ejection fraction, by estimation, is 65 to 70%. The  left ventricle has normal function. The left ventricle has no regional  wall motion abnormalities. Left ventricular diastolic parameters were  normal.   2. Right ventricular systolic function is normal. The right ventricular  size is normal. Tricuspid regurgitation signal is inadequate for assessing  PA pressure.   3. The mitral valve is normal in structure. No evidence of mitral valve  regurgitation.   4. The aortic valve is tricuspid. Aortic valve regurgitation is not  visualized. Aortic valve sclerosis is present, with no evidence of aortic  valve stenosis.   5. The inferior vena cava is normal in size with greater than 50%  respiratory  variability, suggesting right atrial pressure of 3 mmHg.    Exercise Myoview 10/2015  Nuclear stress EF: 63%. The left ventricular ejection fraction is normal (55-65%). ST segment depression of 1 mm was noted during stress in the V5 and V6 leads. Defect 1: There is a small defect of mild severity present in the basal inferior location. Likely diaphragmatic attenuation. This is a low risk study. No  significant ischemia identified.  Recent Labs: 01/27/2022: ALT 176 01/28/2022: BUN 33; Creatinine, Ser 1.01; Potassium 4.6; Sodium 138  Recent Lipid Panel    Component Value Date/Time   CHOL 168 01/27/2022 1005   TRIG 144 01/27/2022 1005   HDL 35 (L) 01/27/2022 1005   CHOLHDL 4.8 01/27/2022 1005   CHOLHDL 2.2 11/18/2015 0748   VLDL 13 11/18/2015 0748   LDLCALC 107 (H) 01/27/2022 1005     Risk Assessment/Calculations:       Physical Exam:    VS:  BP 124/68   Ht 5' 10.5" (1.791 m)   Wt 206 lb 3.2 oz (93.5 kg)   BMI 29.17 kg/m     Wt Readings from Last 3 Encounters:  02/12/22 206 lb 3.2 oz (93.5 kg)  01/28/22 205 lb 6.4 oz (93.2 kg)  01/22/22 206 lb 6.4 oz (93.6 kg)     GEN:  Well nourished, well developed in no acute distress HEENT: Normal NECK: No JVD; No carotid bruits CARDIAC: RRR, no murmurs, rubs, gallops RESPIRATORY:  Clear to auscultation without rales, wheezing or rhonchi  ABDOMEN: Soft, non-tender, non-distended MUSCULOSKELETAL:  Bilateral lower and upper extremity edema;  Bilateral hands are pale, cold to touch. 2+ pedal/radial pulses, equal bilaterally SKIN: Warm and dry NEUROLOGIC:  Alert and oriented x 3 PSYCHIATRIC:  Normal affect   EKG:  EKG is not ordered today.    Diagnoses:    1. DOE (dyspnea on exertion)   2. Generalized edema   3. Coronary artery disease involving native coronary artery of native heart without angina pectoris   4. Hyperlipidemia LDL goal <70   5. PAC (premature atrial contraction)   6. Abnormal liver enzymes   7. Essential  hypertension      Assessment and Plan:     Frequent PACs: Regular rhythm on exam today. No palpitations. History of bifascicular block. Normal LV function on echo 01/22/22. Tolerating Toprol 50 mg every evening without side effects.   CAD with angina: History of remote bare-metal stent to LAD. Symptoms of DOE, fatigue persist. No chest pain. Normal myocardial blood flow, normal LV function, normal end diastolic cavity size on PET CT 02/03/22. No indication for further ischemia evaluation. Will refer to pulmonology for evaluation of lung function. No bleeding concerns. Continue Plavix, lisinopril, metoprolol.   DOE: Increased shortness of breath with exertion. Has bilateral upper and lower extremity edema, non-pitting. No chest pain, orthopnea, PND. Swelling without significant improvement on Lasix. Normal heart function and no significant valve function on echo. Will refer to pulmonology for evaluation of lung function that may be contributing to DOE.   Swelling: Bilateral lower and upper extremity swelling for 6 months. Little improvement on Lasix. Normal biventricular heart function on echo 01/22/2022.  Admits to dietary indiscretion with sodium. Will change Lasix to as needed. Hands are also pale and cool to touch. Encouraged [name] to follow-up with PCP for possible Raynaud's disease.   Hypertension: BP is well-controlled today. Home BP readings are well-controlled. He will reduce Lasix to as needed for swelling. Advised [name] to monitor home BP and if SBP consistently < 120 mmHg, he can reduce lisinopril to 10 mg daily.   Hyperlipidemia LDL goal < 70: LDL 107 on 01/27/22. Intolerant of statins 2/2 transaminitis. tarted on Praluent per management by lipid clinic. Has appointment for labs 03/2022 to evaluate effectiveness of PCSK9i.   Elevated LFTs: Liver enzymes remained elevated despite stopping statin therapy.  AST 205 and ALT 176 on 01/27/22.  Referred to  GI for evaluation. Continue to avoid  alcohol.    Disposition: 6 months with Sean [name] or APP  Medication Adjustments/Labs and Tests Ordered: Current medicines are reviewed at length with the patient today.  Concerns regarding medicines are outlined above.  Orders Placed This Encounter  Procedures   Ambulatory referral to Pulmonology   Meds ordered this encounter  Medications   furosemide (LASIX) 20 MG tablet    Sig: Take 1 tablet (20 mg total) by mouth as needed for fluid or edema.    Dispense:  90 tablet    Refill:  3    Patient Instructions  Medication Instructions:   Your physician recommends that you continue on your current medications as directed. Please refer to the Current Medication list given to you today.  If you blood pressure (systolic) (top number) consistently stays below 120 you can reduce your lisinopril one -half (0.5) tablet by mouth  (10 mg) daily.   *If you need a refill on your cardiac medications before your next appointment, please call your pharmacy*   Lab Work:  Keep your upcoming lab appointment.   If you have labs (blood work) drawn today and your tests are completely normal, you will receive your results only by: [place] (if you have MyChart) OR A paper copy in the mail If you have any lab test that is abnormal or we need to change your treatment, we will call you to review the results.   Testing/Procedures:  None ordered.   Follow-Up: You have been referred to Dr. Valeta Harms, pulmonology. Their office will call you to schedule.    At [facility], you and your health needs are our priority.  As part of our continuing mission to provide you with exceptional heart care, we have created designated Provider Care Teams.  These Care Teams include your primary Cardiologist (physician) and Advanced Practice Providers (APPs -  Physician Assistants and Nurse Practitioners) who all work together to provide you with the care you need, when you need it.  We recommend  signing up for the patient portal called "MyChart".  Sign up information is provided on this After Visit Summary.  MyChart is used to connect with patients for Virtual Visits (Telemedicine).  Patients are able to view lab/test results, encounter notes, upcoming appointments, etc.  Non-urgent messages can be sent to your provider as well.   To learn more about what you can do with MyChart, go to [email or web address].    Your next appointment:   6 month(s)  The format for your next appointment:   In Person  Provider:   Fransico Him, MD     Important Information About Sugar         Signed, Emmaline Life, NP  02/12/2022 10:02 AM    [place]

## 2022-02-11 ENCOUNTER — Ambulatory Visit (HOSPITAL_COMMUNITY)
Admission: RE | Admit: 2022-02-11 | Discharge: 2022-02-11 | Disposition: A | Discharge status: Home / Self Care | Payer: PPO | Source: Ambulatory Visit | Attending: Internal Medicine | Admitting: Internal Medicine

## 2022-02-11 DIAGNOSIS — R634 Abnormal weight loss: Secondary | ICD-10-CM | POA: Diagnosis not present

## 2022-02-11 DIAGNOSIS — R7989 Other specified abnormal findings of blood chemistry: Secondary | ICD-10-CM | POA: Insufficient documentation

## 2022-02-11 DIAGNOSIS — R1900 Intra-abdominal and pelvic swelling, mass and lump, unspecified site: Secondary | ICD-10-CM | POA: Insufficient documentation

## 2022-02-11 MED ORDER — IOHEXOL 300 MG/ML  SOLN
100.0000 mL | Freq: Once | INTRAMUSCULAR | Status: AC | PRN
  Administered 2022-02-11: 100 mL via INTRAVENOUS

## 2022-02-12 ENCOUNTER — Encounter: Payer: Self-pay | Admitting: Nurse Practitioner

## 2022-02-12 ENCOUNTER — Ambulatory Visit: Payer: PPO | Attending: Nurse Practitioner | Admitting: Nurse Practitioner

## 2022-02-12 VITALS — BP 124/68 | Ht 70.5 in | Wt 206.2 lb

## 2022-02-12 DIAGNOSIS — E785 Hyperlipidemia, unspecified: Secondary | ICD-10-CM

## 2022-02-12 DIAGNOSIS — I10 Essential (primary) hypertension: Secondary | ICD-10-CM

## 2022-02-12 DIAGNOSIS — I491 Atrial premature depolarization: Secondary | ICD-10-CM | POA: Diagnosis not present

## 2022-02-12 DIAGNOSIS — R748 Abnormal levels of other serum enzymes: Secondary | ICD-10-CM | POA: Diagnosis not present

## 2022-02-12 DIAGNOSIS — I2510 Atherosclerotic heart disease of native coronary artery without angina pectoris: Secondary | ICD-10-CM | POA: Diagnosis not present

## 2022-02-12 DIAGNOSIS — R601 Generalized edema: Secondary | ICD-10-CM | POA: Diagnosis not present

## 2022-02-12 DIAGNOSIS — R0609 Other forms of dyspnea: Secondary | ICD-10-CM | POA: Diagnosis not present

## 2022-02-12 MED ORDER — FUROSEMIDE 20 MG PO TABS: 20.0000 mg | ORAL_TABLET | ORAL | 3 refills | Status: DC | PRN

## 2022-02-12 NOTE — Patient Instructions (Addendum)
Medication Instructions:   Your physician recommends that you continue on your current medications as directed. Please refer to the Current Medication list given to you today.  If you blood pressure (systolic) (top number) consistently stays below 120 you can reduce your lisinopril one -half (0.5) tablet by mouth  (10 mg) daily.   *If you need a refill on your cardiac medications before your next appointment, please call your pharmacy*   Lab Work:  Keep your upcoming lab appointment.   If you have labs (blood work) drawn today and your tests are completely normal, you will receive your results only by: MyChart Message (if you have MyChart) OR A paper copy in the mail If you have any lab test that is abnormal or we need to change your treatment, we will call you to review the results.   Testing/Procedures:  None ordered.   Follow-Up: You have been referred to Dr. Tonia Brooms, pulmonology. Their office will call you to schedule.    At [facility], you and your health needs are our priority.  As part of our continuing mission to provide you with exceptional heart care, we have created designated Provider Care Teams.  These Care Teams include your primary Cardiologist (physician) and Advanced Practice Providers (APPs -  Physician Assistants and Nurse Practitioners) who all work together to provide you with the care you need, when you need it.  We recommend signing up for the patient portal called "MyChart".  Sign up information is provided on this After Visit Summary.  MyChart is used to connect with patients for Virtual Visits (Telemedicine).  Patients are able to view lab/test results, encounter notes, upcoming appointments, etc.  Non-urgent messages can be sent to your provider as well.   To learn more about what you can do with MyChart, go to [email or web address].    Your next appointment:   6 month(s)  The format for your next appointment:   In Person  Provider:    Armanda Magic, MD     Important Information About Sugar

## 2022-02-18 DIAGNOSIS — R5383 Other fatigue: Secondary | ICD-10-CM | POA: Diagnosis not present

## 2022-02-18 DIAGNOSIS — R748 Abnormal levels of other serum enzymes: Secondary | ICD-10-CM | POA: Diagnosis not present

## 2022-02-18 DIAGNOSIS — R609 Edema, unspecified: Secondary | ICD-10-CM | POA: Diagnosis not present

## 2022-02-19 DIAGNOSIS — R5383 Other fatigue: Secondary | ICD-10-CM | POA: Diagnosis not present

## 2022-03-03 DIAGNOSIS — R319 Hematuria, unspecified: Secondary | ICD-10-CM | POA: Diagnosis not present

## 2022-03-11 ENCOUNTER — Encounter: Payer: Self-pay | Admitting: Pulmonary Disease

## 2022-03-11 ENCOUNTER — Ambulatory Visit (INDEPENDENT_AMBULATORY_CARE_PROVIDER_SITE_OTHER): Payer: PPO | Admitting: Pulmonary Disease

## 2022-03-11 VITALS — BP 120/70 | HR 81 | Ht 68.0 in | Wt 201.6 lb

## 2022-03-11 DIAGNOSIS — I7300 Raynaud's syndrome without gangrene: Secondary | ICD-10-CM

## 2022-03-11 DIAGNOSIS — R0609 Other forms of dyspnea: Secondary | ICD-10-CM | POA: Diagnosis not present

## 2022-03-11 DIAGNOSIS — Z87891 Personal history of nicotine dependence: Secondary | ICD-10-CM | POA: Diagnosis not present

## 2022-03-11 DIAGNOSIS — M791 Myalgia, unspecified site: Secondary | ICD-10-CM

## 2022-03-11 LAB — C-REACTIVE PROTEIN: CRP: <1 mg/dL (ref 0.5–20.0)

## 2022-03-11 LAB — CK: Total CK: 6777 U/L — ABNORMAL HIGH (ref 7–232)

## 2022-03-11 NOTE — Progress Notes (Signed)
Synopsis: Referred in January 2024 for dyspnea on exertion by Swinyer, Lanice Schwab, NP  Subjective:   PATIENT ID: Sean [name] GENDER: male DOB: [birth year], MRN: [id number]  Chief Complaint  Patient presents with   Consult    DOE, Edema    This is a 72 year old gentleman, history of alcohol abuse, coronary artery disease, depression, hypertension, hyperlipidemia.  Patient is a former smoker quit in 19 90, 34-pack-year history. He trys to walk a lot. He has dogs that he walks regularly. Over the past 4 months he has had increased sob and fatigue. Distances and inclines make walking worse. He saw cardiology and they did several studies and everything seemed fine. He was sent to GI for evaluation for elevated LFTs, still drinking alcohol daily.  He feels like he is becoming weaker and weaker over the past couple of months.  He is finding that he is having pains in his muscles and when he describes this he points to locations in large muscle groups and side of the thigh as well as shoulders.  He is also noticed ongoing weight loss.  He also has recurrent episodes of Raynaud's of the hands.     Past Medical History:  Diagnosis Date   Alcohol addiction ([place])    2010- admitted for withdrawal , detox admission after that   Arrhythmia    Coronary artery disease    95% mid LAD s/p BMS of LAD with aneurysmal segment   Depression    ED (erectile dysfunction)    HLD (hyperlipidemia)    HTN (hypertension)    Mental disorder    Peptic ulcer    RBBB 10/24/2015     Family History  Problem Relation Age of Onset   Prostate cancer Father    Heart attack Brother 19   Anesthesia problems Neg Hx    Hypotension Neg Hx    Malignant hyperthermia Neg Hx    Pseudochol deficiency Neg Hx    Colon cancer Neg Hx    Stomach cancer Neg Hx    Esophageal cancer Neg Hx    Colon polyps Neg Hx      Past Surgical History:  Procedure Laterality Date   CORONARY STENT PLACEMENT      Social History    Socioeconomic History   Marital status: Married    Spouse name: Not on file   Number of children: 2   Years of education: Not on file   Highest education level: Not on file  Occupational History   Occupation: Retired Recruitment consultant  Tobacco Use   Smoking status: Former    Types: Cigarettes    Quit date: 1990    Years since quitting: 34.0   Smokeless tobacco: Never  Vaping Use   Vaping Use: Never used  Substance and Sexual Activity   Alcohol use: Yes    Comment: quit 07/2006   Drug use: No   Sexual activity: Yes    Birth control/protection: None    Comment: Married  Other Topics Concern   Not on file  Social History Narrative   Live with wife in [place], is an Chief Financial Officer, currently driving trucks for a local firm. Quit smoking 1990 after smoking for 20 years. Drinks heavilyu (1 bottle of vodka or 18-24 beers a day). Used to smoke marijuana but quit in 1990s. Has united healthcare insurance. Goes to [employer] for alcohol cessation   Social Determinants of Health   Financial Resource Strain: Not on file  Food Insecurity: Not on file  Transportation Needs: Not on file  Physical Activity: Not on file  Stress: Not on file  Social Connections: Not on file  Intimate Partner Violence: Not on file     No Known Allergies   Outpatient Medications Prior to Visit  Medication Sig Dispense Refill   clopidogrel (PLAVIX) 75 MG tablet Take 1 tablet (75 mg total) by mouth daily. 90 tablet 3   Evolocumab (REPATHA SURECLICK) 140 MG/ML SOAJ Inject 140 mg into the skin every 14 (fourteen) days. 2 mL 11   furosemide (LASIX) 20 MG tablet Take 1 tablet (20 mg total) by mouth as needed for fluid or edema. 90 tablet 3   lisinopril (ZESTRIL) 20 MG tablet Take 1 tablet (20 mg total) by mouth daily. 90 tablet 3   metoprolol succinate (TOPROL-XL) 50 MG 24 hr tablet Take 1 tablet (50 mg total) by mouth every evening. Take with or immediately following a meal. 90 tablet 3   Multiple Vitamin  (MULITIVITAMIN WITH MINERALS) TABS Take 1 tablet by mouth daily.     nitroGLYCERIN (NITROSTAT) 0.4 MG SL tablet Place 1 tablet (0.4 mg total) under the tongue every 5 (five) minutes as needed for chest pain. 25 tablet 3   potassium chloride (KLOR-CON M) 10 MEQ tablet Take 1 tablet (10 mEq total) by mouth daily. 90 tablet 3   No facility-administered medications prior to visit.    Review of Systems  Constitutional:  Negative for chills, fever, malaise/fatigue and weight loss.  HENT:  Negative for hearing loss, sore throat and tinnitus.   Eyes:  Negative for blurred vision and double vision.  Respiratory:  Negative for cough, hemoptysis, sputum production, shortness of breath, wheezing and stridor.   Cardiovascular:  Positive for leg swelling. Negative for chest pain, palpitations, orthopnea and PND.  Gastrointestinal:  Negative for abdominal pain, constipation, diarrhea, heartburn, nausea and vomiting.  Genitourinary:  Negative for dysuria, hematuria and urgency.  Musculoskeletal:  Positive for myalgias. Negative for joint pain.  Skin:  Negative for itching and rash.  Neurological:  Negative for dizziness, tingling, weakness and headaches.  Endo/Heme/Allergies:  Negative for environmental allergies. Does not bruise/bleed easily.  Psychiatric/Behavioral:  Negative for depression. The patient is not nervous/anxious and does not have insomnia.   All other systems reviewed and are negative.    Objective:  Physical Exam Vitals reviewed.  Constitutional:      General: He is not in acute distress.    Appearance: He is well-developed.  HENT:     Head: Normocephalic and atraumatic.  Eyes:     General: No scleral icterus.    Conjunctiva/sclera: Conjunctivae normal.     Pupils: Pupils are equal, round, and reactive to light.  Neck:     Vascular: No JVD.     Trachea: No tracheal deviation.  Cardiovascular:     Rate and Rhythm: Normal rate and regular rhythm.     Heart sounds: No murmur  heard. Pulmonary:     Effort: Pulmonary effort is normal. No tachypnea, accessory muscle usage or respiratory distress.     Breath sounds: Normal breath sounds. No stridor. No wheezing, rhonchi or rales.  Abdominal:     General: Bowel sounds are normal. There is no distension.     Tenderness: There is no abdominal tenderness.  Musculoskeletal:        General: No tenderness.     Cervical back: Neck supple.     Right lower leg: Edema present.     Left lower leg: Edema present.  Comments: Cool, white discolored fingers and fingertips.  Lymphadenopathy:     Cervical: No cervical adenopathy.  Skin:    General: Skin is warm and dry.     Capillary Refill: Capillary refill takes less than 2 seconds.     Findings: No rash.  Neurological:     Mental Status: He is alert and oriented to person, place, and time.  Psychiatric:        Behavior: Behavior normal.      Vitals:   03/11/22 1040  BP: 120/70  Pulse: 81  SpO2: 95%  Weight: 201 lb 9.6 oz (91.4 kg)  Height: 5\' 8"  (1.727 m)   95% on RA BMI Readings from Last 3 Encounters:  03/11/22 30.65 kg/m  02/12/22 29.17 kg/m  01/28/22 28.65 kg/m   Wt Readings from Last 3 Encounters:  03/11/22 201 lb 9.6 oz (91.4 kg)  02/12/22 206 lb 3.2 oz (93.5 kg)  01/28/22 205 lb 6.4 oz (93.2 kg)     CBC    Component Value Date/Time   WBC 4.3 05/09/2011 1932   RBC 3.69 (L) 05/09/2011 1932   HGB 11.7 (L) 05/09/2011 1932   HCT 36.1 (L) 05/09/2011 1932   PLT 319 05/09/2011 1932   MCV 97.8 05/09/2011 1932   MCH 31.7 05/09/2011 1932   MCHC 32.4 05/09/2011 1932   RDW 13.2 05/09/2011 1932   LYMPHSABS 0.3 (L) 04/29/2011 0856   MONOABS 0.2 04/29/2011 0856   EOSABS 0.0 04/29/2011 0856   BASOSABS 0.0 04/29/2011 0856     Chest Imaging:  02/11/2022: CT abdomen pelvis enlarged prostate.  Lung fields appear clear. The patient's images have been independently reviewed by me.     Pulmonary Functions Testing Results:     No data to  display          FeNO:   Pathology:   Echocardiogram:   Nuclear medicine PET/CT perfusion study   The study is normal. The study is low risk.   LV perfusion is normal. There is no evidence of ischemia. There is no evidence of infarction.   Rest left ventricular function is normal. Rest EF: 60 %. Stress left ventricular function is normal. Stress EF: 67 %. End diastolic cavity size is normal. End systolic cavity size is normal. No evidence of transient ischemic dilation (TID) noted.   Myocardial blood flow was computed to be 1.72ml/g/min at rest and 2.110ml/g/min at stress. Global myocardial blood flow reserve was 1.74. With stress flows of 2 ml/g/min, MBF is likely normal and CFR is low due to higher resting flow. Overall, findings are normal.   Coronary calcium assessment not performed due to prior revascularization.   Electronically signed by: Elouise Munroe, MD  Heart Catheterization:     Assessment & Plan:     ICD-10-CM   1. DOE (dyspnea on exertion)  R06.09 Pulmonary Function Test    MyoMarker 3 Plus Profile (RDL)    Hypersensitivity Pneumonitis    RNP Antibodies    Anti-scleroderma antibody    Aldolase    CK    Cyclic citrul peptide antibody, IgG    Rheumatoid factor    ANA+ENA+DNA/DS+Scl 70+SjoSSA/B    ANA,IFA RA Diag Pnl w/rflx Tit/Patn    Rheumatoid factor    Cyclic citrul peptide antibody, IgG    CK    2. Myalgia  M79.10     3. Raynaud's phenomenon without gangrene  I73.00     4. Former smoker  Z87.891       Discussion:  This is a 72 year old gentleman with progressive dyspnea on exertion with associated myalgia, Raynaud's phenomenon of the hands.  Also recently diagnosed with hypothyroidism.  He is a former smoker with a 30-pack-year history of smoking quit 1990.  Plan: Will get PFTs to see if he have any lung function decline that is causing him to have his dyspnea symptoms. I am more interested in his myalgias symptoms and history of Raynaud's. He  also had recent lab work that showed elevated AST which can be seen in muscle inflammation. Will obtain a CK, CRP, aldolase and other autoimmune lab workup to see if were dealing with a potential underlying autoimmune reason for his muscle pain fatigue and lab abnormalities.    Current Outpatient Medications:    clopidogrel (PLAVIX) 75 MG tablet, Take 1 tablet (75 mg total) by mouth daily., Disp: 90 tablet, Rfl: 3   Evolocumab (REPATHA SURECLICK) 628 MG/ML SOAJ, Inject 140 mg into the skin every 14 (fourteen) days., Disp: 2 mL, Rfl: 11   furosemide (LASIX) 20 MG tablet, Take 1 tablet (20 mg total) by mouth as needed for fluid or edema., Disp: 90 tablet, Rfl: 3   lisinopril (ZESTRIL) 20 MG tablet, Take 1 tablet (20 mg total) by mouth daily., Disp: 90 tablet, Rfl: 3   metoprolol succinate (TOPROL-XL) 50 MG 24 hr tablet, Take 1 tablet (50 mg total) by mouth every evening. Take with or immediately following a meal., Disp: 90 tablet, Rfl: 3   Multiple Vitamin (MULITIVITAMIN WITH MINERALS) TABS, Take 1 tablet by mouth daily., Disp: , Rfl:    nitroGLYCERIN (NITROSTAT) 0.4 MG SL tablet, Place 1 tablet (0.4 mg total) under the tongue every 5 (five) minutes as needed for chest pain., Disp: 25 tablet, Rfl: 3   potassium chloride (KLOR-CON M) 10 MEQ tablet, Take 1 tablet (10 mEq total) by mouth daily., Disp: 90 tablet, Rfl: 3  I spent 62 minutes dedicated to the care of this patient on the date of this encounter to include pre-visit review of records, face-to-face time with the patient discussing conditions above, post visit ordering of testing, clinical documentation with the electronic health record, making appropriate referrals as documented, and communicating necessary findings to members of the patients care team.   Garner Nash, DO [facility] 03/11/2022 11:43 AM

## 2022-03-11 NOTE — Patient Instructions (Signed)
Thank you for visiting Dr. Valeta Harms at [facility]. Today we recommend the following:  Orders Placed This Encounter  Procedures   Pulmonary Function Test   Return in about 4 weeks (around 04/08/2022) for with APP.    Please do your part to reduce the spread of COVID-19.

## 2022-03-13 LAB — ANA,IFA RA DIAG PNL W/RFLX TIT/PATN
Anti Nuclear Antibody (ANA): POSITIVE — AB
Cyclic Citrullin Peptide Ab: <16 UNITS
Rheumatoid fact SerPl-aCnc: <14 IU/mL (ref <14)

## 2022-03-13 LAB — ALDOLASE: Aldolase: 83.1 U/L — ABNORMAL HIGH (ref <=8.1)

## 2022-03-13 LAB — ANTI-NUCLEAR AB-TITER (ANA TITER)
ANA TITER: 1:40 {titer} — ABNORMAL HIGH
ANA Titer 1: 1:1280 {titer} — ABNORMAL HIGH

## 2022-03-13 LAB — ANTI-SCLERODERMA ANTIBODY: Scleroderma (Scl-70) (ENA) Antibody, IgG: <1 AI

## 2022-03-16 ENCOUNTER — Other Ambulatory Visit: Payer: Self-pay | Admitting: Family Medicine

## 2022-03-16 DIAGNOSIS — R531 Weakness: Secondary | ICD-10-CM

## 2022-03-17 LAB — HYPERSENSITIVITY PNEUMONITIS
A. Pullulans Abs: NEGATIVE
A.Fumigatus #1 Abs: NEGATIVE
Micropolyspora faeni, IgG: NEGATIVE
Pigeon Serum Abs: NEGATIVE
Thermoact. Saccharii: NEGATIVE
Thermoactinomyces vulgaris, IgG: NEGATIVE

## 2022-03-17 LAB — ANA+ENA+DNA/DS+SCL 70+SJOSSA/B
ANA Titer 1: POSITIVE — AB
ENA RNP Ab: <0.2 AI (ref 0.0–0.9)
ENA SM Ab Ser-aCnc: <0.2 AI (ref 0.0–0.9)
ENA SSA (RO) Ab: 0.3 AI (ref 0.0–0.9)
ENA SSB (LA) Ab: <0.2 AI (ref 0.0–0.9)
Scleroderma (Scl-70) (ENA) Antibody, IgG: <0.2 AI (ref 0.0–0.9)
dsDNA Ab: 3 IU/mL (ref 0–9)

## 2022-03-17 LAB — MYOMARKER 3 PLUS PROFILE (RDL)

## 2022-03-17 LAB — FANA STAINING PATTERNS: Homogeneous Pattern: 1:640 {titer} — ABNORMAL HIGH

## 2022-03-18 ENCOUNTER — Inpatient Hospital Stay (HOSPITAL_COMMUNITY)
Admission: AD | Admit: 2022-03-18 | Discharge: 2022-03-24 | DRG: 501 | Disposition: A | Discharge status: Home / Self Care | Payer: PPO | Attending: Internal Medicine | Admitting: Internal Medicine

## 2022-03-18 ENCOUNTER — Encounter: Payer: Self-pay | Admitting: Pulmonary Disease

## 2022-03-18 ENCOUNTER — Encounter (HOSPITAL_COMMUNITY): Payer: Self-pay | Admitting: Internal Medicine

## 2022-03-18 ENCOUNTER — Ambulatory Visit: Payer: PPO | Admitting: Pulmonary Disease

## 2022-03-18 ENCOUNTER — Encounter (HOSPITAL_COMMUNITY): Payer: Self-pay

## 2022-03-18 ENCOUNTER — Other Ambulatory Visit: Payer: Self-pay

## 2022-03-18 VITALS — BP 110/70 | HR 83 | Ht 68.0 in | Wt 199.0 lb

## 2022-03-18 DIAGNOSIS — Z79899 Other long term (current) drug therapy: Secondary | ICD-10-CM

## 2022-03-18 DIAGNOSIS — M1612 Unilateral primary osteoarthritis, left hip: Secondary | ICD-10-CM | POA: Diagnosis not present

## 2022-03-18 DIAGNOSIS — M609 Myositis, unspecified: Secondary | ICD-10-CM

## 2022-03-18 DIAGNOSIS — M7989 Other specified soft tissue disorders: Secondary | ICD-10-CM | POA: Diagnosis not present

## 2022-03-18 DIAGNOSIS — R531 Weakness: Secondary | ICD-10-CM | POA: Diagnosis not present

## 2022-03-18 DIAGNOSIS — G729 Myopathy, unspecified: Secondary | ICD-10-CM | POA: Diagnosis not present

## 2022-03-18 DIAGNOSIS — M791 Myalgia, unspecified site: Secondary | ICD-10-CM

## 2022-03-18 DIAGNOSIS — R262 Difficulty in walking, not elsewhere classified: Secondary | ICD-10-CM | POA: Diagnosis present

## 2022-03-18 DIAGNOSIS — Z6831 Body mass index (BMI) 31.0-31.9, adult: Secondary | ICD-10-CM

## 2022-03-18 DIAGNOSIS — R634 Abnormal weight loss: Secondary | ICD-10-CM | POA: Diagnosis not present

## 2022-03-18 DIAGNOSIS — M6089 Other myositis, multiple sites: Secondary | ICD-10-CM | POA: Diagnosis not present

## 2022-03-18 DIAGNOSIS — N529 Male erectile dysfunction, unspecified: Secondary | ICD-10-CM | POA: Diagnosis present

## 2022-03-18 DIAGNOSIS — I10 Essential (primary) hypertension: Secondary | ICD-10-CM | POA: Diagnosis not present

## 2022-03-18 DIAGNOSIS — Z8249 Family history of ischemic heart disease and other diseases of the circulatory system: Secondary | ICD-10-CM

## 2022-03-18 DIAGNOSIS — F1020 Alcohol dependence, uncomplicated: Secondary | ICD-10-CM | POA: Insufficient documentation

## 2022-03-18 DIAGNOSIS — I2510 Atherosclerotic heart disease of native coronary artery without angina pectoris: Secondary | ICD-10-CM | POA: Diagnosis present

## 2022-03-18 DIAGNOSIS — M6282 Rhabdomyolysis: Secondary | ICD-10-CM | POA: Diagnosis not present

## 2022-03-18 DIAGNOSIS — E785 Hyperlipidemia, unspecified: Secondary | ICD-10-CM | POA: Diagnosis present

## 2022-03-18 DIAGNOSIS — F32A Depression, unspecified: Secondary | ICD-10-CM | POA: Diagnosis present

## 2022-03-18 DIAGNOSIS — Z79890 Hormone replacement therapy (postmenopausal): Secondary | ICD-10-CM

## 2022-03-18 DIAGNOSIS — Z955 Presence of coronary angioplasty implant and graft: Secondary | ICD-10-CM

## 2022-03-18 DIAGNOSIS — R748 Abnormal levels of other serum enzymes: Secondary | ICD-10-CM | POA: Diagnosis not present

## 2022-03-18 DIAGNOSIS — E039 Hypothyroidism, unspecified: Secondary | ICD-10-CM | POA: Diagnosis present

## 2022-03-18 DIAGNOSIS — Z87891 Personal history of nicotine dependence: Secondary | ICD-10-CM

## 2022-03-18 DIAGNOSIS — R600 Localized edema: Secondary | ICD-10-CM | POA: Diagnosis present

## 2022-03-18 DIAGNOSIS — Z7902 Long term (current) use of antithrombotics/antiplatelets: Secondary | ICD-10-CM | POA: Diagnosis not present

## 2022-03-18 DIAGNOSIS — I25118 Atherosclerotic heart disease of native coronary artery with other forms of angina pectoris: Secondary | ICD-10-CM | POA: Diagnosis not present

## 2022-03-18 DIAGNOSIS — E669 Obesity, unspecified: Secondary | ICD-10-CM | POA: Diagnosis present

## 2022-03-18 DIAGNOSIS — R7401 Elevation of levels of liver transaminase levels: Secondary | ICD-10-CM | POA: Diagnosis not present

## 2022-03-18 DIAGNOSIS — E876 Hypokalemia: Secondary | ICD-10-CM | POA: Diagnosis not present

## 2022-03-18 LAB — COMPREHENSIVE METABOLIC PANEL
ALT: 215 U/L — ABNORMAL HIGH (ref 0–44)
AST: 255 U/L — ABNORMAL HIGH (ref 15–41)
Albumin: 3.3 g/dL — ABNORMAL LOW (ref 3.5–5.0)
Alkaline Phosphatase: 59 U/L (ref 38–126)
Anion gap: 9 (ref 5–15)
BUN: 37 mg/dL — ABNORMAL HIGH (ref 8–23)
CO2: 28 mmol/L (ref 22–32)
Calcium: 9 mg/dL (ref 8.9–10.3)
Chloride: 102 mmol/L (ref 98–111)
Creatinine, Ser: 1.07 mg/dL (ref 0.61–1.24)
GFR, Estimated: >60 mL/min (ref >60)
Glucose, Bld: 99 mg/dL (ref 70–99)
Potassium: 3.8 mmol/L (ref 3.5–5.1)
Sodium: 139 mmol/L (ref 135–145)
Total Bilirubin: 0.5 mg/dL (ref 0.3–1.2)
Total Protein: 6.2 g/dL — ABNORMAL LOW (ref 6.5–8.1)

## 2022-03-18 LAB — CBC
HCT: 43.4 % (ref 39.0–52.0)
Hemoglobin: 14.1 g/dL (ref 13.0–17.0)
MCH: 30.5 pg (ref 26.0–34.0)
MCHC: 32.5 g/dL (ref 30.0–36.0)
MCV: 93.9 fL (ref 80.0–100.0)
Platelets: 320 10*3/uL (ref 150–400)
RBC: 4.62 MIL/uL (ref 4.22–5.81)
RDW: 14.2 % (ref 11.5–15.5)
WBC: 7.3 10*3/uL (ref 4.0–10.5)
nRBC: 0 % (ref 0.0–0.2)

## 2022-03-18 LAB — MAGNESIUM: Magnesium: 2 mg/dL (ref 1.7–2.4)

## 2022-03-18 LAB — SEDIMENTATION RATE: Sed Rate: 15 mm/hr (ref 0–20)

## 2022-03-18 LAB — GLUCOSE, CAPILLARY: Glucose-Capillary: 98 mg/dL (ref 70–99)

## 2022-03-18 MED ORDER — CLOPIDOGREL BISULFATE 75 MG PO TABS
75.0000 mg | ORAL_TABLET | Freq: Every day | ORAL | Status: DC
  Filled 2022-03-18: qty 1

## 2022-03-18 MED ORDER — ACETAMINOPHEN 325 MG PO TABS: 650.0000 mg | ORAL_TABLET | Freq: Four times a day (QID) | ORAL | Status: DC | PRN

## 2022-03-18 MED ORDER — METOPROLOL SUCCINATE ER 50 MG PO TB24
50.0000 mg | ORAL_TABLET | Freq: Every evening | ORAL | Status: DC
  Administered 2022-03-19 – 2022-03-23 (×5): 50 mg via ORAL
  Filled 2022-03-18 (×5): qty 1

## 2022-03-18 MED ORDER — HYDROCODONE-ACETAMINOPHEN 5-325 MG PO TABS: 1.0000 | ORAL_TABLET | ORAL | Status: DC | PRN

## 2022-03-18 MED ORDER — ACETAMINOPHEN 650 MG RE SUPP: 650.0000 mg | Freq: Four times a day (QID) | RECTAL | Status: DC | PRN

## 2022-03-18 MED ORDER — LEVOTHYROXINE SODIUM 25 MCG PO TABS
25.0000 ug | ORAL_TABLET | Freq: Every day | ORAL | Status: DC
  Administered 2022-03-19 – 2022-03-24 (×6): 25 ug via ORAL
  Filled 2022-03-18 (×5): qty 1

## 2022-03-18 MED ORDER — ENOXAPARIN SODIUM 40 MG/0.4ML IJ SOSY
40.0000 mg | PREFILLED_SYRINGE | INTRAMUSCULAR | Status: DC
  Administered 2022-03-18 – 2022-03-23 (×4): 40 mg via SUBCUTANEOUS
  Filled 2022-03-18 (×6): qty 0.4

## 2022-03-18 MED ORDER — POLYETHYLENE GLYCOL 3350 17 G PO PACK: 17.0000 g | PACK | Freq: Every day | ORAL | Status: DC | PRN

## 2022-03-18 MED ORDER — LISINOPRIL 20 MG PO TABS
20.0000 mg | ORAL_TABLET | Freq: Every day | ORAL | Status: DC
  Administered 2022-03-19 – 2022-03-23 (×5): 20 mg via ORAL
  Filled 2022-03-18 (×5): qty 1

## 2022-03-18 NOTE — H&P (Signed)
History and Physical    Sean [name] JKD:[id number] DOB: [birth year] DOA: 03/18/2022  PCP: [name], MD   Patient coming from: Home   Chief Complaint: Muscle aches, weakness   HPI: Sean [name] is a 72 y.o. male with medical history significant for hypertension, hyperlipidemia, and CAD with remote PCI who presents to the hospital as direct admission for expedited muscle biopsy and initiation of myositis treatment.  Patient reports months of progressive generalized weakness with mild muscle aches, primarily involving the shoulders, thighs, and trunk.  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  He has extremity swelling associated with this but no fevers or rash.   He was evaluated by cardiology for his activity intolerance and had echocardiogram with normal EF and no diastolic dysfunction.  He was started on Lasix for his swelling, but has not improved with this.  He had elevated transaminases that were noted to be increasing, saw GI for this, and had CT of the abdomen and pelvis performed that was negative for any acute hepatobiliary finding.  He has a history of alcoholism but reports just 0-1 drinks per day for the past several months.   He was evaluated by pulmonology and had workup notable for serum CK of 6777, aldolase of 83, and positive ANA.  With concern for acute myositis, direct admission to the hospital was arranged in order to expedite muscle biopsy and initiation of treatment.  Review of Systems:  All other systems reviewed and apart from HPI, are negative.  Past Medical History:  Diagnosis Date   Alcohol addiction ([place])    2010- admitted for withdrawal , detox admission after that   Arrhythmia    Coronary artery disease    95% mid LAD s/p BMS of LAD with aneurysmal segment   Depression    ED (erectile dysfunction)    HLD (hyperlipidemia)     HTN (hypertension)    Mental disorder    Peptic ulcer    RBBB 10/24/2015    Past Surgical History:  Procedure Laterality Date   CORONARY STENT PLACEMENT      Social History:   reports that he quit smoking about 34 years ago. His smoking use included cigarettes. He has never used smokeless tobacco. He reports current alcohol use. He reports that he does not use drugs.  No Known Allergies  Family History  Problem Relation Age of Onset   Prostate cancer Father    Heart attack Brother 70   Anesthesia problems Neg Hx    Hypotension Neg Hx    Malignant hyperthermia Neg Hx    Pseudochol deficiency Neg Hx    Colon cancer Neg Hx    Stomach cancer Neg Hx    Esophageal cancer Neg Hx    Colon polyps Neg Hx      Prior to Admission medications   Medication Sig Start Date End Date Taking? Authorizing Provider  clopidogrel (PLAVIX) 75 MG tablet Take 1 tablet (75 mg total) by mouth daily. 05/26/21  Yes Turner, Eber Hong, MD  Evolocumab (REPATHA SURECLICK) 099 MG/ML SOAJ Inject 140 mg into the skin every 14 (fourteen) days. 01/29/22  Yes Turner, Eber Hong, MD  furosemide (LASIX) 20 MG tablet Take 1 tablet (20 mg total) by mouth as needed for fluid or edema. Patient taking differently: Take 20 mg by mouth daily. 02/12/22 02/13/23 Yes  Swinyer, Lanice Schwab, NP  ibuprofen (ADVIL) 200 MG tablet Take 200 mg by mouth 2 (two) times daily as needed for headache, mild pain or moderate pain.   Yes [provider]  levothyroxine (SYNTHROID) 25 MCG tablet Take 25 mcg by mouth daily in the afternoon.   Yes [provider]  lisinopril (ZESTRIL) 20 MG tablet Take 1 tablet (20 mg total) by mouth daily. 01/22/22 01/23/23 Yes Swinyer, Lanice Schwab, NP  MELATONIN PO Take 1 tablet by mouth at bedtime as needed (sleep).   Yes [provider]  metoprolol succinate (TOPROL-XL) 50 MG 24 hr tablet Take 1 tablet (50 mg total) by mouth every evening. Take with or immediately following a meal. Patient  taking differently: Take 50 mg by mouth every evening. 01/22/22 01/23/23 Yes Swinyer, Lanice Schwab, NP  Multiple Vitamin (MULITIVITAMIN WITH MINERALS) TABS Take 1 tablet by mouth daily.   Yes [provider]  nitroGLYCERIN (NITROSTAT) 0.4 MG SL tablet Place 1 tablet (0.4 mg total) under the tongue every 5 (five) minutes as needed for chest pain. 01/12/22  Yes Swinyer, Lanice Schwab, NP  potassium chloride (KLOR-CON M) 10 MEQ tablet Take 1 tablet (10 mEq total) by mouth daily. 12/16/21 12/17/22 Yes Swinyer, Lanice Schwab, NP    Physical Exam: Vitals:   03/18/22 1746 03/18/22 1800  BP: 105/61 105/61  Pulse: 84 84  Resp: 16 16  Temp: 97.6 F (36.4 C) 98.6 F (37 C)  TempSrc: Oral Oral  SpO2: 98%   Weight:  90.3 kg  Height:  5\' 8"  (1.727 m)    Constitutional: NAD, calm  Eyes: PERTLA, lids and conjunctivae normal ENMT: Mucous membranes are moist. Posterior pharynx clear of any exudate or lesions.   Neck: supple, no masses  Respiratory: no wheezing, no crackles. No accessory muscle use.  Cardiovascular: S1 & S2 heard, regular rate and rhythm. Mild extremity edema.   Abdomen: No distension, no tenderness, soft. Bowel sounds active.  Musculoskeletal: no clubbing / cyanosis. No joint deformity upper and lower extremities.   Skin: no significant rashes, lesions, ulcers. Warm, dry, well-perfused. Neurologic: CN 2-12 grossly intact. Moving all extremities. Alert and oriented.  Psychiatric: Pleasant. Cooperative.    Labs and Imaging on Admission: I have personally reviewed following labs and imaging studies  CBC: No results for input(s): "WBC", "NEUTROABS", "HGB", "HCT", "MCV", "PLT" in the last 168 hours. Basic Metabolic Panel: No results for input(s): "NA", "K", "CL", "CO2", "GLUCOSE", "BUN", "CREATININE", "CALCIUM", "MG", "PHOS" in the last 168 hours. GFR: CrCl cannot be calculated (Patient's most recent lab result is older than the maximum 21 days allowed.). Liver Function Tests: No  results for input(s): "AST", "ALT", "ALKPHOS", "BILITOT", "PROT", "ALBUMIN" in the last 168 hours. No results for input(s): "LIPASE", "AMYLASE" in the last 168 hours. No results for input(s): "AMMONIA" in the last 168 hours. Coagulation Profile: No results for input(s): "INR", "PROTIME" in the last 168 hours. Cardiac Enzymes: No results for input(s): "CKTOTAL", "CKMB", "CKMBINDEX", "TROPONINI" in the last 168 hours. BNP (last 3 results) No results for input(s): "PROBNP" in the last 8760 hours. HbA1C: No results for input(s): "HGBA1C" in the last 72 hours. CBG: Recent Labs  Lab 03/18/22 1757  GLUCAP 98   Lipid Profile: No results for input(s): "CHOL", "HDL", "LDLCALC", "TRIG", "CHOLHDL", "LDLDIRECT" in the last 72 hours. Thyroid Function Tests: No results for input(s): "TSH", "T4TOTAL", "FREET4", "T3FREE", "THYROIDAB" in the last 72 hours. Anemia Panel: No results for input(s): "VITAMINB12", "FOLATE", "FERRITIN", "TIBC", "IRON", "RETICCTPCT" in  the last 72 hours. Urine analysis:    Component Value Date/Time   COLORURINE YELLOW 04/30/2011 1609   APPEARANCEUR CLEAR 04/30/2011 1609   LABSPEC 1.008 04/30/2011 1609   PHURINE 7.5 04/30/2011 1609   GLUCOSEU NEGATIVE 04/30/2011 1609   HGBUR TRACE (A) 04/30/2011 1609   BILIRUBINUR NEGATIVE 04/30/2011 1609   KETONESUR NEGATIVE 04/30/2011 1609   PROTEINUR 30 (A) 04/30/2011 1609   UROBILINOGEN 0.2 04/30/2011 1609   NITRITE NEGATIVE 04/30/2011 1609   LEUKOCYTESUR NEGATIVE 04/30/2011 1609   Sepsis Labs: @LABRCNTIP (procalcitonin:4,lacticidven:4) )No results found for this or any previous visit (from the past 240 hour(s)).   Radiological Exams on Admission: No results found.  Assessment/Plan   1. Myositis  - Pt with months of progressive weakness and myalgias was found to have CK 6777, aldolase 83, and positive ANA    - He was directly admitted to facilitate expedited muscle biopsy and initiation of treatment  - Consult surgery in  am for muscle biopsy    2. Elevated transaminases  - ALT was 205 and AST 176 in December 2023 with normal alk phos and bili  - He saw GI for this and had CT with no acute hepatobiliary findings  - Likely secondary to myositis +/- alcohol   3. CAD  - Hx of remote BMS to LAD - Denies chest pain   - Continue Plavix and beta-blocker    4. Frequent PACs - Continue Toprol   5. Hypothyroidism   - Continue levothyroxine     DVT prophylaxis: Lovenox  Code Status: Full  Level of Care: Level of care: Med-Surg Family Communication: None present   Disposition Plan:  Patient is from: Home  Anticipated d/c is to: Home  Anticipated d/c date is: 03/21/22 Patient currently: Pending muscle biopsy, initiation of myositis treatment  Consults called: none  Admission status: Inpatient     Vianne Bulls, MD [facility]  03/18/2022, 7:51 PM

## 2022-03-18 NOTE — Patient Instructions (Signed)
Thank you for visiting Dr. Valeta Harms at [facility]. Today we recommend the following:  Orders Placed This Encounter  Procedures   Admit to Inpatient (patient's expected length of stay will be greater than 2 midnights or inpatient only procedure)   Return if symptoms worsen or fail to improve.    Please do your part to reduce the spread of COVID-19.

## 2022-03-18 NOTE — Progress Notes (Signed)
Synopsis: Referred in January 2024 for dyspnea on exertion by [name], MD  Subjective:   PATIENT ID: Sean [name] GENDER: male DOB: 06/15/1950, MRN: [id number]  Chief Complaint  Patient presents with   Follow-up    This is a 72 year old gentleman, history of alcohol abuse, coronary artery disease, depression, hypertension, hyperlipidemia.  Patient is a former smoker quit in 19 90, 34-pack-year history. He trys to walk a lot. He has dogs that he walks regularly. Over the past 4 months he has had increased sob and fatigue. Distances and inclines make walking worse. He saw cardiology and they did several studies and everything seemed fine. He was sent to GI for evaluation for elevated LFTs, still drinking alcohol daily.  He feels like he is becoming weaker and weaker over the past couple of months.  He is finding that he is having pains in his muscles and when he describes this he points to locations in large muscle groups and side of the thigh as well as shoulders.  He is also noticed ongoing weight loss.  He also has recurrent episodes of Raynaud's of the hands.  OV 03/18/2022: Here today for evaluation of recent abnormal labs.  His CK is elevated, aldolase elevated, ANA titer was positive.  For some reason his myositis antibody panel was canceled.       Past Medical History:  Diagnosis Date   Alcohol addiction ([place])    2010- admitted for withdrawal , detox admission after that   Arrhythmia    Coronary artery disease    95% mid LAD s/p BMS of LAD with aneurysmal segment   Depression    ED (erectile dysfunction)    HLD (hyperlipidemia)    HTN (hypertension)    Mental disorder    Peptic ulcer    RBBB 10/24/2015     Family History  Problem Relation Age of Onset   Prostate cancer Father    Heart attack Brother 69   Anesthesia problems Neg Hx    Hypotension Neg Hx    Malignant hyperthermia Neg Hx    Pseudochol deficiency Neg Hx    Colon cancer Neg Hx    Stomach cancer  Neg Hx    Esophageal cancer Neg Hx    Colon polyps Neg Hx      Past Surgical History:  Procedure Laterality Date   CORONARY STENT PLACEMENT      Social History   Socioeconomic History   Marital status: Married    Spouse name: Not on file   Number of children: 2   Years of education: Not on file   Highest education level: Not on file  Occupational History   Occupation: Retired Recruitment consultant  Tobacco Use   Smoking status: Former    Types: Cigarettes    Quit date: 1990    Years since quitting: 34.1   Smokeless tobacco: Never  Vaping Use   Vaping Use: Never used  Substance and Sexual Activity   Alcohol use: Yes    Comment: quit 07/2006   Drug use: No   Sexual activity: Yes    Birth control/protection: None    Comment: Married  Other Topics Concern   Not on file  Social History Narrative   Live with wife in [place], is an Chief Financial Officer, currently driving trucks for a local firm. Quit smoking 1990 after smoking for 20 years. Drinks heavilyu (1 bottle of vodka or 18-24 beers a day). Used to smoke marijuana but quit in 1990s. Has united healthcare  insurance. Goes to [facility] [facility] [employer] for alcohol cessation   Social Determinants of Health   Financial Resource Strain: Not on file  Food Insecurity: Not on file  Transportation Needs: Not on file  Physical Activity: Not on file  Stress: Not on file  Social Connections: Not on file  Intimate Partner Violence: Not on file     No Known Allergies   Outpatient Medications Prior to Visit  Medication Sig Dispense Refill   clopidogrel (PLAVIX) 75 MG tablet Take 1 tablet (75 mg total) by mouth daily. 90 tablet 3   Evolocumab (REPATHA SURECLICK) 062 MG/ML SOAJ Inject 140 mg into the skin every 14 (fourteen) days. 2 mL 11   furosemide (LASIX) 20 MG tablet Take 1 tablet (20 mg total) by mouth as needed for fluid or edema. 90 tablet 3   lisinopril (ZESTRIL) 20 MG tablet Take 1 tablet (20 mg total) by mouth daily. 90 tablet 3   metoprolol  succinate (TOPROL-XL) 50 MG 24 hr tablet Take 1 tablet (50 mg total) by mouth every evening. Take with or immediately following a meal. 90 tablet 3   Multiple Vitamin (MULITIVITAMIN WITH MINERALS) TABS Take 1 tablet by mouth daily.     nitroGLYCERIN (NITROSTAT) 0.4 MG SL tablet Place 1 tablet (0.4 mg total) under the tongue every 5 (five) minutes as needed for chest pain. 25 tablet 3   potassium chloride (KLOR-CON M) 10 MEQ tablet Take 1 tablet (10 mEq total) by mouth daily. 90 tablet 3   No facility-administered medications prior to visit.    Review of Systems  Constitutional:  Negative for chills, fever, malaise/fatigue and weight loss.  HENT:  Negative for hearing loss, sore throat and tinnitus.   Eyes:  Negative for blurred vision and double vision.  Respiratory:  Negative for cough, hemoptysis, sputum production, shortness of breath, wheezing and stridor.   Cardiovascular:  Positive for leg swelling. Negative for chest pain, palpitations, orthopnea and PND.  Gastrointestinal:  Negative for abdominal pain, constipation, diarrhea, heartburn, nausea and vomiting.  Genitourinary:  Negative for dysuria, hematuria and urgency.  Musculoskeletal:  Positive for myalgias. Negative for joint pain.  Skin:  Negative for itching and rash.  Neurological:  Negative for dizziness, tingling, weakness and headaches.  Endo/Heme/Allergies:  Negative for environmental allergies. Does not bruise/bleed easily.  Psychiatric/Behavioral:  Negative for depression. The patient is not nervous/anxious and does not have insomnia.   All other systems reviewed and are negative.    Objective:  Physical Exam Vitals reviewed.  Constitutional:      General: He is not in acute distress.    Appearance: He is well-developed.  HENT:     Head: Normocephalic and atraumatic.  Eyes:     General: No scleral icterus.    Conjunctiva/sclera: Conjunctivae normal.     Pupils: Pupils are equal, round, and reactive to light.   Neck:     Vascular: No JVD.     Trachea: No tracheal deviation.  Cardiovascular:     Rate and Rhythm: Normal rate and regular rhythm.     Heart sounds: Normal heart sounds. No murmur heard. Pulmonary:     Effort: Pulmonary effort is normal. No tachypnea, accessory muscle usage or respiratory distress.     Breath sounds: No stridor. No wheezing, rhonchi or rales.  Abdominal:     General: There is no distension.     Palpations: Abdomen is soft.     Tenderness: There is no abdominal tenderness.  Musculoskeletal:  General: No tenderness.     Cervical back: Neck supple.     Right lower leg: Edema present.     Left lower leg: Edema present.  Lymphadenopathy:     Cervical: No cervical adenopathy.  Skin:    General: Skin is warm and dry.     Capillary Refill: Capillary refill takes less than 2 seconds.     Findings: No rash.  Neurological:     Mental Status: He is alert and oriented to person, place, and time.  Psychiatric:        Behavior: Behavior normal.      Vitals:   03/18/22 1111  BP: 110/70  Pulse: 83  SpO2: 95%  Weight: 199 lb (90.3 kg)  Height: 5\' 8"  (1.727 m)   95% on RA BMI Readings from Last 3 Encounters:  03/18/22 30.26 kg/m  03/11/22 30.65 kg/m  02/12/22 29.17 kg/m   Wt Readings from Last 3 Encounters:  03/18/22 199 lb (90.3 kg)  03/11/22 201 lb 9.6 oz (91.4 kg)  02/12/22 206 lb 3.2 oz (93.5 kg)     CBC    Component Value Date/Time   WBC 4.3 05/09/2011 1932   RBC 3.69 (L) 05/09/2011 1932   HGB 11.7 (L) 05/09/2011 1932   HCT 36.1 (L) 05/09/2011 1932   PLT 319 05/09/2011 1932   MCV 97.8 05/09/2011 1932   MCH 31.7 05/09/2011 1932   MCHC 32.4 05/09/2011 1932   RDW 13.2 05/09/2011 1932   LYMPHSABS 0.3 (L) 04/29/2011 0856   MONOABS 0.2 04/29/2011 0856   EOSABS 0.0 04/29/2011 0856   BASOSABS 0.0 04/29/2011 0856     Chest Imaging:  02/11/2022: CT abdomen pelvis enlarged prostate.  Lung fields appear clear. The patient's images have  been independently reviewed by me.     Pulmonary Functions Testing Results:     No data to display          FeNO:   Pathology:   Echocardiogram:   Nuclear medicine PET/CT perfusion study   The study is normal. The study is low risk.   LV perfusion is normal. There is no evidence of ischemia. There is no evidence of infarction.   Rest left ventricular function is normal. Rest EF: 60 %. Stress left ventricular function is normal. Stress EF: 67 %. End diastolic cavity size is normal. End systolic cavity size is normal. No evidence of transient ischemic dilation (TID) noted.   Myocardial blood flow was computed to be 1.23ml/g/min at rest and 2.54ml/g/min at stress. Global myocardial blood flow reserve was 1.74. With stress flows of 2 ml/g/min, MBF is likely normal and CFR is low due to higher resting flow. Overall, findings are normal.   Coronary calcium assessment not performed due to prior revascularization.   Electronically signed by: 80m, MD  Heart Catheterization:     Assessment & Plan:     ICD-10-CM   1. Myositis of lower extremity, unspecified laterality, unspecified myositis type  M60.9 Admit to Inpatient (patient's expected length of stay will be greater than 2 midnights or inpatient only procedure)      Discussion:  This is a 72 year old gentleman with progressive dyspnea and associated myalgias with known Raynaud's phenomenon of the hands.  Lab abnormalities positive for an elevated CK, elevated aldolase, positive ANA titer.  I am concerned that he has ongoing acute myositis and likely of inflammatory nature.  His myositis specific antibody panel was canceled I am going to follow-up on why this was.  Plan:  I think he needs a more expedited workup. Would benefit from having a muscle biopsy. We are going to work on direct admitting him to the hospital to expedite muscle biopsy as well as start of immune suppression. Would benefit from IV Solu-Medrol for  couple of days. He will need rheumatology evaluation once he has his myositis antibodies complete and muscle biopsy complete. We will work today to get his inpatient admission arranged.   Current Outpatient Medications:    clopidogrel (PLAVIX) 75 MG tablet, Take 1 tablet (75 mg total) by mouth daily., Disp: 90 tablet, Rfl: 3   Evolocumab (REPATHA SURECLICK) 935 MG/ML SOAJ, Inject 140 mg into the skin every 14 (fourteen) days., Disp: 2 mL, Rfl: 11   furosemide (LASIX) 20 MG tablet, Take 1 tablet (20 mg total) by mouth as needed for fluid or edema., Disp: 90 tablet, Rfl: 3   lisinopril (ZESTRIL) 20 MG tablet, Take 1 tablet (20 mg total) by mouth daily., Disp: 90 tablet, Rfl: 3   metoprolol succinate (TOPROL-XL) 50 MG 24 hr tablet, Take 1 tablet (50 mg total) by mouth every evening. Take with or immediately following a meal., Disp: 90 tablet, Rfl: 3   Multiple Vitamin (MULITIVITAMIN WITH MINERALS) TABS, Take 1 tablet by mouth daily., Disp: , Rfl:    nitroGLYCERIN (NITROSTAT) 0.4 MG SL tablet, Place 1 tablet (0.4 mg total) under the tongue every 5 (five) minutes as needed for chest pain., Disp: 25 tablet, Rfl: 3   potassium chloride (KLOR-CON M) 10 MEQ tablet, Take 1 tablet (10 mEq total) by mouth daily., Disp: 90 tablet, Rfl: 3  I spent 42 minutes dedicated to the care of this patient on the date of this encounter to include pre-visit review of records, face-to-face time with the patient discussing conditions above, post visit ordering of testing, clinical documentation with the electronic health record, making appropriate referrals as documented, and communicating necessary findings to members of the patients care team.    Garner Nash, DO [facility] 03/18/2022 11:43 AM

## 2022-03-19 ENCOUNTER — Inpatient Hospital Stay (HOSPITAL_COMMUNITY): Payer: PPO

## 2022-03-19 ENCOUNTER — Encounter (HOSPITAL_COMMUNITY): Payer: Self-pay | Admitting: Family Medicine

## 2022-03-19 DIAGNOSIS — M6089 Other myositis, multiple sites: Secondary | ICD-10-CM | POA: Diagnosis not present

## 2022-03-19 DIAGNOSIS — M609 Myositis, unspecified: Secondary | ICD-10-CM | POA: Diagnosis not present

## 2022-03-19 LAB — COMPREHENSIVE METABOLIC PANEL
ALT: 175 U/L — ABNORMAL HIGH (ref 0–44)
AST: 212 U/L — ABNORMAL HIGH (ref 15–41)
Albumin: 2.8 g/dL — ABNORMAL LOW (ref 3.5–5.0)
Alkaline Phosphatase: 47 U/L (ref 38–126)
Anion gap: 9 (ref 5–15)
BUN: 41 mg/dL — ABNORMAL HIGH (ref 8–23)
CO2: 27 mmol/L (ref 22–32)
Calcium: 8.5 mg/dL — ABNORMAL LOW (ref 8.9–10.3)
Chloride: 102 mmol/L (ref 98–111)
Creatinine, Ser: 1.01 mg/dL (ref 0.61–1.24)
GFR, Estimated: >60 mL/min (ref >60)
Glucose, Bld: 97 mg/dL (ref 70–99)
Potassium: 3.4 mmol/L — ABNORMAL LOW (ref 3.5–5.1)
Sodium: 138 mmol/L (ref 135–145)
Total Bilirubin: 0.7 mg/dL (ref 0.3–1.2)
Total Protein: 5.6 g/dL — ABNORMAL LOW (ref 6.5–8.1)

## 2022-03-19 LAB — CBC
HCT: 41.3 % (ref 39.0–52.0)
Hemoglobin: 13.6 g/dL (ref 13.0–17.0)
MCH: 30.8 pg (ref 26.0–34.0)
MCHC: 32.9 g/dL (ref 30.0–36.0)
MCV: 93.4 fL (ref 80.0–100.0)
Platelets: 277 10*3/uL (ref 150–400)
RBC: 4.42 MIL/uL (ref 4.22–5.81)
RDW: 14.2 % (ref 11.5–15.5)
WBC: 5.9 10*3/uL (ref 4.0–10.5)
nRBC: 0 % (ref 0.0–0.2)

## 2022-03-19 MED ORDER — ENSURE ENLIVE PO LIQD
237.0000 mL | ORAL | Status: DC
  Administered 2022-03-20 – 2022-03-21 (×2): 237 mL via ORAL
  Filled 2022-03-19 (×3): qty 237

## 2022-03-19 MED ORDER — SODIUM CHLORIDE 0.9 % IV SOLN
1000.0000 mg | INTRAVENOUS | Status: DC
  Administered 2022-03-19 – 2022-03-21 (×3): 1000 mg via INTRAVENOUS
  Filled 2022-03-19 (×4): qty 16

## 2022-03-19 MED ORDER — MUPIROCIN 2 % EX OINT
1.0000 | TOPICAL_OINTMENT | Freq: Two times a day (BID) | CUTANEOUS | Status: DC
  Administered 2022-03-19 – 2022-03-23 (×9): 1 via NASAL
  Filled 2022-03-19 (×2): qty 22

## 2022-03-19 MED ORDER — POTASSIUM CHLORIDE CRYS ER 20 MEQ PO TBCR
40.0000 meq | EXTENDED_RELEASE_TABLET | Freq: Once | ORAL | Status: AC
  Administered 2022-03-19: 40 meq via ORAL
  Filled 2022-03-19: qty 2

## 2022-03-19 MED ORDER — PANTOPRAZOLE SODIUM 40 MG IV SOLR
40.0000 mg | INTRAVENOUS | Status: DC
  Administered 2022-03-19 – 2022-03-21 (×3): 40 mg via INTRAVENOUS
  Filled 2022-03-19 (×3): qty 10

## 2022-03-19 MED ORDER — CEFAZOLIN SODIUM-DEXTROSE 2-4 GM/100ML-% IV SOLN: 2.0000 g | INTRAVENOUS | Status: DC

## 2022-03-19 MED ORDER — METHYLPREDNISOLONE SODIUM SUCC 125 MG IJ SOLR: 90.0000 mg | Freq: Every day | INTRAMUSCULAR | Status: DC

## 2022-03-19 MED ORDER — GADOBUTROL 1 MMOL/ML IV SOLN
9.0000 mL | Freq: Once | INTRAVENOUS | Status: AC | PRN
  Administered 2022-03-19: 9 mL via INTRAVENOUS

## 2022-03-19 MED ORDER — SODIUM CHLORIDE 0.9 % IV SOLN
1000.0000 mg | Freq: Every day | INTRAVENOUS | Status: DC
  Filled 2022-03-19: qty 16

## 2022-03-19 MED ORDER — PREDNISONE 20 MG PO TABS: 40.0000 mg | ORAL_TABLET | Freq: Two times a day (BID) | ORAL | Status: DC

## 2022-03-19 NOTE — Progress Notes (Signed)
Patient came to MRI without IV access for a contrasted study.  After talking with charge nurse, informed that patient's nurse stated that patient had IV access and it was charted.  Patient very alert and stated that he never had and IV.  IV access started in MRI  department by MRI Technologist.

## 2022-03-19 NOTE — Progress Notes (Addendum)
Initial Nutrition Assessment  DOCUMENTATION CODES:   Not applicable  INTERVENTION:  Continue liberalized regular diet. Encouraged patient to continue choosing a source of protein at each meal.  Provide Ensure Enlive po once daily, each supplement provides 350 kcal and 20 grams of protein.  Recommend measuring weight this admission.  NUTRITION DIAGNOSIS:   Unintentional weight loss related to  (suspected inadequate intake to meet estimated needs) as evidenced by  (8% weight loss over 3 months).  GOAL:   Patient will meet greater than or equal to 90% of their needs  MONITOR:   PO intake, Supplement acceptance, Labs, Weight trends, I & O's  REASON FOR ASSESSMENT:   Malnutrition Screening Tool    ASSESSMENT:   72 year old male with PMHx of HTN, HLD, CAD s/p BMS to LAD, prior EtOH dependence, elevated liver enzymes who is admitted with months of progressive weakness and myalgias and was found to have CK 6777, aldolase 83, positive ANA. Plan for expedited muscle biopsy and initiation of myositis treatment.  Met with pt at bedside. He reports his appetite is good and unchanged from baseline. He does endorse that since October he has been drinking less beer, which he feels may have contributed to unplanned weight loss. He would occasionally have a drink prior to October (0-1/day) and reports now does not drink at all. Patient reports he prepares a majority of the meals at home and his family follows a Mediterranean-style diet pattern. He typically eats 3 meals per day. For breakfast he may have eggs with toast or cereal or pancakes or omelet. For lunch he has salad with protein. For dinner he has a protein with sides. He denies food allergies or intolerances. Denies nausea, emesis, abdominal pain, or difficulty chewing/swallowing. Reports his intake has not changed since October except for drinking less beer. Discussed importance of adequate intake of calories and protein to help prevent  further unplanned weight loss. Pt is amenable to drinking Ensure Enlive/Ensure Plus High Protein once daily as oral nutrition supplement (no flavor preference).  Pt reports his appetite has been good so far this admission and he is eating 100% at meals.   Pt reports his weight has fluctuated over the years. His lowest weight was <180 lbs about 6 years ago. He then gained to around 190-200 lbs. During the pandemic, he reports gaining about 1 lb per month and UBW since then was 220-225 lbs. He reports since October 2023 he has had unplanned weight loss. Per review of chart pt was 98.2 kg on 12/16/21. Current weight in chart is 90.3 kg (199 lbs). Per chart pt has lost 7.9 kg or 8% weight over the past 3 months, which is significant for time frame. However, it appears weight taken on admission was stated and not truly measured. Recommend obtaining accurate weight.  Medications reviewed and include: levothyroxine, lisinopril, Solu-Medrol, metoprolol  Labs reviewed: Potassium 3.4, BUN 41, AST 212, ALT 175  UOP: none documented since admission (admitted <24 hours)  I/O: +360 mL since admission  Pt is at risk for malnutrition based on weight loss. However, would recommend obtaining an accurate wt this admission as it appears admission wt was stated. No findings of muscle depletion or fat depletion on NFPE. Pt reports appetite is good and unchanged from baseline.  NUTRITION - FOCUSED PHYSICAL EXAM:  Flowsheet Row Most Recent Value  Orbital Region No depletion  Upper Arm Region No depletion  Thoracic and Lumbar Region No depletion  Buccal Region No depletion  Temple Region No depletion  Clavicle Bone Region No depletion  Clavicle and Acromion Bone Region No depletion  Scapular Bone Region No depletion  Dorsal Hand No depletion  Patellar Region No depletion  Anterior Thigh Region No depletion  Posterior Calf Region No depletion  Edema (RD Assessment) Mild  Hair Reviewed  Eyes Reviewed  Mouth  Reviewed  Skin Reviewed  Nails Reviewed       Diet Order:   Diet Order             Diet NPO time specified Except for: Sips with Meds  Diet effective midnight           Diet regular Fluid consistency: Thin  Diet effective now                  EDUCATION NEEDS:   Education needs have been addressed (Encouraged adequate intake of calories and protein.)  Skin:  Skin Assessment: Reviewed RN Assessment  Last BM:  03/18/22  Height:   Ht Readings from Last 1 Encounters:  03/18/22 5\' 8"  (1.727 m)   Weight:   Wt Readings from Last 1 Encounters:  03/18/22 90.3 kg   Ideal Body Weight:  70 kg  BMI:  Body mass index is 30.26 kg/m.  Estimated Nutritional Needs:   Kcal:  2100-2300  Protein:  105-115 grams  Fluid:  2.1-2.3 L/day  Loanne Drilling, MS, RD, LDN, CNSC Pager number available on Amion

## 2022-03-19 NOTE — Consult Note (Signed)
[name] Feb 17, 1950  [id number].    Requesting MD: Dr. Antonieta Pert Chief Complaint/Reason for Consult: muscle biospy  HPI:  This is a 72 yo white male with a history of CAD, s/p stenting 15 yrs ago on plavix, previous heavy ETOH abuse, HTN, HLD, who has had progressive weakness of multiple muscle groups over the last several months, since around October.  He states that initially he started having trouble walking 100 steps.  He was sent to multiple specialties such as cardiology, GI, etc with all negative work ups.  He denies SOB and states his inability now to walk more than 20ish feet without stopping is related to fatigue and not breathing issues.  He was sent to pulmonary recently for further evaluation though.  He was noted to have a significant elevation in his CK of 6700. He was admitted to expedite muscle biopsy request.  We have been asked to see him today for this procedure.  He does take Plavix and his last dose was yesterday 1/31.    ROS: ROS: see HPI  Family History  Problem Relation Age of Onset   Prostate cancer Father    Heart attack Brother 46   Anesthesia problems Neg Hx    Hypotension Neg Hx    Malignant hyperthermia Neg Hx    Pseudochol deficiency Neg Hx    Colon cancer Neg Hx    Stomach cancer Neg Hx    Esophageal cancer Neg Hx    Colon polyps Neg Hx     Past Medical History:  Diagnosis Date   Alcohol addiction ([place])    2010- admitted for withdrawal , detox admission after that   Arrhythmia    Coronary artery disease    95% mid LAD s/p BMS of LAD with aneurysmal segment   Depression    ED (erectile dysfunction)    HLD (hyperlipidemia)    HTN (hypertension)    Mental disorder    Peptic ulcer    RBBB 10/24/2015    Past Surgical History:  Procedure Laterality Date   CORONARY STENT PLACEMENT      Social History:  reports that he quit smoking about 34 years ago. His smoking use included cigarettes. He has never used smokeless tobacco. He reports  current alcohol use. He reports that he does not use drugs.  Allergies: No Known Allergies  Medications Prior to Admission  Medication Sig Dispense Refill   clopidogrel (PLAVIX) 75 MG tablet Take 1 tablet (75 mg total) by mouth daily. 90 tablet 3   Evolocumab (REPATHA SURECLICK) 673 MG/ML SOAJ Inject 140 mg into the skin every 14 (fourteen) days. 2 mL 11   furosemide (LASIX) 20 MG tablet Take 1 tablet (20 mg total) by mouth as needed for fluid or edema. (Patient taking differently: Take 20 mg by mouth daily.) 90 tablet 3   ibuprofen (ADVIL) 200 MG tablet Take 200 mg by mouth 2 (two) times daily as needed for headache, mild pain or moderate pain.     levothyroxine (SYNTHROID) 25 MCG tablet Take 25 mcg by mouth daily in the afternoon.     lisinopril (ZESTRIL) 20 MG tablet Take 1 tablet (20 mg total) by mouth daily. 90 tablet 3   MELATONIN PO Take 1 tablet by mouth at bedtime as needed (sleep).     metoprolol succinate (TOPROL-XL) 50 MG 24 hr tablet Take 1 tablet (50 mg total) by mouth every evening. Take with or immediately following a meal. (Patient taking differently: Take  50 mg by mouth every evening.) 90 tablet 3   Multiple Vitamin (MULITIVITAMIN WITH MINERALS) TABS Take 1 tablet by mouth daily.     nitroGLYCERIN (NITROSTAT) 0.4 MG SL tablet Place 1 tablet (0.4 mg total) under the tongue every 5 (five) minutes as needed for chest pain. 25 tablet 3   potassium chloride (KLOR-CON M) 10 MEQ tablet Take 1 tablet (10 mEq total) by mouth daily. 90 tablet 3     Physical Exam: Blood pressure (!) 126/56, pulse 88, temperature 97.6 F (36.4 C), temperature source Oral, resp. rate 17, height 5\' 8"  (1.727 m), weight 90.3 kg, SpO2 98 %. General: pleasant, WD, WN white male who is sitting up in a chair in NAD HEENT: head is normocephalic, atraumatic.  Sclera are noninjected.  PERRL.  Ears and nose without any masses or lesions.  Mouth is pink and moist Heart: regular, rate, and rhythm.  Normal s1,s2. No  obvious murmurs, gallops, or rubs noted.   Lungs: CTAB, no wheezes, rhonchi, or rales noted.  Respiratory effort nonlabored Abd: soft, NT, ND, +BS, no masses, hernias, or organomegaly MS: all 4 extremities are symmetrical with no cyanosis, clubbing, or edema. Psych: A&Ox3 with an appropriate affect.   Results for orders placed or performed during the hospital encounter of 03/18/22 (from the past 48 hour(s))  Glucose, capillary     Status: None   Collection Time: 03/18/22  5:57 PM  Result Value Ref Range   Glucose-Capillary 98 70 - 99 mg/dL    Comment: Glucose reference range applies only to samples taken after fasting for at least 8 hours.  Comprehensive metabolic panel     Status: Abnormal   Collection Time: 03/18/22  8:02 PM  Result Value Ref Range   Sodium 139 135 - 145 mmol/L   Potassium 3.8 3.5 - 5.1 mmol/L   Chloride 102 98 - 111 mmol/L   CO2 28 22 - 32 mmol/L   Glucose, Bld 99 70 - 99 mg/dL    Comment: Glucose reference range applies only to samples taken after fasting for at least 8 hours.   BUN 37 (H) 8 - 23 mg/dL   Creatinine, Ser 1.07 0.61 - 1.24 mg/dL   Calcium 9.0 8.9 - 10.3 mg/dL   Total Protein 6.2 (L) 6.5 - 8.1 g/dL   Albumin 3.3 (L) 3.5 - 5.0 g/dL   AST 255 (H) 15 - 41 U/L   ALT 215 (H) 0 - 44 U/L   Alkaline Phosphatase 59 38 - 126 U/L   Total Bilirubin 0.5 0.3 - 1.2 mg/dL   GFR, Estimated >60 >60 mL/min    Comment: (NOTE) Calculated using the CKD-EPI Creatinine Equation (2021)    Anion gap 9 5 - 15    Comment: Performed at [facility], [place] [address], [place],  [address]  Magnesium     Status: None   Collection Time: 03/18/22  8:02 PM  Result Value Ref Range   Magnesium 2.0 1.7 - 2.4 mg/dL    Comment: Performed at [facility] [address] [place] [address]  CBC     Status: None   Collection Time: 03/18/22  8:02 PM  Result Value Ref Range   WBC 7.3 4.0 - 10.5 K/uL   RBC 4.62 4.22 - 5.81 MIL/uL   Hemoglobin 14.1 13.0 -  17.0 g/dL   HCT 43.4 39.0 - 52.0 %   MCV 93.9 80.0 - 100.0 fL   MCH 30.5 26.0 - 34.0  pg   MCHC 32.5 30.0 - 36.0 g/dL   RDW 28.4 13.2 - 44.0 %   Platelets 320 150 - 400 K/uL   nRBC 0.0 0.0 - 0.2 %    Comment: Performed at [facility], [address], [place], [place] [address]  Comprehensive metabolic panel     Status: Abnormal   Collection Time: 03/19/22  4:06 AM  Result Value Ref Range   Sodium 138 135 - 145 mmol/L   Potassium 3.4 (L) 3.5 - 5.1 mmol/L   Chloride 102 98 - 111 mmol/L   CO2 27 22 - 32 mmol/L   Glucose, Bld 97 70 - 99 mg/dL    Comment: Glucose reference range applies only to samples taken after fasting for at least 8 hours.   BUN 41 (H) 8 - 23 mg/dL   Creatinine, Ser 5.36 0.61 - 1.24 mg/dL   Calcium 8.5 (L) 8.9 - 10.3 mg/dL   Total Protein 5.6 (L) 6.5 - 8.1 g/dL   Albumin 2.8 (L) 3.5 - 5.0 g/dL   AST 644 (H) 15 - 41 U/L   ALT 175 (H) 0 - 44 U/L   Alkaline Phosphatase 47 38 - 126 U/L   Total Bilirubin 0.7 0.3 - 1.2 mg/dL   GFR, Estimated >03 >47 mL/min    Comment: (NOTE) Calculated using the CKD-EPI Creatinine Equation (2021)    Anion gap 9 5 - 15    Comment: Performed at [facility], [address], [place], [place] [address]  CBC     Status: None   Collection Time: 03/19/22  4:06 AM  Result Value Ref Range   WBC 5.9 4.0 - 10.5 K/uL   RBC 4.42 4.22 - 5.81 MIL/uL   Hemoglobin 13.6 13.0 - 17.0 g/dL   HCT 63.8 75.6 - 43.3 %   MCV 93.4 80.0 - 100.0 fL   MCH 30.8 26.0 - 34.0 pg   MCHC 32.9 30.0 - 36.0 g/dL   RDW 29.5 18.8 - 41.6 %   Platelets 277 150 - 400 K/uL   nRBC 0.0 0.0 - 0.2 %    Comment: Performed at [facility], [address] [place], [place] [address]   No results found.    Assessment/Plan Muscle weakness, elevated Ck The patient has been seen, examined, chart, labs, vitals, reviewed.  The patient has been having weakness in his quadriceps.  We will likely plan to do a muscle biopsy in this muscle group as scheduling  allows.  Hopefully we can do it tomorrow prior to the weekend pending urgent/emergent cases.  If we are unable to go this tomorrow then it will be early next week as we can't do biopsies over the weekend.  Hold Plavix for now.  Doubt we will need to delay for the typical 5 day Plavix hold.  I have discussed the procedure with the patient and he is agreeable to proceed.  Discussed with primary team at the bedside as well.  He may have a diet today and NPO p MN.   FEN - regular diet VTE - plavix, I have stopped this, may have lovenox ID - none currently needed, ancef on call to OR  CAD, s/p stent 15 yrs ago, on plavix at baseline HTN HLD H/o heavy ETOH abuse, sober for many years  I reviewed Consultant pulmonary notes, hospitalist notes, last 24 h vitals and pain scores, last 48 h intake and output, last 24 h labs and trends, and last 24  h imaging results.  Henreitta Cea, [facility] Surgery 03/19/2022, 10:55 AM Please see Amion for pager number during day hours 7:00am-4:30pm or 7:00am -11:30am on weekends

## 2022-03-19 NOTE — Hospital Course (Addendum)
72 y.o.m w/ HTN HLD, and CAD with remote PCI who presents to the hospital as direct admission for expedited muscle biopsy and initiation of myositis treatment. 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  He has extremity swelling associated with this but no fevers or rash.  He was evaluated by cardiology for his activity intolerance and had echocardiogram with normal EF and no diastolic dysfunction.He was started on Lasix for his swelling, but has not improved with this. He had elevated transaminases that were noted to be increasing, saw GI for this, and had CT of the abdomen and pelvis performed that was negative for any acute hepatobiliary finding.He has a history of alcoholism but reports just 0-1 drinks per day for the past several months.   seen at pulmonology and had workup notable for serum CK of 6777, aldolase of 83, and positive ANA with concern for acute myositis, direct admission to the hospital was arranged in order to expedite muscle biopsy and initiation of treatment.

## 2022-03-19 NOTE — Progress Notes (Addendum)
NEUROLOGY CONSULTATION NOTE   Date of service: March 19, 2022 Patient Name: Sean [name] MRN:  [id number] DOB:  1950/06/05 Reason for consult: "weakness when I walk" Requesting physician: "Dr. Jonathon Bellows" _ _ _   _ __   _ __ _ _  __ __   _ __   __ _  History of Present Illness   Sean [name] is a 72 y.o. male with PMH significant for  has a past medical history of Alcohol addiction ([facility]), Arrhythmia, Coronary artery disease, Depression, ED (erectile dysfunction), HLD (hyperlipidemia), HTN (hypertension), Mental disorder, Peptic ulcer, and RBBB (10/24/2015). who presents with progressive weakness that started around October. He initially felt the muscle weakness in his chest and abdomen when he was walking his dog. He saw his cardiologist and pulmonologist who advised him to be evaluated in the hospital. He notes difficulty walking more than a few steps on an incline. 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 He reports the motion of combing his hair difficult. He denies a recent history of respiratory or GI infections. He denies noticing any skin abnormalities like a rash. He reports drinking 1 IPA beer once a day.  He also noticed extremity swelling and weight loss, about 20 lbs since October.   ROS   Per HPI: all other systems reviewed and are negative  Past History   I have reviewed the following:  Past Medical History:  Diagnosis Date   Alcohol addiction ([facility])    2010- admitted for withdrawal , detox admission after that   Arrhythmia    Coronary artery disease    95% mid LAD s/p BMS of LAD with aneurysmal segment   Depression    ED (erectile dysfunction)    HLD (hyperlipidemia)    HTN (hypertension)    Mental disorder    Peptic ulcer    RBBB 10/24/2015   Past Surgical History:  Procedure Laterality Date   CORONARY STENT PLACEMENT     Family History  Problem Relation Age  of Onset   Prostate cancer Father    Heart attack Brother 64   Anesthesia problems Neg Hx    Hypotension Neg Hx    Malignant hyperthermia Neg Hx    Pseudochol deficiency Neg Hx    Colon cancer Neg Hx    Stomach cancer Neg Hx    Esophageal cancer Neg Hx    Colon polyps Neg Hx    Social History   Socioeconomic History   Marital status: Married    Spouse name: Not on file   Number of children: 2   Years of education: Not on file   Highest education level: Not on file  Occupational History   Occupation: Retired Midwife  Tobacco Use   Smoking status: Former    Types: Cigarettes    Quit date: 1990    Years since quitting: 34.1   Smokeless tobacco: Never  Vaping Use   Vaping Use: Never used  Substance and Sexual Activity   Alcohol use: Yes    Comment: quit 07/2006   Drug use: No   Sexual activity: Yes    Birth control/protection: None    Comment: Married  Other Topics Concern   Not on file  Social History Narrative   Live with wife in [place], is an Art gallery manager, currently driving trucks for a local firm. Quit smoking 1990 after smoking for 20 years. Drinks  heavilyu (1 bottle of vodka or 18-24 beers a day). Used to smoke marijuana but quit in 1990s. Has united healthcare insurance. Goes to [employer] for alcohol cessation   Social Determinants of Health   Financial Resource Strain: Not on file  Food Insecurity: No Food Insecurity (03/18/2022)   Hunger Vital Sign    Worried About Running Out of Food in the Last Year: Never true    Ran Out of Food in the Last Year: Never true  Transportation Needs: No Transportation Needs (03/18/2022)   PRAPARE - Hydrologist (Medical): No    Lack of Transportation (Non-Medical): No  Physical Activity: Not on file  Stress: Not on file  Social Connections: Not on file   No Known Allergies  Medications   Medications Prior to Admission  Medication Sig Dispense Refill Last Dose   clopidogrel (PLAVIX) 75  MG tablet Take 1 tablet (75 mg total) by mouth daily. 90 tablet 3 03/18/2022   Evolocumab (REPATHA SURECLICK) 562 MG/ML SOAJ Inject 140 mg into the skin every 14 (fourteen) days. 2 mL 11 Past Month   furosemide (LASIX) 20 MG tablet Take 1 tablet (20 mg total) by mouth as needed for fluid or edema. (Patient taking differently: Take 20 mg by mouth daily.) 90 tablet 3 03/18/2022   ibuprofen (ADVIL) 200 MG tablet Take 200 mg by mouth 2 (two) times daily as needed for headache, mild pain or moderate pain.   03/17/2022   levothyroxine (SYNTHROID) 25 MCG tablet Take 25 mcg by mouth daily in the afternoon.   03/18/2022   lisinopril (ZESTRIL) 20 MG tablet Take 1 tablet (20 mg total) by mouth daily. 90 tablet 3 03/18/2022   MELATONIN PO Take 1 tablet by mouth at bedtime as needed (sleep).   Past Week   metoprolol succinate (TOPROL-XL) 50 MG 24 hr tablet Take 1 tablet (50 mg total) by mouth every evening. Take with or immediately following a meal. (Patient taking differently: Take 50 mg by mouth every evening.) 90 tablet 3 03/18/2022 at 1600   Multiple Vitamin (MULITIVITAMIN WITH MINERALS) TABS Take 1 tablet by mouth daily.   03/18/2022   nitroGLYCERIN (NITROSTAT) 0.4 MG SL tablet Place 1 tablet (0.4 mg total) under the tongue every 5 (five) minutes as needed for chest pain. 25 tablet 3 NEVER   potassium chloride (KLOR-CON M) 10 MEQ tablet Take 1 tablet (10 mEq total) by mouth daily. 90 tablet 3 03/18/2022      Current Facility-Administered Medications:    acetaminophen (TYLENOL) tablet 650 mg, 650 mg, Oral, Q6H PRN **OR** acetaminophen (TYLENOL) suppository 650 mg, 650 mg, Rectal, Q6H PRN, Opyd, Ilene Qua, MD   [START ON 03/20/2022] ceFAZolin (ANCEF) IVPB 2g/100 mL premix, 2 g, Intravenous, On Call to OR, Saverio Danker, PA-C   enoxaparin (LOVENOX) injection 40 mg, 40 mg, Subcutaneous, Q24H, Opyd, Timothy S, MD, 40 mg at 03/18/22 2043   feeding supplement (ENSURE ENLIVE / ENSURE PLUS) liquid 237 mL, 237 mL, Oral,  Q24H, Kc, Ramesh, MD   HYDROcodone-acetaminophen (NORCO/VICODIN) 5-325 MG per tablet 1 tablet, 1 tablet, Oral, Q4H PRN, Opyd, Ilene Qua, MD   levothyroxine (SYNTHROID) tablet 25 mcg, 25 mcg, Oral, Q0600, Opyd, Ilene Qua, MD, 25 mcg at 03/19/22 0645   lisinopril (ZESTRIL) tablet 20 mg, 20 mg, Oral, Daily, Opyd, Timothy S, MD, 20 mg at 03/19/22 1031   metoprolol succinate (TOPROL-XL) 24 hr tablet 50 mg, 50 mg, Oral, QPM, Opyd, Ilene Qua, MD   polyethylene  glycol (MIRALAX / GLYCOLAX) packet 17 g, 17 g, Oral, Daily PRN, Opyd, Ilene Qua, MD   [START ON 03/20/2022] predniSONE (DELTASONE) tablet 40 mg, 40 mg, Oral, BID WC, Kc, Ramesh, MD  Vitals   Vitals:   03/18/22 1800 03/18/22 2050 03/19/22 0559 03/19/22 0826  BP: 105/61 126/74 114/65 (!) 126/56  Pulse: 84 80 82 88  Resp: 16 18 17    Temp: 98.6 F (37 C) 98.2 F (36.8 C) 98 F (36.7 C) 97.6 F (36.4 C)  TempSrc: Oral Oral  Oral  SpO2:  99% 98% 98%  Weight: 90.3 kg     Height: 5\' 8"  (1.727 m)        Body mass index is 30.26 kg/m.  Physical Exam   Physical Exam Gen: A&O x4, NAD HEENT: Atraumatic, normocephalic;mucous membranes moist; oropharynx clear, tongue without atrophy or fasciculations. Neck: Supple, trachea midline. Resp: CTAB, no w/r/r CV: RRR, no m/g/r; nml S1 and S2. 2+ symmetric peripheral pulses. Abd: soft/NT/ND; nabs x 4 quad Extrem: Nml bulk; no cyanosis, clubbing, or edema.  Neuro: *MS: A&O x4. Follows multi-step commands.  *Speech: fluid, nondysarthric, able to name and repeat *CN:    I: Deferred   II,III: PERRLA,    III,IV,VI: EOMI w/o nystagmus, no ptosis   V: Sensation intact from V1 to V3 to LT   VII: Eyelid closure was full.  Smile symmetric.   VIII: Hearing intact to voice   IX,X: Voice normal, palate elevates symmetrically    XI: SCM/trap 5/5 bilat   XII: Tongue protrudes midline, no atrophy or fasciculations   *Motor:   Normal bulk.  No tremor, rigidity or bradykinesia. No pronator drift.     Strength: Dlt Bic Tri WrE WrF FgS Gr HF KnF KnE PlF DoF    Left 4+ 5 4+ 5 5 5 5 4  4+ 4+ 5 5    Right 4+ 5 4+ 5 5 5 5 4  4+ 4+ 5 5    *Sensory: Intact to light touch, pinprick, temperature vibration throughout. Symmetric. Propioception intact bilat.  No double-simultaneous extinction.  *Coordination:  Finger-to-nose, heel-to-shin, rapid alternating motions were intact. *Reflexes:  2+ and symmetric throughout without clonus; toes down-going bilat *Gait: Unable to stand of seated seated when arms are crossed chest    Labs   CBC:  Recent Labs  Lab 03/18/22 2002 03/19/22 0406  WBC 7.3 5.9  HGB 14.1 13.6  HCT 43.4 41.3  MCV 93.9 93.4  PLT 320 938    Basic Metabolic Panel:  Lab Results  Component Value Date   NA 138 03/19/2022   K 3.4 (L) 03/19/2022   CO2 27 03/19/2022   GLUCOSE 97 03/19/2022   BUN 41 (H) 03/19/2022   CREATININE 1.01 03/19/2022   CALCIUM 8.5 (L) 03/19/2022   GFRNONAA >60 03/19/2022   GFRAA >90 05/04/2011   Lipid Panel:  Lab Results  Component Value Date   LDLCALC 107 (H) 01/27/2022   HgbA1c: No results found for: "HGBA1C" Urine Drug Screen:     Component Value Date/Time   LABOPIA NONE DETECTED 04/29/2011 0901   COCAINSCRNUR NONE DETECTED 04/29/2011 0901   LABBENZ NONE DETECTED 04/29/2011 0901   AMPHETMU NONE DETECTED 04/29/2011 0901   THCU NONE DETECTED 04/29/2011 0901   LABBARB NONE DETECTED 04/29/2011 0901    Alcohol Level     Component Value Date/Time   ETH 113 (H) 04/29/2011 0856     Impression   Sean [name] is a 72 y.o. male with PMH significant for  has a past medical history of Alcohol addiction ([place]), Arrhythmia, Coronary artery disease, Depression, ED (erectile dysfunction), HLD (hyperlipidemia), HTN (hypertension), Mental disorder, Peptic ulcer, and RBBB (10/24/2015). who presents with progressive weakness that started around October.  Exam concerning for proximal greater than distal BL symmetric weakness with lower extremities  worse than uppers and myalgias.  Presentation most consistent with myositis, no rash. No obvious viral prodrome before symptom onset. Likely this is polymyositis due to the lack of cutaneous findings. Could also be myositis secondary to sysytemic autoimmune process.   Myomarker 3 plus panel was ordered which evaluates for myositis specific antibodies, myositis associated antibodies along with HMGCoA reductase Ab and Anti-cN-1A Ab.  MRI Left femur to evaluate for muscle edema and identify appropriate site for muscle biopsy.  Recommendations  - Trend CK levels. - Myomarker 3 plus panel was ordered which evaluates for myositis specific antibodies, myositis associated antibodies along with HMGCoA reductase Ab and Anti-cN-1A Ab. - MRI Left femur to evaluate for muscle edema and identify appropriate site for muscle biopsy. -Agree with muscle biopsy. - Start solumedrol 1000mg  IV daily x 5 doses. Will need prolonged PO taper after IV steroids are completed. PPI whie on steroids. - EMG/NCS. ______________________________________________________________________   Thank you for the opportunity to take part in the care of this patient. If you have any further questions, please contact the neurology consultation attending.  Signed,  Coralyn Pear, MD PGY-1 Psychiatry   If 7pm- 7am, please page neurology on call as listed in [place].   NEUROHOSPITALIST ADDENDUM Performed a face to face diagnostic evaluation.   I have reviewed the contents of history and physical exam as documented by PA/ARNP/Resident and agree with above documentation.  I have discussed and formulated the above plan as documented. Edits to the note have been made as needed.  Impression/Key exam findings/Plan: Gradually progressive and symmetric proximal greater than distal limb weakness with lower extremities weaker than uppers. CK is elelvated along with + ANA. Overall, presentation concerning for either primary myositis or  myostis secondary to systemic autoimmune process. Dermatomyositis does cause elevated ANA with a cytoplasmic pattern. However, he does not have a cutaneous rash consistent with dermatomyositis. Likely this is either polymyositis or myositis secondary to systemic autoimmune disorder.  Myomarker 3 plus profile is pending, will get MRI femur to identify inflammed muscle and an appropriate site for biopsy, will start solumedrol 1000mg  daily x 5 doses, will need prolonged PO taper of steroids after that along with outpatient EMG/NCS.  Donnetta Simpers, MD [facility] [phone]   If 7pm to 7am, please call on call as listed on AMION.

## 2022-03-19 NOTE — Progress Notes (Signed)
PROGRESS NOTE Sean [name]  JOA:[id number] DOB: [birth year] DOA: 03/18/2022 PCP: [name], MD   Brief Narrative/Hospital Course: 72 y.o.m w/ HTN HLD, and CAD with remote PCI who presents to the hospital as direct admission for expedited muscle biopsy and initiation of myositis treatment. 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  He has extremity swelling associated with this but no fevers or rash.  He was evaluated by cardiology for his activity intolerance and had echocardiogram with normal EF and no diastolic dysfunction.He was started on Lasix for his swelling, but has not improved with this. He had elevated transaminases that were noted to be increasing, saw GI for this, and had CT of the abdomen and pelvis performed that was negative for any acute hepatobiliary finding.He has a history of alcoholism but reports just 0-1 drinks per day for the past several months.   seen at pulmonology and had workup notable for serum CK of 6777, aldolase of 83, and positive ANA with concern for acute myositis, direct admission to the hospital was arranged in order to expedite muscle biopsy and initiation of treatment.   Subjective: SEEN THIS AM He is on bedside chair CCS just saw for possible biopsy tomorrow if schedule allows tomorrow. C/o thigh weakness,jaw weakness. On RA  Assessment and Plan: Principal Problem:   Myositis Active Problems:   Abnormal liver enzymes   HTN (hypertension)   Coronary artery disease   Hypothyroidism   Elevated transaminase level    Myositis Months of progressive weakness and myalgias: He endorses weakness IN THIGH AREA and abdominal muscle onset of weakness in October, felt like chf- so saw cardioogy-  had no chf- progressive weakness- now using cane, weakness worse on incline, on flat surface has to stop after 5o steps. Thigh, abdomen muscle feels weak, no speehc issues, no new numbness tingling, has had Rauynad's for many years- in cold gets worse with whitisich discolaration and gets numbness in fingers- had episodes abt 40 yrs ago and has worsened quite a bit, no dysphagia or SOB. Feels swollen on feet, takes lasix.  Has had extensive workup as outpatient notable for aldolase positive CK elevated ANA positive Myomarker 3+ profile/rheumatoid factor/CCP antibody/RNP antibody collected 1/31.  Esr, crp normal, Antiscleroderma antibody negative,hypersensitivity pneumonitis negative CRP less than 1.  Discussed with Dr. Valeta Harms from pulmonary advised to start steroid.  Neurology has been consulted.    I discussed with Dr. Benjamine Mola from outpatient rheumatology clinic who reviewed his chart along with his laboratory workup,> advised to start prednisone at 40 mg twice daily tomorrow after biopsy- but if biopsy is not done rheum is okay to start steroid tomorrow,he advised patient can be discharged after biopsy and he will follow-up quickly for further management  possible immunosuppressant as outpatient.>I made ambulatory referral to Rheumatology in epic.  Discussed with surgery team are planning for muscle biopsy tomorrow if the schedule allows.  Trend CK level to monitor treatment response, symptoms improvement  may take several days.  MRI left femur ordered to isolate site for biopsy  Weight loss:Patient endorses about 20 pound weight loss since October will consult dietitian.He had CT abdomen pelvis 12/27-no acute finding-no evidence of malignancy.  He will need continued outpatient follow-up to evaluate further etiology if weight does not improve  with myositis treatment  Elevated transaminases: He saw GI for this and had CT with no acute hepatobiliary findings as outpatient  Likely secondary to  rhabdomyolysis/myositis +/- alcohol  Recent Labs  Lab 03/18/22 2002 03/19/22 0406  AST 255* 212*  ALT 215* 175*  ALKPHOS 59 47  BILITOT 0.5 0.7  PROT 6.2* 5.6*  ALBUMIN 3.3* 2.8*  PLT 320 277      Latest Ref Rng & Units 01/28/2022   11:24 AM  Hepatitis  Hep B Surface Ag NON-REACTIVE NON-REACTIVE   Hep C Ab NON-REACTIVE NON-REACTIVE    Hypokalemia repleting po. Recent Labs  Lab 03/18/22 2002 03/19/22 0406  K 3.8 3.4*    CAD hx w/  remote BMS to LAD Edema of lower extremities and upper extremities: No chest pain. On Plavix and beta-blocker.  Reports she was on Lasix PRN, holding.  Check BMP  Frequent PACs: cont metoprolol.    Hypothyroidism continue Synthroid   Class I Obesity:Patient's Body mass index is 30.26 kg/m. : Will benefit with PCP follow-up, weight loss  healthy lifestyle and outpatient sleep evaluation.   DVT prophylaxis: enoxaparin (LOVENOX) injection 40 mg Start: 03/18/22 2100 Code Status:   Code Status: Full Code Family Communication: plan of care discussed with patient at bedside. Patient status is: inpatient  because of progressive weakness/myositis Level of care: Med-Surg   Dispo: The patient is from: home            Anticipated disposition: TBD Objective: Vitals last 24 hrs: Vitals:   03/18/22 1800 03/18/22 2050 03/19/22 0559 03/19/22 0826  BP: 105/61 126/74 114/65 (!) 126/56  Pulse: 84 80 82 88  Resp: 16 18 17    Temp: 98.6 F (37 C) 98.2 F (36.8 C) 98 F (36.7 C) 97.6 F (36.4 C)  TempSrc: Oral Oral  Oral  SpO2:  99% 98% 98%  Weight: 90.3 kg     Height: 5\' 8"  (1.727 m)      Weight change:   Physical Examination: General exam: alert awake, older than stated age HEENT:Oral mucosa moist, Ear/Nose WNL grossly Respiratory system: bilaterally clear BS, no use of accessory muscle Cardiovascular system: S1 & S2 +, No JVD. Gastrointestinal system: Abdomen soft,NT,ND, BS+ Nervous System:Alert, awake, moving extremities.  Extremities: LE  edema mild, distal peripheral pulses palpable.  Small joints of hand wrist, elbow or shoulder nontender.  Weakness of the thigh muscle, strong distal leg muscle. Skin: No rashes noted,no icterus. MSK: Normal muscle bulk,tone, power  Medications reviewed:  Scheduled Meds:  enoxaparin (LOVENOX) injection  40 mg Subcutaneous Q24H   feeding supplement  237 mL Oral Q24H   levothyroxine  25 mcg Oral Q0600   lisinopril  20 mg Oral Daily   metoprolol succinate  50 mg Oral QPM   [START ON 03/20/2022] predniSONE  40 mg Oral BID WC   Continuous Infusions:  [START ON 03/20/2022]  ceFAZolin (ANCEF) IV      Diet Order             Diet NPO time specified Except for: Sips with Meds  Diet effective midnight           Diet regular Fluid consistency: Thin  Diet effective now                   Intake/Output Summary (Last 24 hours) at 03/19/2022 1342 Last data filed at 03/19/2022 0907 Gross per 24 hour  Intake 360 ml  Output --  Net 360 ml   Net IO Since  Admission: 360 mL [03/19/22 1342]  Wt Readings from Last 3 Encounters:  03/18/22 90.3 kg  03/18/22 90.3 kg  03/11/22 91.4 kg     Unresulted Labs (From admission, onward)     Start     Ordered   03/25/22 0500  Creatinine, serum  (enoxaparin (LOVENOX)    CrCl >/= 30 ml/min)  Weekly,   R     Comments: while on enoxaparin therapy    03/18/22 1950   03/20/22 0500  CK  Daily,   R      03/19/22 1128   03/20/22 0500  Brain natriuretic peptide  Tomorrow morning,   R        03/19/22 1128   03/18/22 1951  Comprehensive metabolic panel  Daily,   R      03/18/22 1950   03/18/22 1951  CBC  Daily,   R      03/18/22 1950          Data Reviewed: I have personally reviewed following labs and imaging studies CBC: Recent Labs  Lab 03/18/22 2002 03/19/22 0406  WBC 7.3 5.9  HGB 14.1 13.6  HCT 43.4 41.3  MCV 93.9 93.4  PLT 320 325   Basic Metabolic Panel: Recent Labs  Lab 03/18/22 2002 03/19/22 0406  NA 139 138  K 3.8 3.4*  CL 102 102   CO2 28 27  GLUCOSE 99 97  BUN 37* 41*  CREATININE 1.07 1.01  CALCIUM 9.0 8.5*  MG 2.0  --    GFR: Estimated Creatinine Clearance: 73.3 mL/min (by C-G formula based on SCr of 1.01 mg/dL). Liver Function Tests: Recent Labs  Lab 03/18/22 2002 03/19/22 0406  AST 255* 212*  ALT 215* 175*  ALKPHOS 59 47  BILITOT 0.5 0.7  PROT 6.2* 5.6*  ALBUMIN 3.3* 2.8*   Recent Labs  Lab 03/18/22 1757  GLUCAP 98   Antimicrobials: Anti-infectives (From admission, onward)    Start     Dose/Rate Route Frequency Ordered Stop   03/20/22 0600  ceFAZolin (ANCEF) IVPB 2g/100 mL premix        2 g 200 mL/hr over 30 Minutes Intravenous On call to O.R. 03/19/22 1129 03/21/22 0559      Culture/Microbiology No results found for: "SDES", "SPECREQUEST", "CULT", "REPTSTATUS"  Other culture-see note  Radiology Studies: No results found.   LOS: 1 day   Antonieta Pert, MD [facility]  03/19/2022, 1:42 PM

## 2022-03-20 ENCOUNTER — Encounter (HOSPITAL_COMMUNITY): Payer: Self-pay | Admitting: Anesthesiology

## 2022-03-20 DIAGNOSIS — M6089 Other myositis, multiple sites: Secondary | ICD-10-CM

## 2022-03-20 LAB — CBC
HCT: 47.1 % (ref 39.0–52.0)
Hemoglobin: 14.8 g/dL (ref 13.0–17.0)
MCH: 29.5 pg (ref 26.0–34.0)
MCHC: 31.4 g/dL (ref 30.0–36.0)
MCV: 94 fL (ref 80.0–100.0)
Platelets: 337 10*3/uL (ref 150–400)
RBC: 5.01 MIL/uL (ref 4.22–5.81)
RDW: 14.1 % (ref 11.5–15.5)
WBC: 6 10*3/uL (ref 4.0–10.5)
nRBC: 0 % (ref 0.0–0.2)

## 2022-03-20 LAB — COMPREHENSIVE METABOLIC PANEL
ALT: 207 U/L — ABNORMAL HIGH (ref 0–44)
AST: 211 U/L — ABNORMAL HIGH (ref 15–41)
Albumin: 3.1 g/dL — ABNORMAL LOW (ref 3.5–5.0)
Alkaline Phosphatase: 50 U/L (ref 38–126)
Anion gap: 9 (ref 5–15)
BUN: 43 mg/dL — ABNORMAL HIGH (ref 8–23)
CO2: 24 mmol/L (ref 22–32)
Calcium: 8.9 mg/dL (ref 8.9–10.3)
Chloride: 102 mmol/L (ref 98–111)
Creatinine, Ser: 1 mg/dL (ref 0.61–1.24)
GFR, Estimated: >60 mL/min (ref >60)
Glucose, Bld: 176 mg/dL — ABNORMAL HIGH (ref 70–99)
Potassium: 5 mmol/L (ref 3.5–5.1)
Sodium: 135 mmol/L (ref 135–145)
Total Bilirubin: 0.5 mg/dL (ref 0.3–1.2)
Total Protein: 6.4 g/dL — ABNORMAL LOW (ref 6.5–8.1)

## 2022-03-20 LAB — SURGICAL PCR SCREEN
MRSA, PCR: NEGATIVE
Staphylococcus aureus: NEGATIVE

## 2022-03-20 LAB — HEPATITIS C ANTIBODY: HCV Ab: NONREACTIVE

## 2022-03-20 LAB — CK: Total CK: 5250 U/L — ABNORMAL HIGH (ref 49–397)

## 2022-03-20 LAB — HEPATITIS B SURFACE ANTIGEN: Hepatitis B Surface Ag: NONREACTIVE

## 2022-03-20 LAB — BRAIN NATRIURETIC PEPTIDE: B Natriuretic Peptide: 121 pg/mL — ABNORMAL HIGH (ref 0.0–100.0)

## 2022-03-20 MED ORDER — CEFAZOLIN SODIUM-DEXTROSE 2-4 GM/100ML-% IV SOLN
2.0000 g | INTRAVENOUS | Status: AC
  Administered 2022-03-23: 2 g via INTRAVENOUS
  Filled 2022-03-20 (×2): qty 100

## 2022-03-20 NOTE — Plan of Care (Signed)

## 2022-03-20 NOTE — Progress Notes (Addendum)
NEUROLOGY CONSULTATION PROGRESS NOTE   Date of service: March 20, 2022 Patient Name: Sean [name] MRN:  [id number] DOB:  [birth year]  Brief HPI  Sean [name] is a 72 y.o. male with PMH significant for  has a past medical history of Alcohol addiction (HCC), Arrhythmia, Coronary artery disease, Depression, ED (erectile dysfunction), HLD (hyperlipidemia), HTN (hypertension), Mental disorder, Peptic ulcer, and RBBB (10/24/2015). who presents with proximal muscle weakness   Interval Hx   No change in exam, patient feels similar to yesterday's assessment.  Vitals   Vitals:   03/19/22 1935 03/20/22 0335 03/20/22 0500 03/20/22 0746  BP: (!) 117/52 129/68  111/72  Pulse: 96 89  80  Resp: 19 17  18   Temp: 97.7 F (36.5 C) 98.5 F (36.9 C)  97.7 F (36.5 C)  TempSrc: Oral Oral  Oral  SpO2: 96% 96%  98%  Weight:   87.6 kg   Height:         Body mass index is 29.37 kg/m.  Physical Exam   General: Laying comfortably in bed; in no acute distress.  HENT: Normal oropharynx and mucosa. Normal external appearance of ears and nose.  Neck: Supple, no pain or tenderness  CV: No JVD. No peripheral edema.  Pulmonary: Symmetric Chest rise. Normal respiratory effort.  Abdomen: Soft to touch, non-tender.  Ext: No cyanosis, edema, or deformity  Skin: No rash. Normal palpation of skin.   Musculoskeletal: Normal digits and nails by inspection. 1+ edema in extremities  Neurologic Examination  Mental status/Cognition: Alert, oriented to self, place, month and year, good attention.  Speech/language: Fluent, comprehension intact, object naming intact, repetition intact.  Cranial nerves:   CN II Pupils equal and reactive to light, no VF deficits    CN III,IV,VI EOM intact, no gaze preference or deviation, no nystagmus    CN V normal sensation in V1, V2, and V3 segments bilaterally    CN VII no asymmetry, no nasolabial fold flattening    CN VIII normal hearing to speech    CN IX & X normal palatal  elevation, no uvular deviation    CN XI 5/5 head turn and 5/5 shoulder shrug bilaterally    CN XII midline tongue protrusion    Motor:  Muscle bulk: normal, tone normal, pronator drift none tremor none Mvmt Root Nerve  Muscle Right Left Comments  SA C5/6 Ax Deltoid 4+ 4+   EF C5/6 Mc Biceps 5 5   EE C6/7/8 Rad Triceps 4+ 4+   WF C6/7 Med FCR 5 5   WE C7/8 PIN ECU 5 5   F Ab C8/T1 U ADM/FDI 5 5   HF L1/2/3 Fem Illopsoas 4 4   KE L2/3/4 Fem Quad 4+ 4+   DF L4/5 D Peron Tib Ant 5 5   PF S1/2 Tibial Grc/Sol 5 5     *Sensory: Intact to light touch, pinprick, temperature vibration throughout. Symmetric. Propioception intact bilat.  No double-simultaneous extinction.  *Coordination:  Finger-to-nose, heel-to-shin, rapid alternating motions were intact. *Reflexes:  2+ and symmetric throughout without clonus; toes down-going bilat *Gait: Unable to stand of seated seated when arms are crossed chest  Labs   Basic Metabolic Panel:  Lab Results  Component Value Date   NA 135 03/20/2022   K 5.0 03/20/2022   CO2 24 03/20/2022   GLUCOSE 176 (H) 03/20/2022   BUN 43 (H) 03/20/2022   CREATININE 1.00 03/20/2022   CALCIUM 8.9 03/20/2022   GFRNONAA >60 03/20/2022   GFRAA >  90 05/04/2011   HbA1c: No results found for: "HGBA1C" LDL:  Lab Results  Component Value Date   LDLCALC 107 (H) 01/27/2022   Urine Drug Screen:     Component Value Date/Time   LABOPIA NONE DETECTED 04/29/2011 0901   COCAINSCRNUR NONE DETECTED 04/29/2011 0901   LABBENZ NONE DETECTED 04/29/2011 0901   AMPHETMU NONE DETECTED 04/29/2011 0901   THCU NONE DETECTED 04/29/2011 0901   LABBARB NONE DETECTED 04/29/2011 0901    Alcohol Level     Component Value Date/Time   ETH 113 (H) 04/29/2011 0856   No results found for: "PHENYTOIN", "ZONISAMIDE", "LAMOTRIGINE", "LEVETIRACETA" No results found for: "PHENYTOIN", "PHENOBARB", "VALPROATE", "CBMZ"  Imaging and Diagnostic studies  Results for orders placed in visit on  01/22/22  ECHOCARDIOGRAM COMPLETE  Narrative ECHOCARDIOGRAM REPORT    Patient Name:   Sean [name]  Date of Exam: 01/22/2022 Medical Rec #:  [id number]     Height:       71.0 in Accession #:    [id number]    Weight:       206.4 lb Date of Birth:  Feb 20, 1950     BSA:          2.137 m Patient Age:    34 years      BP:           126/70 mmHg Patient Gender: M             HR:           111 bpm. Exam Location:  [place]  Procedure: 2D Echo, Cardiac Doppler and Color Doppler  Indications:    R06.09 SOB  History:        Patient has no prior history of Echocardiogram examinations. CAD, Arrythmias:RBBB and PAC; Risk Factors:HLD.  Sonographer:    Marygrace Drought RCS Referring Phys: [place]   1. Left ventricular ejection fraction, by estimation, is 65 to 70%. The left ventricle has normal function. The left ventricle has no regional wall motion abnormalities. Left ventricular diastolic parameters were normal. 2. Right ventricular systolic function is normal. The right ventricular size is normal. Tricuspid regurgitation signal is inadequate for assessing PA pressure. 3. The mitral valve is normal in structure. No evidence of mitral valve regurgitation. 4. The aortic valve is tricuspid. Aortic valve regurgitation is not visualized. Aortic valve sclerosis is present, with no evidence of aortic valve stenosis. 5. The inferior vena cava is normal in size with greater than 50% respiratory variability, suggesting right atrial pressure of 3 mmHg.  FINDINGS Left Ventricle: Left ventricular ejection fraction, by estimation, is 65 to 70%. The left ventricle has normal function. The left ventricle has no regional wall motion abnormalities. The left ventricular internal cavity size was normal in size. There is no left ventricular hypertrophy. Left ventricular diastolic parameters were normal.  Right Ventricle: The right ventricular size is normal. No increase in right  ventricular wall thickness. Right ventricular systolic function is normal. Tricuspid regurgitation signal is inadequate for assessing PA pressure.  Left Atrium: Left atrial size was normal in size.  Right Atrium: Right atrial size was normal in size.  Pericardium: Trivial pericardial effusion is present.  Mitral Valve: The mitral valve is normal in structure. No evidence of mitral valve regurgitation.  Tricuspid Valve: The tricuspid valve is normal in structure. Tricuspid valve regurgitation is trivial.  Aortic Valve: The aortic valve is tricuspid. Aortic valve regurgitation is not visualized. Aortic valve sclerosis is present, with no  evidence of aortic valve stenosis.  Pulmonic Valve: The pulmonic valve was not well visualized. Pulmonic valve regurgitation is not visualized.  Aorta: The aortic root and ascending aorta are structurally normal, with no evidence of dilitation.  Venous: The inferior vena cava is normal in size with greater than 50% respiratory variability, suggesting right atrial pressure of 3 mmHg.  IAS/Shunts: No atrial level shunt detected by color flow Doppler.   LEFT VENTRICLE PLAX 2D LVIDd:         5.20 cm   Diastology LVIDs:         3.20 cm   LV e' medial:    11.20 cm/s LV PW:         0.80 cm   LV E/e' medial:  8.3 LV IVS:        0.60 cm   LV e' lateral:   9.79 cm/s LVOT diam:     2.00 cm   LV E/e' lateral: 9.4 LV SV:         61 LV SV Index:   29 LVOT Area:     3.14 cm   RIGHT VENTRICLE RV Basal diam:  3.70 cm RV S prime:     10.30 cm/s TAPSE (M-mode): 1.4 cm  LEFT ATRIUM             Index        RIGHT ATRIUM           Index LA diam:        2.40 cm 1.12 cm/m   RA Area:     13.80 cm LA Vol (A2C):   33.3 ml 15.58 ml/m  RA Volume:   33.00 ml  15.44 ml/m LA Vol (A4C):   20.0 ml 9.36 ml/m LA Biplane Vol: 27.4 ml 12.82 ml/m AORTIC VALVE LVOT Vmax:   93.30 cm/s LVOT Vmean:  70.100 cm/s LVOT VTI:    0.194 m  AORTA Ao Root diam: 3.30 cm Ao Asc  diam:  3.40 cm  MITRAL VALVE MV Area (PHT):             SHUNTS MV Decel Time:             Systemic VTI:  0.19 m MV E velocity: 92.50 cm/s  Systemic Diam: 2.00 cm MV A velocity: 98.10 cm/s MV E/A ratio:  0.94  Oswaldo Milian MD Electronically signed by Oswaldo Milian MD Signature Date/Time: 01/22/2022/3:15:49 PM    Final   MRI left femur impression 1. Scattered regions of muscular edema in the left thigh, primarily involving the adductor musculature, sartorius, semimembranosus, and long-head biceps femora, with relative sparing of the gracilis, semitendinosus, and short head of the biceps femoris. There is a lesser degree of edema in the adductor magnus muscle and relative sparing of the gracilis, semitendinosus, and short head of the biceps femoris. Similar findings appear to be present in the right thigh region on coronal images. The appearance is compatible with nonspecific myositis and differential diagnostic considerations include polymyositis, dermatomyositis, inclusion body myositis, connective tissue disease associated myositis, autoimmune neuropathy, or rhabdomyolysis. 2. Degenerative arthropathy of the left hip joint, although assessment is limited due to boundary artifact.  Impression   Sean [name] is a 72 y.o. male with PMH significant for Alcohol addiction ([place]), Arrhythmia, Coronary artery disease, Depression, ED (erectile dysfunction), HLD (hyperlipidemia), HTN (hypertension), Mental disorder, Peptic ulcer, and RBBB (10/24/2015) . His neurologic examination is notable for proximal muscle weakness greater than distal b/l symmetrical weakness with lower extremities worse than  uppers and myalgias. Presentation consistent with myositis. Plan to do muscle biopsy today, started solumedrol yesterday.  Recommendations  -Trend CK levels-improving - Myomarker 3 plus panel was ordered which evaluates for myositis specific antibodies, myositis associated  antibodies along with HMGCoA reductase Ab and Anti-cN-1A Ab.  - Continue solumedrol 1000mg  IV daily x 5 doses (first dose 2/1). Will need prolonged PO taper after IV steroids are completed. PPI while on steroids. - EMG/NCS outpatient by Dr. Narda Amber with [facility] neurology.  ______________________________________________________________________   Thank you for the opportunity to take part in the care of this patient. If you have any further questions, please contact the neurology consultation attending.  Signed,  Coralyn Pear, MD PGY-1 Psychiatry

## 2022-03-20 NOTE — Progress Notes (Signed)
* Surgery Date in Future *  Subjective: Patient with no new complaints.  Upset about not being able to have his procedure today, appropriately so  Objective: Vital signs in last 24 hours: Temp:  [97.7 F (36.5 C)-98.5 F (36.9 C)] 97.7 F (36.5 C) (02/02 0746) Pulse Rate:  [80-96] 80 (02/02 0746) Resp:  [17-19] 18 (02/02 0746) BP: (111-129)/(52-72) 111/72 (02/02 0746) SpO2:  [96 %-100 %] 98 % (02/02 0746) Weight:  [87.6 kg] 87.6 kg (02/02 0500) Last BM Date : 03/19/22  Intake/Output from previous day: 02/01 0701 - 02/02 0700 In: 480 [P.O.:480] Out: -  Intake/Output this shift: No intake/output data recorded.  PE: Gen: NAD Lungs: respiratory effort nonlabored Psych: A&Ox3  Lab Results:  Recent Labs    03/19/22 0406 03/20/22 0609  WBC 5.9 6.0  HGB 13.6 14.8  HCT 41.3 47.1  PLT 277 337   BMET Recent Labs    03/19/22 0406 03/20/22 0609  NA 138 135  K 3.4* 5.0  CL 102 102  CO2 27 24  GLUCOSE 97 176*  BUN 41* 43*  CREATININE 1.01 1.00  CALCIUM 8.5* 8.9   PT/INR No results for input(s): "LABPROT", "INR" in the last 72 hours. CMP     Component Value Date/Time   NA 135 03/20/2022 0609   NA 139 12/23/2021 0840   K 5.0 03/20/2022 0609   CL 102 03/20/2022 0609   CO2 24 03/20/2022 0609   GLUCOSE 176 (H) 03/20/2022 0609   BUN 43 (H) 03/20/2022 0609   BUN 28 (H) 12/23/2021 0840   CREATININE 1.00 03/20/2022 0609   CALCIUM 8.9 03/20/2022 0609   PROT 6.4 (L) 03/20/2022 0609   PROT 6.3 01/27/2022 1005   ALBUMIN 3.1 (L) 03/20/2022 0609   ALBUMIN 3.7 (L) 01/27/2022 1005   AST 211 (H) 03/20/2022 0609   ALT 207 (H) 03/20/2022 0609   ALKPHOS 50 03/20/2022 0609   BILITOT 0.5 03/20/2022 0609   BILITOT 0.3 01/27/2022 1005   GFRNONAA >60 03/20/2022 0609   GFRAA >90 05/04/2011 1312   Lipase  No results found for: "LIPASE"     Studies/Results: MR FEMUR LEFT W WO CONTRAST  Result Date: 03/19/2022 CLINICAL DATA:  Weakness in the lower extremities with  bile Oralia Rud, query myositis EXAM: MR OF THE LEFT LOWER EXTREMITY WITHOUT AND WITH CONTRAST TECHNIQUE: Multiplanar, multisequence MR imaging of the left femur/thigh was performed both before and after administration of intravenous contrast. CONTRAST:  31m GADAVIST GADOBUTROL 1 MMOL/ML IV SOLN COMPARISON:  None Available. FINDINGS: Bones/Joint/Cartilage Degenerative arthropathy of the left hip joint, although assessment is limited due to boundary artifact. Similarly the knee is obscured by boundary artifact. No significant abnormal femoral intraosseous signal is observed. Ligaments N/A Muscles and Tendons Scattered regions of muscular edema in the thigh including the hip adductor musculature, sartorius, semimembranosus, and long-head biceps femora S muscles. There is a lesser degree edema in the adductor magnus muscle and relative sparing of the gracilis, semitendinosus, and short head of the biceps femoris. On coronal images the contralateral lower extremity seems to have a relatively similar distribution of patchy edema on STIR images within the musculature, although with a greater degree of sparing of the adductor magnus. On post-contrast images the same regions of edema demonstrate subtle accentuated enhancement. No fatty infiltration of the muscles. No findings of abscess. Soft tissues Unremarkable IMPRESSION: 1. Scattered regions of muscular edema in the left thigh, primarily involving the adductor musculature, sartorius, semimembranosus, and long-head biceps femora, with  relative sparing of the gracilis, semitendinosus, and short head of the biceps femoris. There is a lesser degree of edema in the adductor magnus muscle and relative sparing of the gracilis, semitendinosus, and short head of the biceps femoris. Similar findings appear to be present in the right thigh region on coronal images. The appearance is compatible with nonspecific myositis and differential diagnostic considerations include polymyositis,  dermatomyositis, inclusion body myositis, connective tissue disease associated myositis, autoimmune neuropathy, or rhabdomyolysis. 2. Degenerative arthropathy of the left hip joint, although assessment is limited due to boundary artifact. Electronically Signed   By: Van Clines M.D.   On: 03/19/2022 18:27    Anti-infectives: Anti-infectives (From admission, onward)    Start     Dose/Rate Route Frequency Ordered Stop   03/23/22 0600  ceFAZolin (ANCEF) IVPB 2g/100 mL premix        2 g 200 mL/hr over 30 Minutes Intravenous On call to O.R. 03/20/22 0931 03/24/22 0559   03/20/22 0600  ceFAZolin (ANCEF) IVPB 2g/100 mL premix  Status:  Discontinued        2 g 200 mL/hr over 30 Minutes Intravenous On call to O.R. 03/19/22 1129 03/20/22 0931        Assessment/Plan Myositis  - plan for muscle bx likely on Monday as scheduling does not allow for procedure today -may eat today and NPO p MN on Sunday night   FEN - regular diet, NPO p MN on Sunday night VTE - Lovenox ID - Ancef on call to OR  I reviewed Consultant neurology notes, hospitalist notes, last 24 h vitals and pain scores, last 48 h intake and output, last 24 h labs and trends, and last 24 h imaging results.   LOS: 2 days    Henreitta Cea , [facility] 03/20/2022, 9:40 AM Please see Amion for pager number during day hours 7:00am-4:30pm or 7:00am -11:30am on weekends

## 2022-03-20 NOTE — Progress Notes (Signed)
Brief Nutrition Note  New MD consult received for assessment of nutrition needs. Pt was evaluated by RD 2/1 and recommendations entered. See nutrition note for complete assessment. Pt currently NPO for procedure. After pt returns to his room, recommend resumption of regular diet. Supplements are in place.  Will follow-up as planned  Ranell Patrick, RD, LDN Clinical Dietitian RD pager # available in [place]  After hours/weekend pager # available in [facility]

## 2022-03-20 NOTE — Progress Notes (Signed)
Mobility Specialist Progress Note:   03/20/22 1210  Mobility  Activity Ambulated independently in hallway  Level of Assistance Independent  Assistive Device None  Distance Ambulated (ft) 150 ft  Activity Response Tolerated well  Mobility Referral Yes  $Mobility charge 1 Mobility   Pt received in chair and agreeable. C/o fatigue at end of session. Pt returned to chair with all needs met and call bell in reach.   Andrey Campanile Mobility Specialist Please contact via SecureChat or  Rehab office at [phone]

## 2022-03-20 NOTE — Progress Notes (Signed)
PROGRESS NOTE Sean [name]  JEH:[id number] DOB: Apr 24, 1950 DOA: 03/18/2022 PCP: [name], MD   Brief Narrative/Hospital Course: 72 y.o.m w/ HTN HLD, and CAD with remote PCI who presents to the hospital as direct admission for expedited muscle biopsy and initiation of myositis treatment. 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  He has extremity swelling associated with this but no fevers or rash.  He was evaluated by cardiology for his activity intolerance and had echocardiogram with normal EF and no diastolic dysfunction.He was started on Lasix for his swelling, but has not improved with this. He had elevated transaminases that were noted to be increasing, saw GI for this, and had CT of the abdomen and pelvis performed that was negative for any acute hepatobiliary finding.He has a history of alcoholism but reports just 0-1 drinks per day for the past several months.   seen at pulmonology and had workup notable for serum CK of 6777, aldolase of 83, and positive ANA with concern for acute myositis, direct admission to the hospital was arranged in order to expedite muscle biopsy and initiation of treatment.   Subjective:  Seen and examined this morning Resting comfortably. his biopsy is more for Monday CK is downtrending  Assessment and Plan: Principal Problem:   Myositis Active Problems:   Abnormal liver enzymes   HTN (hypertension)   Coronary artery disease   Hypothyroidism   Elevated transaminase level    Myositis Months of progressive weakness and myalgias: He endorses weakness in proximal muscle onset in October, felt like chf- so saw cardioogy- had no chf- progressive weakness  worsening worse on incline, on flat  surface has to stop after 5o steps.  Denies respiratory symptoms or dysphagia speech issues.has had Rauynad's for many years, but recently gotten worse.Has had extensive workup as outpatient notable for aldolase positive CK elevated ANA positive Myomarker 3+ profile/rheumatoid factor/CCP antibody/RNP antibody collected 1/31.  Esr, crp normal, Antiscleroderma antibody negative,hypersensitivity pneumonitis negative CRP less than 1.  Appreciate neurology input started on high-dose IV steroid.  Monitor CK for treatment response and downtrending.  I discussed with Dr. Benjamine Mola from outpatient rheumatology clinic 2/1 reviewed his laboratory workup,.  Made ambulatory referral and resources will follow-up soon, neuro wants to continue high-dose steroids for now and on discharge switch to 1 mg/kg or 40 mg twice daily prednisone with PPI.  Awaiting on biopsy which will be done Monday on Monday.  He will need to follow-up with Dr. Narda Amber from neurology for EMG and NCS.  Weight loss:Patient endorses about 20 pound weight loss since October will consult dietitian.He had CT abdomen pelvis 12/27-no acute finding-no evidence of malignancy.  He will need continued outpatient follow-up to evaluate further etiology if weight does not improve with myositis treatment  Elevated transaminases: He saw GI for this and had CT with no acute hepatobiliary findings as outpatient  Likely secondary to rhabdomyolysis/myositis +/- alcohol  Hypokalemia resolved   CAD hx w/  remote BMS to LAD Edema of lower extremities and upper extremities: No chest pain. On Plavix and beta-blocker. At home on on Lasix PRN, holding.  Frequent PACs: cont metoprolol.   Hypothyroidism continue Synthroid  Class I Obesity:Patient's Body mass index is 29.37 kg/m. : Will benefit with PCP follow-up, weight loss  healthy lifestyle and outpatient sleep evaluation.  DVT prophylaxis: enoxaparin (LOVENOX) injection 40 mg Start: 03/18/22 2100 Code Status:    Code Status: Full Code Family Communication: plan of care discussed with patient at bedside. Patient status is: inpatient  because of progressive weakness/myositis Level of care: Med-Surg   Dispo: The patient is from: home            Anticipated disposition: Home after biopsy  Objective: Vitals last 24 hrs: Vitals:   03/19/22 1935 03/20/22 0335 03/20/22 0500 03/20/22 0746  BP: (!) 117/52 129/68  111/72  Pulse: 96 89  80  Resp: 19 17  18   Temp: 97.7 F (36.5 C) 98.5 F (36.9 C)  97.7 F (36.5 C)  TempSrc: Oral Oral  Oral  SpO2: 96% 96%  98%  Weight:   87.6 kg   Height:       Weight change: -2.638 kg  Physical Examination: General exam: AAOX3, weak,older appearing HEENT:Oral mucosa moist, Ear/Nose WNL grossly, dentition normal. Respiratory system: bilaterally CLEAR BS,no use of accessory muscle Cardiovascular system: S1 & S2 +, regular rate, JVD neg. Gastrointestinal system: Abdomen soft, NT,ND,BS+ Nervous System:Alert, awake, moving extremities and grossly nonfocal Extremities: LE ankle edema neg, lower extremities weak-proximal muscle weakness present Skin: No rashes,no icterus. MSK: Normal muscle bulk,tone, power   Medications reviewed:  Scheduled Meds:  enoxaparin (LOVENOX) injection  40 mg Subcutaneous Q24H   feeding supplement  237 mL Oral Q24H   levothyroxine  25 mcg Oral Q0600   lisinopril  20 mg Oral Daily   metoprolol succinate  50 mg Oral QPM   mupirocin ointment  1 Application Nasal BID   pantoprazole (PROTONIX) IV  40 mg Intravenous Q24H   Continuous Infusions:  [START ON 03/23/2022]  ceFAZolin (ANCEF) IV     methylPREDNISolone (SOLU-MEDROL) injection 1,000 mg (03/19/22 1913)    Diet Order             Diet NPO time specified Except for: Sips with Meds  Diet effective midnight           Diet regular Room service appropriate? Yes; Fluid consistency: Thin  Diet effective now                   Intake/Output Summary (Last 24 hours) at 03/20/2022  1053 Last data filed at 03/19/2022 1930 Gross per 24 hour  Intake 360 ml  Output --  Net 360 ml    Net IO Since Admission: 720 mL [03/20/22 1053]  Wt Readings from Last 3 Encounters:  03/20/22 87.6 kg  03/18/22 90.3 kg  03/11/22 91.4 kg     Unresulted Labs (From admission, onward)     Start     Ordered   03/25/22 0500  Creatinine, serum  (enoxaparin (LOVENOX)    CrCl >/= 30 ml/min)  Weekly,   R     Comments: while on enoxaparin therapy    03/18/22 1950   03/20/22 0500  CK  Daily,   R      03/19/22 1128          Data Reviewed: I have personally reviewed following labs and imaging studies CBC: Recent Labs  Lab 03/18/22 2002 03/19/22 0406 03/20/22 0609  WBC 7.3 5.9 6.0  HGB 14.1 13.6 14.8  HCT 43.4 41.3 47.1  MCV 93.9 93.4 94.0  PLT 320 277 269    Basic Metabolic Panel: Recent Labs  Lab 03/18/22 2002 03/19/22 0406 03/20/22 0609  NA 139 138 135  K 3.8 3.4*  5.0  CL 102 102 102  CO2 28 27 24   GLUCOSE 99 97 176*  BUN 37* 41* 43*  CREATININE 1.07 1.01 1.00  CALCIUM 9.0 8.5* 8.9  MG 2.0  --   --     GFR: Estimated Creatinine Clearance: 72.9 mL/min (by C-G formula based on SCr of 1 mg/dL). Liver Function Tests: Recent Labs  Lab 03/18/22 2002 03/19/22 0406 03/20/22 0609  AST 255* 212* 211*  ALT 215* 175* 207*  ALKPHOS 59 47 50  BILITOT 0.5 0.7 0.5  PROT 6.2* 5.6* 6.4*  ALBUMIN 3.3* 2.8* 3.1*    Recent Labs  Lab 03/18/22 1757  GLUCAP 98    Antimicrobials: Anti-infectives (From admission, onward)    Start     Dose/Rate Route Frequency Ordered Stop   03/23/22 0600  ceFAZolin (ANCEF) IVPB 2g/100 mL premix        2 g 200 mL/hr over 30 Minutes Intravenous On call to O.R. 03/20/22 0931 03/24/22 0559   03/20/22 0600  ceFAZolin (ANCEF) IVPB 2g/100 mL premix  Status:  Discontinued        2 g 200 mL/hr over 30 Minutes Intravenous On call to O.R. 03/19/22 1129 03/20/22 0931      Culture/Microbiology No results found for: "SDES", "SPECREQUEST",  "CULT", "REPTSTATUS"  Other culture-see note  Radiology Studies: MR FEMUR LEFT W WO CONTRAST  Result Date: 03/19/2022 CLINICAL DATA:  Weakness in the lower extremities with bile Oralia Rud, query myositis EXAM: MR OF THE LEFT LOWER EXTREMITY WITHOUT AND WITH CONTRAST TECHNIQUE: Multiplanar, multisequence MR imaging of the left femur/thigh was performed both before and after administration of intravenous contrast. CONTRAST:  42mL GADAVIST GADOBUTROL 1 MMOL/ML IV SOLN COMPARISON:  None Available. FINDINGS: Bones/Joint/Cartilage Degenerative arthropathy of the left hip joint, although assessment is limited due to boundary artifact. Similarly the knee is obscured by boundary artifact. No significant abnormal femoral intraosseous signal is observed. Ligaments N/A Muscles and Tendons Scattered regions of muscular edema in the thigh including the hip adductor musculature, sartorius, semimembranosus, and long-head biceps femora S muscles. There is a lesser degree edema in the adductor magnus muscle and relative sparing of the gracilis, semitendinosus, and short head of the biceps femoris. On coronal images the contralateral lower extremity seems to have a relatively similar distribution of patchy edema on STIR images within the musculature, although with a greater degree of sparing of the adductor magnus. On post-contrast images the same regions of edema demonstrate subtle accentuated enhancement. No fatty infiltration of the muscles. No findings of abscess. Soft tissues Unremarkable IMPRESSION: 1. Scattered regions of muscular edema in the left thigh, primarily involving the adductor musculature, sartorius, semimembranosus, and long-head biceps femora, with relative sparing of the gracilis, semitendinosus, and short head of the biceps femoris. There is a lesser degree of edema in the adductor magnus muscle and relative sparing of the gracilis, semitendinosus, and short head of the biceps femoris. Similar findings appear to be  present in the right thigh region on coronal images. The appearance is compatible with nonspecific myositis and differential diagnostic considerations include polymyositis, dermatomyositis, inclusion body myositis, connective tissue disease associated myositis, autoimmune neuropathy, or rhabdomyolysis. 2. Degenerative arthropathy of the left hip joint, although assessment is limited due to boundary artifact. Electronically Signed   By: Van Clines M.D.   On: 03/19/2022 18:27     LOS: 2 days   Antonieta Pert, MD [facility]  03/20/2022, 10:53 AM

## 2022-03-21 ENCOUNTER — Encounter (HOSPITAL_COMMUNITY)
Admission: AD | Disposition: A | Discharge status: Home / Self Care | Payer: Self-pay | Source: Home / Self Care | Attending: Internal Medicine

## 2022-03-21 DIAGNOSIS — I10 Essential (primary) hypertension: Secondary | ICD-10-CM

## 2022-03-21 DIAGNOSIS — M6089 Other myositis, multiple sites: Secondary | ICD-10-CM | POA: Diagnosis not present

## 2022-03-21 DIAGNOSIS — E039 Hypothyroidism, unspecified: Secondary | ICD-10-CM | POA: Diagnosis not present

## 2022-03-21 LAB — CK: Total CK: 2592 U/L — ABNORMAL HIGH (ref 49–397)

## 2022-03-21 SURGERY — MUSCLE BIOPSY
Anesthesia: Choice

## 2022-03-21 NOTE — Plan of Care (Signed)

## 2022-03-21 NOTE — Progress Notes (Signed)
PROGRESS NOTE Sean [name]  DTO:[id number] DOB: 1951-01-29 DOA: 03/18/2022 PCP: [name], MD   Brief Narrative/Hospital Course: 72 y.o.m w/ HTN HLD, and CAD with remote PCI who presents to the hospital as direct admission for expedited muscle biopsy and initiation of myositis treatment. Patient reports months of progressive generalized weakness with mild muscle aches, primarily involving the shoulders,thighs,and trunk. 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  He has extremity swelling associated with this but no fevers or rash.  He was evaluated by cardiology for his activity intolerance and had echocardiogram with normal EF and no diastolic dysfunction. He was started on Lasix for his swelling, but has not improved with this. He had elevated transaminases that were noted to be increasing, saw GI for this, and had CT of the abdomen and pelvis performed that was negative for any acute hepatobiliary finding. He has a history of alcoholism but reports just 0-1 drinks per day for the past several months. Seen at pulmonology and had workup notable for serum CK of 6777, aldolase of 83, and positive ANA with concern for acute myositis, direct admission to the hospital was arranged in order to expedite muscle biopsy and initiation of treatment.    Subjective: Patient seen and examined today, saw patient right after he had ambulated with the mobility specialist, noted to still have some generalized fatigue upon exertion.  Wife at bedside.   Assessment and Plan: Principal Problem:   Myositis Active Problems:   Abnormal liver enzymes   HTN (hypertension)   Coronary artery disease   Hypothyroidism   Elevated transaminase level    Myositis Months of progressive proximal weakness and myalgias: Denies respiratory symptoms or dysphagia/speech issues Has a Hx of  Rauynad's for many years, but recently gotten worse Extensive workup as outpatient notable for aldolase positive, CK elevated, ANA positive Myomarker 3+ profile/rheumatoid factor/CCP antibody/RNP antibody collected 1/31 Esr, crp normal, Antiscleroderma antibody negative, hypersensitivity pneumonitis negative Appreciate neurology input, started on high-dose IV steroid, monitor CK for treatment response and downtrending Previous attending discussed with Dr. Benjamine Mola from outpatient rheumatology clinic on 2/1 reviewed his chart, recommended ambulatory referral for close follow-up  Neurology recommends high-dose steroids for now and on discharge switch to 1 mg/kg or 40 mg twice daily prednisone with PPI Awaiting biopsy which will be done on 03/23/22 by general surgery Outpt follow up with Dr. Narda Amber from neurology for EMG and NCS  Weight loss Patient endorses about 20 pound weight loss since October will consult dietitian CT abdomen pelvis 12/27-no acute finding-no evidence of malignancy Outpatient follow-up to evaluate further etiology if weight does not improve  Elevated transaminases Saw GI as an outpatient, CT with no acute hepatobiliary findings as outpatient  Likely secondary to rhabdomyolysis/myositis +/- alcohol    CAD hx w/  remote BMS to LAD Edema of lower extremities and upper extremities: No chest pain. On Plavix and beta-blocker. At home on on Lasix PRN, holding Elevate BLE  Frequent PACs Cont metoprolol   Hypothyroidism Continue Synthroid   Class I Obesity Patient's Body mass index is 31.51 kg/m.     DVT prophylaxis: enoxaparin (LOVENOX) injection 40 mg Start: 03/18/22 2100 Code Status:   Code Status: Full Code Family Communication: Discussed with wife at bedside Patient status is: inpatient  because of progressive weakness/myositis Level of care: Med-Surg   Dispo: The  patient is from: home            Anticipated disposition: Home after biopsy      Objective: Vitals last 24 hrs: Vitals:   03/20/22 2033 03/21/22 0426 03/21/22 0751 03/21/22 1540  BP: 132/80 (!) 120/56 125/72 138/74  Pulse: 77 82 79 74  Resp: 15 17 18 18   Temp: 97.8 F (36.6 C) 97.9 F (36.6 C) 97.6 F (36.4 C) (!) 97.5 F (36.4 C)  TempSrc: Oral Oral Oral Oral  SpO2: 97% 98% 100% 100%  Weight:  94 kg    Height:       Weight change: 6.372 kg  Physical Examination: General: NAD  Cardiovascular: S1, S2 present Respiratory: CTAB Abdomen: Soft, nontender, nondistended, bowel sounds present Musculoskeletal: bilateral pedal edema noted, noted proximal muscle weakness Skin: Normal Psychiatry: Normal mood    Medications reviewed:  Scheduled Meds:  enoxaparin (LOVENOX) injection  40 mg Subcutaneous Q24H   feeding supplement  237 mL Oral Q24H   levothyroxine  25 mcg Oral Q0600   lisinopril  20 mg Oral Daily   metoprolol succinate  50 mg Oral QPM   mupirocin ointment  1 Application Nasal BID   pantoprazole (PROTONIX) IV  40 mg Intravenous Q24H   Continuous Infusions:  [START ON 03/23/2022]  ceFAZolin (ANCEF) IV     methylPREDNISolone (SOLU-MEDROL) injection 1,000 mg (03/20/22 1814)    Diet Order             Diet NPO time specified Except for: Sips with Meds  Diet effective midnight           Diet regular Room service appropriate? Yes; Fluid consistency: Thin  Diet effective now                   Intake/Output Summary (Last 24 hours) at 03/21/2022 1643 Last data filed at 03/21/2022 0300 Gross per 24 hour  Intake 240 ml  Output --  Net 240 ml   Net IO Since Admission: 1,010.02 mL [03/21/22 1643]  Wt Readings from Last 3 Encounters:  03/21/22 94 kg  03/18/22 90.3 kg  03/11/22 91.4 kg     Unresulted Labs (From admission, onward)     Start     Ordered   03/25/22 0500  Creatinine, serum  (enoxaparin (LOVENOX)    CrCl >/= 30 ml/min)  Weekly,   R     Comments: while on enoxaparin therapy    03/18/22 1950   03/20/22 0500  CK  Daily,   R       03/19/22 1128          Data Reviewed: I have personally reviewed following labs and imaging studies CBC: Recent Labs  Lab 03/18/22 2002 03/19/22 0406 03/20/22 0609  WBC 7.3 5.9 6.0  HGB 14.1 13.6 14.8  HCT 43.4 41.3 47.1  MCV 93.9 93.4 94.0  PLT 320 277 161   Basic Metabolic Panel: Recent Labs  Lab 03/18/22 2002 03/19/22 0406 03/20/22 0609  NA 139 138 135  K 3.8 3.4* 5.0  CL 102 102 102  CO2 28 27 24   GLUCOSE 99 97 176*  BUN 37* 41* 43*  CREATININE 1.07 1.01 1.00  CALCIUM 9.0 8.5* 8.9  MG 2.0  --   --    GFR: Estimated Creatinine Clearance: 75.3 mL/min (by C-G formula based on SCr of 1 mg/dL). Liver Function Tests: Recent Labs  Lab 03/18/22 2002 03/19/22 0406 03/20/22 0609  AST 255* 212* 211*  ALT 215* 175* 207*  ALKPHOS 59 47 50  BILITOT 0.5 0.7 0.5  PROT 6.2* 5.6* 6.4*  ALBUMIN 3.3* 2.8* 3.1*   Recent Labs  Lab 03/18/22 1757  GLUCAP 98   Antimicrobials: Anti-infectives (From admission, onward)    Start     Dose/Rate Route Frequency Ordered Stop   03/23/22 0600  ceFAZolin (ANCEF) IVPB 2g/100 mL premix        2 g 200 mL/hr over 30 Minutes Intravenous On call to O.R. 03/20/22 0931 03/24/22 0559   03/20/22 0600  ceFAZolin (ANCEF) IVPB 2g/100 mL premix  Status:  Discontinued        2 g 200 mL/hr over 30 Minutes Intravenous On call to O.R. 03/19/22 1129 03/20/22 0931      Culture/Microbiology No results found for: "SDES", "SPECREQUEST", "CULT", "REPTSTATUS"  Other culture-see note  Radiology Studies: MR FEMUR LEFT W WO CONTRAST  Result Date: 03/19/2022 CLINICAL DATA:  Weakness in the lower extremities with bile Oralia Rud, query myositis EXAM: MR OF THE LEFT LOWER EXTREMITY WITHOUT AND WITH CONTRAST TECHNIQUE: Multiplanar, multisequence MR imaging of the left femur/thigh was performed both before and after administration of intravenous contrast. CONTRAST:  32mL GADAVIST GADOBUTROL 1 MMOL/ML IV SOLN COMPARISON:  None Available. FINDINGS:  [name]/Joint/Cartilage Degenerative arthropathy of the left hip joint, although assessment is limited due to boundary artifact. Similarly the knee is obscured by boundary artifact. No significant abnormal femoral intraosseous signal is observed. Ligaments N/A Muscles and Tendons Scattered regions of muscular edema in the thigh including the hip adductor musculature, sartorius, semimembranosus, and long-head biceps femora S muscles. There is a lesser degree edema in the adductor magnus muscle and relative sparing of the gracilis, semitendinosus, and short head of the biceps femoris. On coronal images the contralateral lower extremity seems to have a relatively similar distribution of patchy edema on STIR images within the musculature, although with a greater degree of sparing of the adductor magnus. On post-contrast images the same regions of edema demonstrate subtle accentuated enhancement. No fatty infiltration of the muscles. No findings of abscess. Soft tissues Unremarkable IMPRESSION: 1. Scattered regions of muscular edema in the left thigh, primarily involving the adductor musculature, sartorius, semimembranosus, and long-head biceps femora, with relative sparing of the gracilis, semitendinosus, and short head of the biceps femoris. There is a lesser degree of edema in the adductor magnus muscle and relative sparing of the gracilis, semitendinosus, and short head of the biceps femoris. Similar findings appear to be present in the right thigh region on coronal images. The appearance is compatible with nonspecific myositis and differential diagnostic considerations include polymyositis, dermatomyositis, inclusion body myositis, connective tissue disease associated myositis, autoimmune neuropathy, or rhabdomyolysis. 2. Degenerative arthropathy of the left hip joint, although assessment is limited due to boundary artifact. Electronically Signed   By: Van Clines M.D.   On: 03/19/2022 18:27     LOS: 3 days    Alma Friendly, MD [facility]  03/21/2022, 4:43 PM

## 2022-03-21 NOTE — Progress Notes (Signed)
Mobility Specialist Progress Note:   03/21/22 1510  Mobility  Activity Ambulated independently in hallway  Level of Assistance Modified independent, requires aide device or extra time  Assistive Device None  Distance Ambulated (ft) 400 ft  Activity Response Tolerated well  Mobility Referral Yes  $Mobility charge 1 Mobility   Pt agreeable to mobility session. Required no physical assistance throughout, however x1 seated rest break taken d/t fatigue. SpO2 99% on RA, pt c/o abdominal tightness upon exertion. Back in chair with all needs met.   Nelta Numbers Mobility Specialist Please contact via SecureChat or  Rehab office at [phone]

## 2022-03-22 ENCOUNTER — Inpatient Hospital Stay (HOSPITAL_COMMUNITY): Payer: PPO

## 2022-03-22 DIAGNOSIS — M7989 Other specified soft tissue disorders: Secondary | ICD-10-CM

## 2022-03-22 DIAGNOSIS — I10 Essential (primary) hypertension: Secondary | ICD-10-CM | POA: Diagnosis not present

## 2022-03-22 DIAGNOSIS — M609 Myositis, unspecified: Secondary | ICD-10-CM

## 2022-03-22 DIAGNOSIS — E039 Hypothyroidism, unspecified: Secondary | ICD-10-CM | POA: Diagnosis not present

## 2022-03-22 DIAGNOSIS — M6089 Other myositis, multiple sites: Secondary | ICD-10-CM | POA: Diagnosis not present

## 2022-03-22 LAB — CK: Total CK: 1605 U/L — ABNORMAL HIGH (ref 49–397)

## 2022-03-22 MED ORDER — PANTOPRAZOLE SODIUM 40 MG IV SOLR
40.0000 mg | INTRAVENOUS | Status: DC
  Administered 2022-03-22: 40 mg via INTRAVENOUS
  Filled 2022-03-22 (×2): qty 10

## 2022-03-22 MED ORDER — PANTOPRAZOLE SODIUM 40 MG IV SOLR: 40.0000 mg | INTRAVENOUS | Status: DC

## 2022-03-22 MED ORDER — SODIUM CHLORIDE 0.9 % IV SOLN
1000.0000 mg | INTRAVENOUS | Status: DC
  Administered 2022-03-22: 1000 mg via INTRAVENOUS
  Filled 2022-03-22: qty 16

## 2022-03-22 MED ORDER — SODIUM CHLORIDE 0.9 % IV SOLN
1000.0000 mg | INTRAVENOUS | Status: AC
  Administered 2022-03-23: 1000 mg via INTRAVENOUS
  Filled 2022-03-22: qty 16

## 2022-03-22 NOTE — Progress Notes (Signed)
PROGRESS NOTE Sean [name]  XBM:[id number] DOB: 12-31-1950 DOA: 03/18/2022 PCP: [name], MD   Brief Narrative/Hospital Course: 72 y.o.m w/ HTN HLD, and CAD with remote PCI who presents to the hospital as direct admission for expedited muscle biopsy and initiation of myositis treatment. Patient reports months of progressive generalized weakness with mild muscle aches, primarily involving the shoulders,thighs,and trunk. 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  He has extremity swelling associated with this but no fevers or rash.  He was evaluated by cardiology for his activity intolerance and had echocardiogram with normal EF and no diastolic dysfunction. He was started on Lasix for his swelling, but has not improved with this. He had elevated transaminases that were noted to be increasing, saw GI for this, and had CT of the abdomen and pelvis performed that was negative for any acute hepatobiliary finding. He has a history of alcoholism but reports just 0-1 drinks per day for the past several months. Seen at pulmonology and had workup notable for serum CK of 6777, aldolase of 83, and positive ANA with concern for acute myositis, direct admission to the hospital was arranged in order to expedite muscle biopsy and initiation of treatment.    Subjective: Patient denies any new complaints. Met patient eating lunch. Has been refusing his lovenox    Assessment and Plan: Principal Problem:   Myositis Active Problems:   Abnormal liver enzymes   HTN (hypertension)   Coronary artery disease   Hypothyroidism   Elevated transaminase level    Myositis Months of progressive proximal weakness and myalgias: Denies respiratory symptoms or dysphagia/speech issues Has a Hx of Rauynad's for many years, but recently gotten worse Extensive workup as outpatient notable for  aldolase positive, CK elevated, ANA positive Myomarker 3+ profile/rheumatoid factor/CCP antibody/RNP antibody collected 1/31 Esr, crp normal, Antiscleroderma antibody negative, hypersensitivity pneumonitis negative Appreciate neurology input, started on high-dose IV steroid Monitor CK for treatment response, currently downtrending Previous attending discussed with Dr. Benjamine Mola from outpatient rheumatology clinic on 2/1 reviewed his chart, recommended ambulatory referral for close follow-up  Neurology recommends high-dose steroids while inpatient, and discharge on 60 mg PO prednisone daily for 30 days, then decrease to 40 mg daily for another 30 days and then 20 mg thereafter Continue PPI upon d/c Awaiting biopsy which will be done on 03/23/22 by general surgery Outpt follow up with Dr. Narda Amber from neurology for EMG and NCS  Weight loss Patient endorses about 20 pound weight loss since October will consult dietitian CT abdomen pelvis 12/27-no acute finding-no evidence of malignancy Outpatient follow-up to evaluate further etiology if weight does not improve  Elevated transaminases Saw GI as an outpatient, CT with no acute hepatobiliary findings as outpatient  Likely secondary to rhabdomyolysis/myositis +/- alcohol    CAD hx w/  remote BMS to LAD Edema of lower extremities and upper extremities: No chest pain. On Plavix and beta-blocker. At home on Lasix PRN, holding Elevate BLE Bilateral doppler negative DVT  Frequent PACs Cont metoprolol   Hypothyroidism Continue Synthroid   Class I Obesity Patient's Body mass index is 31.51 kg/m.     DVT prophylaxis: enoxaparin (LOVENOX) injection 40 mg Start: 03/18/22 2100 Code Status:   Code Status: Full Code Family Communication: None at bedside Patient status is: inpatient  because of progressive weakness/myositis Level of care: Med-Surg  Dispo: The patient is from: home            Anticipated disposition: Home after biopsy      Objective: Vitals last 24 hrs: Vitals:   03/21/22 1540 03/21/22 1914 03/22/22 0418 03/22/22 0724  BP: 138/74 129/70 129/65 130/79  Pulse: 74 75 81 72  Resp: 18 18 16 17   Temp: (!) 97.5 F (36.4 C) 98 F (36.7 C) 97.9 F (36.6 C) 98.8 F (37.1 C)  TempSrc: Oral Oral Oral Oral  SpO2: 100% 99% 99% 97%  Weight:      Height:       Weight change:   Physical Examination: General: NAD  Cardiovascular: S1, S2 present Respiratory: CTAB Abdomen: Soft, nontender, nondistended, bowel sounds present Musculoskeletal: bilateral pedal edema noted, noted proximal muscle weakness Skin: Normal Psychiatry: Normal mood    Medications reviewed:  Scheduled Meds:  enoxaparin (LOVENOX) injection  40 mg Subcutaneous Q24H   feeding supplement  237 mL Oral Q24H   levothyroxine  25 mcg Oral Q0600   lisinopril  20 mg Oral Daily   metoprolol succinate  50 mg Oral QPM   mupirocin ointment  1 Application Nasal BID   pantoprazole (PROTONIX) IV  40 mg Intravenous Q24H   Continuous Infusions:  [START ON 03/23/2022]  ceFAZolin (ANCEF) IV     methylPREDNISolone (SOLU-MEDROL) injection 1,000 mg (03/21/22 1750)    Diet Order             Diet NPO time specified Except for: Sips with Meds  Diet effective midnight           Diet regular Room service appropriate? Yes; Fluid consistency: Thin  Diet effective now                  No intake or output data in the 24 hours ending 03/22/22 1023  Net IO Since Admission: 1,010.02 mL [03/22/22 1023]  Wt Readings from Last 3 Encounters:  03/21/22 94 kg  03/18/22 90.3 kg  03/11/22 91.4 kg     Unresulted Labs (From admission, onward)     Start     Ordered   03/25/22 0500  Creatinine, serum  (enoxaparin (LOVENOX)    CrCl >/= 30 ml/min)  Weekly,   R     Comments: while on enoxaparin therapy    03/18/22 1950   03/20/22 0500  CK  Daily,   R      03/19/22 1128          Data Reviewed: I have personally reviewed following labs and imaging  studies CBC: Recent Labs  Lab 03/18/22 2002 03/19/22 0406 03/20/22 0609  WBC 7.3 5.9 6.0  HGB 14.1 13.6 14.8  HCT 43.4 41.3 47.1  MCV 93.9 93.4 94.0  PLT 320 277 335   Basic Metabolic Panel: Recent Labs  Lab 03/18/22 2002 03/19/22 0406 03/20/22 0609  NA 139 138 135  K 3.8 3.4* 5.0  CL 102 102 102  CO2 28 27 24   GLUCOSE 99 97 176*  BUN 37* 41* 43*  CREATININE 1.07 1.01 1.00  CALCIUM 9.0 8.5* 8.9  MG 2.0  --   --    GFR: Estimated Creatinine Clearance: 75.3 mL/min (by C-G formula based on SCr of 1 mg/dL). Liver Function Tests: Recent Labs  Lab 03/18/22 2002 03/19/22 0406 03/20/22 0609  AST 255* 212* 211*  ALT 215* 175* 207*  ALKPHOS 59 47 50  BILITOT 0.5 0.7 0.5  PROT 6.2* 5.6* 6.4*  ALBUMIN 3.3* 2.8* 3.1*  Recent Labs  Lab 03/18/22 1757  GLUCAP 98   Antimicrobials: Anti-infectives (From admission, onward)    Start     Dose/Rate Route Frequency Ordered Stop   03/23/22 0600  ceFAZolin (ANCEF) IVPB 2g/100 mL premix        2 g 200 mL/hr over 30 Minutes Intravenous On call to O.R. 03/20/22 0931 03/24/22 0559   03/20/22 0600  ceFAZolin (ANCEF) IVPB 2g/100 mL premix  Status:  Discontinued        2 g 200 mL/hr over 30 Minutes Intravenous On call to O.R. 03/19/22 1129 03/20/22 0931      Culture/Microbiology No results found for: "SDES", "SPECREQUEST", "CULT", "REPTSTATUS"  Other culture-see note  Radiology Studies: No results found.   LOS: 4 days   Alma Friendly, MD [facility]  03/22/2022, 10:23 AM

## 2022-03-22 NOTE — Progress Notes (Addendum)
NEUROLOGY CONSULTATION PROGRESS NOTE   Date of service: March 22, 2022 Patient Name: Sean [name] MRN:  [id number] DOB:  1950/08/14  Brief HPI  Sean [name] is a 72 y.o. male with PMH significant for  has a past medical history of Alcohol addiction ([place]), Arrhythmia, Coronary artery disease, Depression, ED (erectile dysfunction), HLD (hyperlipidemia), HTN (hypertension), Mental disorder, Peptic ulcer, and RBBB (10/24/2015). who presents with proximal muscle weakness   Interval Hx   Weakness improved.  Vitals   Vitals:   03/21/22 1540 03/21/22 1914 03/22/22 0418 03/22/22 0724  BP: 138/74 129/70 129/65 130/79  Pulse: 74 75 81 72  Resp: 18 18 16 17   Temp: (!) 97.5 F (36.4 C) 98 F (36.7 C) 97.9 F (36.6 C) 98.8 F (37.1 C)  TempSrc: Oral Oral Oral Oral  SpO2: 100% 99% 99% 97%  Weight:      Height:         Body mass index is 31.51 kg/m.  Physical Exam   General: Laying comfortably in bed; in no acute distress.  HENT: Normal oropharynx and mucosa. Normal external appearance of ears and nose.  Neck: Supple, no pain or tenderness  CV: No JVD. No peripheral edema.  Pulmonary: Symmetric Chest rise. Normal respiratory effort.  Abdomen: Soft to touch, non-tender.  Ext: No cyanosis, edema, or deformity  Skin: No rash. Normal palpation of skin.   Musculoskeletal: Normal digits and nails by inspection. 1+ edema in extremities  Neurologic Examination  Mental status/Cognition: Alert, oriented to self, place, month and year, good attention.  Speech/language: Fluent, comprehension intact, object naming intact, repetition intact.  Cranial nerves:   CN II Pupils equal and reactive to light, no VF deficits    CN III,IV,VI EOM intact, no gaze preference or deviation, no nystagmus    CN V normal sensation in V1, V2, and V3 segments bilaterally    CN VII no asymmetry, no nasolabial fold flattening    CN VIII normal hearing to speech    CN IX & X normal palatal elevation, no uvular  deviation    CN XI 5/5 head turn and 5/5 shoulder shrug bilaterally    CN XII midline tongue protrusion    Motor:  Muscle bulk: normal, tone normal, pronator drift none tremor none Mvmt Root Nerve  Muscle Right Left Comments  SA C5/6 Ax Deltoid 4+ 4+   EF C5/6 Mc Biceps     EE C6/7/8 Rad Triceps     WF C6/7 Med FCR     WE C7/8 PIN ECU     F Ab C8/T1 U ADM/FDI     HF L1/2/3 Fem Illopsoas 4+ 4+   KE L2/3/4 Fem Quad     DF L4/5 D Peron Tib Ant     PF S1/2 Tibial Grc/Sol       *Sensory: Intact to light touch, pinprick, temperature vibration throughout. Symmetric. Propioception intact bilat.  No double-simultaneous extinction.  *Coordination:  Finger-to-nose, heel-to-shin, rapid alternating motions were intact. *Reflexes:  2+ and symmetric throughout without clonus; toes down-going bilat Stance: improved. Now able to stand up from seated position with arms crossed.  Labs   Basic Metabolic Panel:  Lab Results  Component Value Date   NA 135 03/20/2022   K 5.0 03/20/2022   CO2 24 03/20/2022   GLUCOSE 176 (H) 03/20/2022   BUN 43 (H) 03/20/2022   CREATININE 1.00 03/20/2022   CALCIUM 8.9 03/20/2022   GFRNONAA >60 03/20/2022   GFRAA >90 05/04/2011   HbA1c:  No results found for: "HGBA1C" LDL:  Lab Results  Component Value Date   LDLCALC 107 (H) 01/27/2022   Urine Drug Screen:     Component Value Date/Time   LABOPIA NONE DETECTED 04/29/2011 0901   COCAINSCRNUR NONE DETECTED 04/29/2011 0901   LABBENZ NONE DETECTED 04/29/2011 0901   AMPHETMU NONE DETECTED 04/29/2011 0901   THCU NONE DETECTED 04/29/2011 0901   LABBARB NONE DETECTED 04/29/2011 0901    Alcohol Level     Component Value Date/Time   ETH 113 (H) 04/29/2011 0856   No results found for: "PHENYTOIN", "ZONISAMIDE", "LAMOTRIGINE", "LEVETIRACETA" No results found for: "PHENYTOIN", "PHENOBARB", "VALPROATE", "CBMZ"  Imaging and Diagnostic studies    ANA+ with fine nuclear speckled pattern. FANA pattern is  homogenous.  Myomarker 3plus is pending  MRI Femur L w + w/o c 1. Scattered regions of muscular edema in the left thigh, primarily involving the adductor musculature, sartorius, semimembranosus, and long-head biceps femora, with relative sparing of the gracilis, semitendinosus, and short head of the biceps femoris. There is a lesser degree of edema in the adductor magnus muscle and relative sparing of the gracilis, semitendinosus, and short head of the biceps femoris. Similar findings appear to be present in the right thigh region on coronal images. The appearance is compatible with nonspecific myositis and differential diagnostic considerations include polymyositis, dermatomyositis, inclusion body myositis, connective tissue disease associated myositis, autoimmune neuropathy, or rhabdomyolysis. 2. Degenerative arthropathy of the left hip joint, although assessment is limited due to boundary artifact.  Impression   Sean [name] is a 72 y.o. male with PMH significant for Alcohol addiction ([place]), Arrhythmia, Coronary artery disease, Depression, ED (erectile dysfunction), HLD (hyperlipidemia), HTN (hypertension), Mental disorder, Peptic ulcer, and RBBB (10/24/2015) . His neurologic examination is notable for proximal muscle weakness greater than distal b/l symmetrical weakness with lower extremities worse than uppers and myalgias. Presentation consistent with myositis. With the noted ANA + and FANA pattern, suspect this could be secondary to a systemic autoimmune process.  Recommendations  - Surgery team planning muscle biopsy on monday -Trend CK levels-improving - Myomarker 3 plus panel was ordered which evaluates for myositis specific antibodies, myositis associated antibodies along with HMGCoA reductase Ab and Anti-cN-1A Ab.  - Continue solumedrol 1000mg  IV daily x 5 doses (last dose 2/5 in AM). Will need prolonged PO taper after IV steroids are completed. Okay to do 60mg  PO prednisone  daily at discharge for 30 days, then decrease to 40mg  daily for another month then continue at 20mg  thereafter. - EMG/NCS outpatient by Dr. Narda Amber with [facility] neurology. - follow up with Dr. Benjamine Mola with Rheumatology.  ______________________________________________________________________  Plan discussed with Dr. Horris Latino over secure chat.  Thank you for the opportunity to take part in the care of this patient. If you have any further questions, please contact the neurology consultation attending.  Signed,  Coralyn Pear, MD PGY-1 Psychiatry

## 2022-03-22 NOTE — Plan of Care (Signed)

## 2022-03-22 NOTE — Progress Notes (Signed)
Patient refused 2 doses of enoxaparin for VTE prophylaxis (2/2 and 2/3). Discussed with provider, SCDs ordered.  Jeneen Rinks, Pharm.D PGY1 Pharmacy Resident 03/22/2022 1:36 PM

## 2022-03-22 NOTE — Progress Notes (Signed)
VASCULAR LAB    Bilateral lower extremity venous duplex has been performed.  See CV proc for preliminary results.   Xinyi Batton, RVT 03/22/2022, 9:42 AM

## 2022-03-23 ENCOUNTER — Ambulatory Visit: Payer: PPO | Admitting: Internal Medicine

## 2022-03-23 ENCOUNTER — Inpatient Hospital Stay (HOSPITAL_COMMUNITY): Payer: PPO | Admitting: Anesthesiology

## 2022-03-23 ENCOUNTER — Other Ambulatory Visit: Payer: Self-pay

## 2022-03-23 ENCOUNTER — Encounter (HOSPITAL_COMMUNITY): Payer: Self-pay | Admitting: Family Medicine

## 2022-03-23 ENCOUNTER — Encounter (HOSPITAL_COMMUNITY)
Admission: AD | Disposition: A | Discharge status: Home / Self Care | Payer: Self-pay | Source: Home / Self Care | Attending: Internal Medicine

## 2022-03-23 DIAGNOSIS — E039 Hypothyroidism, unspecified: Secondary | ICD-10-CM | POA: Diagnosis not present

## 2022-03-23 DIAGNOSIS — R748 Abnormal levels of other serum enzymes: Secondary | ICD-10-CM | POA: Diagnosis not present

## 2022-03-23 DIAGNOSIS — G729 Myopathy, unspecified: Secondary | ICD-10-CM | POA: Diagnosis not present

## 2022-03-23 DIAGNOSIS — I10 Essential (primary) hypertension: Secondary | ICD-10-CM | POA: Diagnosis not present

## 2022-03-23 DIAGNOSIS — Z87891 Personal history of nicotine dependence: Secondary | ICD-10-CM | POA: Diagnosis not present

## 2022-03-23 DIAGNOSIS — M6089 Other myositis, multiple sites: Secondary | ICD-10-CM | POA: Diagnosis not present

## 2022-03-23 DIAGNOSIS — I2510 Atherosclerotic heart disease of native coronary artery without angina pectoris: Secondary | ICD-10-CM | POA: Diagnosis not present

## 2022-03-23 DIAGNOSIS — M609 Myositis, unspecified: Secondary | ICD-10-CM | POA: Diagnosis not present

## 2022-03-23 HISTORY — PX: MUSCLE BIOPSY: SHX716

## 2022-03-23 LAB — CBC WITH DIFFERENTIAL/PLATELET
Abs Immature Granulocytes: 0.09 10*3/uL — ABNORMAL HIGH (ref 0.00–0.07)
Basophils Absolute: 0 10*3/uL (ref 0.0–0.1)
Basophils Relative: 0 %
Eosinophils Absolute: 0 10*3/uL (ref 0.0–0.5)
Eosinophils Relative: 0 %
HCT: 40.9 % (ref 39.0–52.0)
Hemoglobin: 13.4 g/dL (ref 13.0–17.0)
Immature Granulocytes: 1 %
Lymphocytes Relative: 8 %
Lymphs Abs: 0.7 10*3/uL (ref 0.7–4.0)
MCH: 30.7 pg (ref 26.0–34.0)
MCHC: 32.8 g/dL (ref 30.0–36.0)
MCV: 93.6 fL (ref 80.0–100.0)
Monocytes Absolute: 0.6 10*3/uL (ref 0.1–1.0)
Monocytes Relative: 6 %
Neutro Abs: 7.6 10*3/uL (ref 1.7–7.7)
Neutrophils Relative %: 85 %
Platelets: 296 10*3/uL (ref 150–400)
RBC: 4.37 MIL/uL (ref 4.22–5.81)
RDW: 14.1 % (ref 11.5–15.5)
WBC: 9 10*3/uL (ref 4.0–10.5)
nRBC: 0 % (ref 0.0–0.2)

## 2022-03-23 LAB — COMPREHENSIVE METABOLIC PANEL
ALT: 258 U/L — ABNORMAL HIGH (ref 0–44)
AST: 135 U/L — ABNORMAL HIGH (ref 15–41)
Albumin: 2.8 g/dL — ABNORMAL LOW (ref 3.5–5.0)
Alkaline Phosphatase: 45 U/L (ref 38–126)
Anion gap: 8 (ref 5–15)
BUN: 43 mg/dL — ABNORMAL HIGH (ref 8–23)
CO2: 23 mmol/L (ref 22–32)
Calcium: 8.3 mg/dL — ABNORMAL LOW (ref 8.9–10.3)
Chloride: 104 mmol/L (ref 98–111)
Creatinine, Ser: 0.86 mg/dL (ref 0.61–1.24)
GFR, Estimated: >60 mL/min (ref >60)
Glucose, Bld: 128 mg/dL — ABNORMAL HIGH (ref 70–99)
Potassium: 3.6 mmol/L (ref 3.5–5.1)
Sodium: 135 mmol/L (ref 135–145)
Total Bilirubin: 0.6 mg/dL (ref 0.3–1.2)
Total Protein: 5.4 g/dL — ABNORMAL LOW (ref 6.5–8.1)

## 2022-03-23 LAB — CK: Total CK: 1164 U/L — ABNORMAL HIGH (ref 49–397)

## 2022-03-23 SURGERY — MUSCLE BIOPSY
Anesthesia: Monitor Anesthesia Care | Site: Thigh | Laterality: Left

## 2022-03-23 MED ORDER — PREDNISONE 20 MG PO TABS: 60.0000 mg | ORAL_TABLET | Freq: Every day | ORAL | 0 refills | Status: DC

## 2022-03-23 MED ORDER — PROPOFOL 500 MG/50ML IV EMUL
INTRAVENOUS | Status: DC | PRN
  Administered 2022-03-23: 50 ug/kg/min via INTRAVENOUS

## 2022-03-23 MED ORDER — HYDROCODONE-ACETAMINOPHEN 5-325 MG PO TABS: 1.0000 | ORAL_TABLET | Freq: Four times a day (QID) | ORAL | 0 refills | Status: AC | PRN

## 2022-03-23 MED ORDER — CHLORHEXIDINE GLUCONATE 0.12 % MT SOLN: 15.0000 mL | Freq: Once | OROMUCOSAL | Status: AC

## 2022-03-23 MED ORDER — FENTANYL CITRATE (PF) 250 MCG/5ML IJ SOLN
INTRAMUSCULAR | Status: AC
  Filled 2022-03-23: qty 5

## 2022-03-23 MED ORDER — ORAL CARE MOUTH RINSE: 15.0000 mL | Freq: Once | OROMUCOSAL | Status: AC

## 2022-03-23 MED ORDER — FENTANYL CITRATE (PF) 250 MCG/5ML IJ SOLN
INTRAMUSCULAR | Status: DC | PRN
  Administered 2022-03-23 (×2): 50 ug via INTRAVENOUS

## 2022-03-23 MED ORDER — MIDAZOLAM HCL 2 MG/2ML IJ SOLN
INTRAMUSCULAR | Status: DC | PRN
  Administered 2022-03-23: 1 mg via INTRAVENOUS

## 2022-03-23 MED ORDER — ACETAMINOPHEN 10 MG/ML IV SOLN: 1000.0000 mg | Freq: Once | INTRAVENOUS | Status: DC | PRN

## 2022-03-23 MED ORDER — BUPIVACAINE LIPOSOME 1.3 % IJ SUSP
INTRAMUSCULAR | Status: AC
  Filled 2022-03-23: qty 20

## 2022-03-23 MED ORDER — FENTANYL CITRATE (PF) 100 MCG/2ML IJ SOLN: 25.0000 ug | INTRAMUSCULAR | Status: DC | PRN

## 2022-03-23 MED ORDER — MIDAZOLAM HCL 2 MG/2ML IJ SOLN
INTRAMUSCULAR | Status: AC
  Filled 2022-03-23: qty 2

## 2022-03-23 MED ORDER — OXYCODONE HCL 5 MG/5ML PO SOLN: 5.0000 mg | Freq: Once | ORAL | Status: DC | PRN

## 2022-03-23 MED ORDER — BUPIVACAINE HCL (PF) 0.25 % IJ SOLN
INTRAMUSCULAR | Status: AC
  Filled 2022-03-23: qty 30

## 2022-03-23 MED ORDER — CHLORHEXIDINE GLUCONATE 0.12 % MT SOLN
OROMUCOSAL | Status: AC
  Administered 2022-03-23: 15 mL via OROMUCOSAL
  Filled 2022-03-23: qty 15

## 2022-03-23 MED ORDER — ONDANSETRON HCL 4 MG/2ML IJ SOLN
INTRAMUSCULAR | Status: AC
  Filled 2022-03-23: qty 2

## 2022-03-23 MED ORDER — PANTOPRAZOLE SODIUM 40 MG PO TBEC: 40.0000 mg | DELAYED_RELEASE_TABLET | Freq: Every day | ORAL | 0 refills | Status: AC

## 2022-03-23 MED ORDER — LIDOCAINE 2% (20 MG/ML) 5 ML SYRINGE
INTRAMUSCULAR | Status: DC | PRN
  Administered 2022-03-23: 40 mg via INTRAVENOUS

## 2022-03-23 MED ORDER — ACETAMINOPHEN 500 MG PO TABS: 1000.0000 mg | ORAL_TABLET | Freq: Once | ORAL | Status: DC | PRN

## 2022-03-23 MED ORDER — LACTATED RINGERS IV SOLN: INTRAVENOUS | Status: DC

## 2022-03-23 MED ORDER — PROPOFOL 10 MG/ML IV BOLUS
INTRAVENOUS | Status: DC | PRN
  Administered 2022-03-23: 50 mg via INTRAVENOUS
  Administered 2022-03-23: 40 mg via INTRAVENOUS

## 2022-03-23 MED ORDER — LIDOCAINE 2% (20 MG/ML) 5 ML SYRINGE
INTRAMUSCULAR | Status: AC
  Filled 2022-03-23: qty 5

## 2022-03-23 MED ORDER — ACETAMINOPHEN 160 MG/5ML PO SOLN: 1000.0000 mg | Freq: Once | ORAL | Status: DC | PRN

## 2022-03-23 MED ORDER — PREDNISONE 20 MG PO TABS
60.0000 mg | ORAL_TABLET | Freq: Every day | ORAL | Status: DC
  Administered 2022-03-24: 60 mg via ORAL
  Filled 2022-03-23: qty 3

## 2022-03-23 MED ORDER — OXYCODONE HCL 5 MG PO TABS: 5.0000 mg | ORAL_TABLET | Freq: Once | ORAL | Status: DC | PRN

## 2022-03-23 MED ORDER — BUPIVACAINE LIPOSOME 1.3 % IJ SUSP
INTRAMUSCULAR | Status: DC | PRN
  Administered 2022-03-23: 20 mL via SURGICAL_CAVITY

## 2022-03-23 MED ORDER — PANTOPRAZOLE SODIUM 40 MG PO TBEC: 40.0000 mg | DELAYED_RELEASE_TABLET | Freq: Every day | ORAL | Status: DC

## 2022-03-23 MED ORDER — ONDANSETRON HCL 4 MG/2ML IJ SOLN
INTRAMUSCULAR | Status: DC | PRN
  Administered 2022-03-23: 4 mg via INTRAVENOUS

## 2022-03-23 SURGICAL SUPPLY — 31 items
APPLICATOR CHLORAPREP 10.5 ORG (MISCELLANEOUS) ×1 IMPLANT
CANISTER SUCT 3000ML PPV (MISCELLANEOUS) IMPLANT
CNTNR URN SCR LID CUP LEK RST (MISCELLANEOUS) ×1 IMPLANT
CONT SPEC 4OZ STRL OR WHT (MISCELLANEOUS) ×1
COVER SURGICAL LIGHT HANDLE (MISCELLANEOUS) ×1 IMPLANT
DERMABOND ADVANCED .7 DNX12 (GAUZE/BANDAGES/DRESSINGS) ×1 IMPLANT
DRSG DERMACEA 8X12 NADH (GAUZE/BANDAGES/DRESSINGS) IMPLANT
DRSG TELFA 3X8 NADH STRL (GAUZE/BANDAGES/DRESSINGS) ×1 IMPLANT
ELECT REM PT RETURN 9FT ADLT (ELECTROSURGICAL) ×1
ELECTRODE REM PT RTRN 9FT ADLT (ELECTROSURGICAL) ×1 IMPLANT
GLOVE BIO SURGEON STRL SZ 6 (GLOVE) ×1 IMPLANT
GLOVE INDICATOR 6.5 STRL GRN (GLOVE) ×1 IMPLANT
GOWN STRL REUS W/ TWL LRG LVL3 (GOWN DISPOSABLE) ×1 IMPLANT
GOWN STRL REUS W/ TWL XL LVL3 (GOWN DISPOSABLE) ×2 IMPLANT
GOWN STRL REUS W/TWL LRG LVL3 (GOWN DISPOSABLE) ×1
GOWN STRL REUS W/TWL XL LVL3 (GOWN DISPOSABLE) ×2
KIT BASIN OR (CUSTOM PROCEDURE TRAY) ×1 IMPLANT
KIT TURNOVER KIT B (KITS) ×1 IMPLANT
NDL HYPO 25GX1X1/2 BEV (NEEDLE) ×1 IMPLANT
NEEDLE HYPO 25GX1X1/2 BEV (NEEDLE) ×1 IMPLANT
NS IRRIG 1000ML POUR BTL (IV SOLUTION) ×1 IMPLANT
PACK GENERAL/GYN (CUSTOM PROCEDURE TRAY) ×1 IMPLANT
PAD ARMBOARD 7.5X6 YLW CONV (MISCELLANEOUS) ×2 IMPLANT
PENCIL SMOKE EVACUATOR (MISCELLANEOUS) ×1 IMPLANT
SUT MON AB 4-0 PC3 18 (SUTURE) ×1 IMPLANT
SUT VIC AB 3-0 SH 18 (SUTURE) ×1 IMPLANT
SYR CONTROL 10ML LL (SYRINGE) ×1 IMPLANT
TOWEL GREEN STERILE (TOWEL DISPOSABLE) ×1 IMPLANT
TOWEL GREEN STERILE FF (TOWEL DISPOSABLE) ×1 IMPLANT
TUBE CONNECTING 12X1/4 (SUCTIONS) IMPLANT
YANKAUER SUCT BULB TIP NO VENT (SUCTIONS) IMPLANT

## 2022-03-23 NOTE — Progress Notes (Signed)
NEUROLOGY CONSULTATION PROGRESS NOTE   Date of service: March 23, 2022 Patient Name: Sean [name] MRN:  [id number] DOB:  10/30/1950  Brief HPI  Sean [name] is a 72 y.o. male with PMH significant for  has a past medical history of Alcohol addiction ([place]), Arrhythmia, Coronary artery disease, Depression, ED (erectile dysfunction), HLD (hyperlipidemia), HTN (hypertension), Mental disorder, Peptic ulcer, and RBBB (10/24/2015). who presents with proximal muscle weakness   Interval Hx   Continuing improvement of weakness.  Walking without support.  Vitals   Vitals:   03/22/22 1629 03/22/22 1955 03/23/22 0608 03/23/22 0812  BP: (!) 140/79 (!) 150/78 126/72 127/72  Pulse: 74 75 72 70  Resp: 17 18 18 18   Temp: 98 F (36.7 C) (!) 97.5 F (36.4 C) 98.1 F (36.7 C) 98.5 F (36.9 C)  TempSrc: Oral Oral Oral Oral  SpO2: 95% 97% 96% 98%  Weight:      Height:         Body mass index is 31.51 kg/m.  Physical Exam   General: Laying comfortably in bed; in no acute distress.  HENT: Normal oropharynx and mucosa. Normal external appearance of ears and nose.  Neck: Supple, no pain or tenderness  CV: No JVD. No peripheral edema.  Pulmonary: Symmetric Chest rise. Normal respiratory effort.  Abdomen: Soft to touch, non-tender.  Ext: No cyanosis, edema, or deformity  Skin: No rash. Normal palpation of skin.   Musculoskeletal: Normal digits and nails by inspection. 1+ edema in extremities  Neurologic Examination  Mental status/Cognition: Alert, oriented to self, place, month and year, good attention.  Speech/language: Fluent, comprehension intact, object naming intact, repetition intact.  Cranial nerves:   CN II Pupils equal and reactive to light, no VF deficits    CN III,IV,VI EOM intact, no gaze preference or deviation, no nystagmus    CN V normal sensation in V1, V2, and V3 segments bilaterally    CN VII no asymmetry, no nasolabial fold flattening    CN VIII normal hearing to speech     CN IX & X normal palatal elevation, no uvular deviation    CN XI 5/5 head turn and 5/5 shoulder shrug bilaterally    CN XII midline tongue protrusion    Motor:  Muscle bulk: normal, tone normal, proximal and distal strength are nearly 5/5 Sensory: Intact to light touch, pinprick, temperature vibration throughout. Symmetric. Propioception intact bilat.  No double-simultaneous extinction.  Coordination:  Finger-to-nose, heel-to-shin, rapid alternating motions were intact. Reflexes:  2+ and symmetric throughout without clonus; toes down-going bilat Stance: improved. Now able to stand up from seated position with arms crossed.  Labs   Basic Metabolic Panel:  Lab Results  Component Value Date   NA 135 03/23/2022   K 3.6 03/23/2022   CO2 23 03/23/2022   GLUCOSE 128 (H) 03/23/2022   BUN 43 (H) 03/23/2022   CREATININE 0.86 03/23/2022   CALCIUM 8.3 (L) 03/23/2022   GFRNONAA >60 03/23/2022   GFRAA >90 05/04/2011   Imaging and Diagnostic studies    ANA+ with fine nuclear speckled pattern. FANA pattern is homogenous.  Myomarker 3plus is pending  MRI Femur L w + w/o c 1. Scattered regions of muscular edema in the left thigh, primarily involving the adductor musculature, sartorius, semimembranosus, and long-head biceps femora, with relative sparing of the gracilis, semitendinosus, and short head of the biceps femoris. There is a lesser degree of edema in the adductor magnus muscle and relative sparing of the gracilis, semitendinosus,  and short head of the biceps femoris. Similar findings appear to be present in the right thigh region on coronal images. The appearance is compatible with nonspecific myositis and differential diagnostic considerations include polymyositis, dermatomyositis, inclusion body myositis, connective tissue disease associated myositis, autoimmune neuropathy, or rhabdomyolysis. 2. Degenerative arthropathy of the left hip joint, although assessment is limited  due to boundary artifact.  Impression   Sean [name] is a 72 y.o. male with PMH significant for Alcohol addiction ([place]), Arrhythmia, Coronary artery disease, Depression, ED (erectile dysfunction), HLD (hyperlipidemia), HTN (hypertension), and RBBB (10/24/2015) . His neurologic examination is notable for proximal muscle weakness greater than distal b/l symmetrical weakness with lower extremities worse than uppers and myalgias. Presentation consistent with myositis. With the noted ANA + and FANA pattern, suspect this could be secondary to a systemic autoimmune process.  Significant improvement with steroids.  CK trending up.  Recommendations  -Muscle biopsy-follow-up results outpatient. -Trend CK levels-improving - Myomarker 3 plus panel was ordered which evaluates for myositis specific antibodies, myositis associated antibodies along with HMGCoA reductase Ab and Anti-cN-1A Ab.  - Continue solumedrol 1000mg  IV daily x 5 doses (last dose 2/5 in AM). Will need prolonged PO taper after IV steroids are completed. Okay to do 60mg  PO prednisone daily at discharge for 30 days, then decrease to 40mg  daily for another month then continue at 20mg  thereafter. - EMG/NCS outpatient by Dr. Narda Amber with [facility] neurology. - follow up with Dr. Benjamine Mola with Rheumatology.  ______________________________________________________________________  Plan discussed with Dr. Horris Latino over secure chat.  Neurology will be available as needed basis.  -- Amie Portland, MD Neurologist [facility] Pager: [phone]

## 2022-03-23 NOTE — Progress Notes (Signed)
Mobility Specialist Progress Note:   03/23/22 1105  Mobility  Activity Ambulated with assistance in hallway  Level of Assistance Independent after set-up  Assistive Device None  Distance Ambulated (ft) 350 ft  Activity Response Tolerated well  $Mobility charge 1 Mobility   Pt in chair willing to participate in mobility. No complaints of pain. Left in chair with call bell in reach and all needs met.   Gareth Eagle Arleigh Odowd Mobility Specialist Please contact via [employer] or  Rehab Office at [phone]

## 2022-03-23 NOTE — Progress Notes (Signed)
General Surgery Follow Up Note  Subjective:    Overnight Issues:   Objective:  Vital signs for last 24 hours: Temp:  [97.5 F (36.4 C)-98.5 F (36.9 C)] 98.3 F (36.8 C) (02/05 1419) Pulse Rate:  [70-76] 76 (02/05 1419) Resp:  [17-18] 17 (02/05 1419) BP: (126-150)/(72-84) 139/84 (02/05 1419) SpO2:  [95 %-98 %] 98 % (02/05 1419)  Hemodynamic parameters for last 24 hours:    Intake/Output from previous day: No intake/output data recorded.  Intake/Output this shift: No intake/output data recorded.  Vent settings for last 24 hours:    Physical Exam:  Gen: comfortable, no distress Neuro: non-focal exam HEENT: PERRL Neck: supple CV: RRR Pulm: unlabored breathing Abd: soft, NT GU: clear yellow urine Extr: wwp, no edema   Results for orders placed or performed during the hospital encounter of 03/18/22 (from the past 24 hour(s))  CK     Status: Abnormal   Collection Time: 03/23/22  3:46 AM  Result Value Ref Range   Total CK 1,164 (H) 49 - 397 U/L  CBC with Differential/Platelet     Status: Abnormal   Collection Time: 03/23/22  3:46 AM  Result Value Ref Range   WBC 9.0 4.0 - 10.5 K/uL   RBC 4.37 4.22 - 5.81 MIL/uL   Hemoglobin 13.4 13.0 - 17.0 g/dL   HCT 40.9 39.0 - 52.0 %   MCV 93.6 80.0 - 100.0 fL   MCH 30.7 26.0 - 34.0 pg   MCHC 32.8 30.0 - 36.0 g/dL   RDW 14.1 11.5 - 15.5 %   Platelets 296 150 - 400 K/uL   nRBC 0.0 0.0 - 0.2 %   Neutrophils Relative % 85 %   Neutro Abs 7.6 1.7 - 7.7 K/uL   Lymphocytes Relative 8 %   Lymphs Abs 0.7 0.7 - 4.0 K/uL   Monocytes Relative 6 %   Monocytes Absolute 0.6 0.1 - 1.0 K/uL   Eosinophils Relative 0 %   Eosinophils Absolute 0.0 0.0 - 0.5 K/uL   Basophils Relative 0 %   Basophils Absolute 0.0 0.0 - 0.1 K/uL   Immature Granulocytes 1 %   Abs Immature Granulocytes 0.09 (H) 0.00 - 0.07 K/uL  Comprehensive metabolic panel     Status: Abnormal   Collection Time: 03/23/22  3:46 AM  Result Value Ref Range   Sodium 135  135 - 145 mmol/L   Potassium 3.6 3.5 - 5.1 mmol/L   Chloride 104 98 - 111 mmol/L   CO2 23 22 - 32 mmol/L   Glucose, Bld 128 (H) 70 - 99 mg/dL   BUN 43 (H) 8 - 23 mg/dL   Creatinine, Ser 0.86 0.61 - 1.24 mg/dL   Calcium 8.3 (L) 8.9 - 10.3 mg/dL   Total Protein 5.4 (L) 6.5 - 8.1 g/dL   Albumin 2.8 (L) 3.5 - 5.0 g/dL   AST 135 (H) 15 - 41 U/L   ALT 258 (H) 0 - 44 U/L   Alkaline Phosphatase 45 38 - 126 U/L   Total Bilirubin 0.6 0.3 - 1.2 mg/dL   GFR, Estimated >60 >60 mL/min   Anion gap 8 5 - 15    Assessment & Plan:  Present on Admission:  Coronary artery disease  Abnormal liver enzymes  HTN (hypertension)  Hypothyroidism  Myositis  Elevated transaminase level    LOS: 5 days   Additional comments:I reviewed the patient's new clinical lab test results.   and I reviewed the patients new imaging test results.  Myelopathy - plan for muscle bx today. Informed consent was obtained after detailed explanation of risks, including bleeding, infection, hematoma, abscess. All questions answered to the patient's satisfaction. FEN - NPO, okay for regular diet post-op Dispo - med-surg    Jesusita Oka, MD Trauma & General Surgery Please use [email or web address] to contact on call provider  03/23/2022  *Care during the described time interval was provided by me. I have reviewed this patient's available data, including medical history, events of note, physical examination and test results as part of my evaluation.

## 2022-03-23 NOTE — Progress Notes (Signed)
PROGRESS NOTE Sean [name]  VFI:[id number] DOB: February 17, 1950 DOA: 03/18/2022 PCP: [name], MD   Brief Narrative/Hospital Course: 72 y.o.m w/ HTN HLD, and CAD with remote PCI who presents to the hospital as direct admission for expedited muscle biopsy and initiation of myositis treatment. Patient reports months of progressive generalized weakness with mild muscle aches, primarily involving the shoulders,thighs,and trunk. 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  He has extremity swelling associated with this but no fevers or rash.  He was evaluated by cardiology for his activity intolerance and had echocardiogram with normal EF and no diastolic dysfunction. He was started on Lasix for his swelling, but has not improved with this. He had elevated transaminases that were noted to be increasing, saw GI for this, and had CT of the abdomen and pelvis performed that was negative for any acute hepatobiliary finding. He has a history of alcoholism but reports just 0-1 drinks per day for the past several months. Seen at pulmonology and had workup notable for serum CK of 6777, aldolase of 83, and positive ANA with concern for acute myositis, direct admission to the hospital was arranged in order to expedite muscle biopsy and initiation of treatment.    Subjective: Saw pt after muscle biopsy which was done late this evening. Scar on L thigh c/d/I without any dressing. Pt denies any new complaints. Requesting to be discharged early tomorrow morning.    Assessment and Plan: Principal Problem:   Myositis Active Problems:   Abnormal liver enzymes   HTN (hypertension)   Coronary artery disease   Hypothyroidism   Elevated transaminase level    Myositis Months of progressive proximal weakness and myalgias: Denies respiratory symptoms or dysphagia/speech issues Has a Hx of  Rauynad's for many years, but recently gotten worse Extensive workup as outpatient notable for aldolase positive, CK elevated, ANA positive Myomarker 3+ profile/rheumatoid factor/CCP antibody/RNP antibody collected 1/31 Esr, crp normal, Antiscleroderma antibody negative, hypersensitivity pneumonitis negative Appreciate neurology input, started on high-dose IV steroid Monitor CK for treatment response, currently downtrending Previous attending discussed with Dr. Dimple Casey from outpatient rheumatology clinic on 2/1 reviewed his chart, recommended ambulatory referral for close follow-up  Neurology recommends high-dose steroids while inpatient, and discharge on 60 mg PO prednisone daily for 30 days, then decrease to 40 mg daily for another 30 days and then 20 mg thereafter Continue PPI upon d/c S/P muscle biopsy on 03/23/22 by general surgery Outpt follow up with Dr. Nita Sickle from neurology for EMG and NCS  Weight loss Patient endorses about 20 pound weight loss since October will consult dietitian CT abdomen pelvis 12/27-no acute finding-no evidence of malignancy Outpatient follow-up to evaluate further etiology if weight does not improve  Elevated transaminases Saw GI as an outpatient, CT with no acute hepatobiliary findings as outpatient  Likely secondary to rhabdomyolysis/myositis +/- alcohol    CAD hx w/  remote BMS to LAD Edema of lower extremities and upper extremities: No chest pain. On Plavix and beta-blocker. At home on Lasix PRN, holding Elevate BLE Bilateral doppler negative DVT  Frequent PACs Cont metoprolol   Hypothyroidism Continue Synthroid   Class I Obesity Patient's Body mass index is 31.51 kg/m.     DVT prophylaxis: Place and maintain sequential compression device Start: 03/22/22 1332 enoxaparin (LOVENOX) injection 40 mg Start: 03/18/22 2100 Code Status:   Code  Status: Full Code Family Communication: None at bedside Patient status is: inpatient  because of  progressive weakness/myositis Level of care: Med-Surg   Dispo: The patient is from: home            Anticipated disposition: Home after biopsy     Objective: Vitals last 24 hrs: Vitals:   03/23/22 1540 03/23/22 1555 03/23/22 1610 03/23/22 1631  BP: 127/75 126/75 (!) 139/94 130/79  Pulse: 80 72 75 72  Resp: 13 12 15 18   Temp: 97.9 F (36.6 C)  98.3 F (36.8 C) (!) 97.5 F (36.4 C)  TempSrc:    Oral  SpO2: 94% 95% 97% 94%  Weight:      Height:       Weight change:   Physical Examination: General: NAD  Cardiovascular: S1, S2 present Respiratory: CTAB Abdomen: Soft, nontender, nondistended, bowel sounds present Musculoskeletal: bilateral pedal edema noted, noted proximal muscle weakness Skin: Scar on L thigh c/d/i Psychiatry: Normal mood    Medications reviewed:  Scheduled Meds:  enoxaparin (LOVENOX) injection  40 mg Subcutaneous Q24H   feeding supplement  237 mL Oral Q24H   levothyroxine  25 mcg Oral Q0600   lisinopril  20 mg Oral Daily   metoprolol succinate  50 mg Oral QPM   mupirocin ointment  1 Application Nasal BID   pantoprazole (PROTONIX) IV  40 mg Intravenous Q24H   Continuous Infusions:    Diet Order             Diet regular Room service appropriate? Yes; Fluid consistency: Thin  Diet effective now                   Intake/Output Summary (Last 24 hours) at 03/23/2022 1720 Last data filed at 03/23/2022 1532 Gross per 24 hour  Intake 400 ml  Output 0 ml  Net 400 ml    Net IO Since Admission: 1,410.02 mL [03/23/22 1720]  Wt Readings from Last 3 Encounters:  03/21/22 94 kg  03/18/22 90.3 kg  03/11/22 91.4 kg     Unresulted Labs (From admission, onward)     Start     Ordered   03/23/22 0500  CBC with Differential/Platelet  Daily,   R      03/22/22 1026   03/23/22 0500  Comprehensive metabolic panel  Daily,   R      03/22/22 1026   03/20/22 0500  CK  Daily,   R      03/19/22 1128          Data Reviewed: I have personally reviewed  following labs and imaging studies CBC: Recent Labs  Lab 03/18/22 2002 03/19/22 0406 03/20/22 0609 03/23/22 0346  WBC 7.3 5.9 6.0 9.0  NEUTROABS  --   --   --  7.6  HGB 14.1 13.6 14.8 13.4  HCT 43.4 41.3 47.1 40.9  MCV 93.9 93.4 94.0 93.6  PLT 320 277 337 096   Basic Metabolic Panel: Recent Labs  Lab 03/18/22 2002 03/19/22 0406 03/20/22 0609 03/23/22 0346  NA 139 138 135 135  K 3.8 3.4* 5.0 3.6  CL 102 102 102 104  CO2 28 27 24 23   GLUCOSE 99 97 176* 128*  BUN 37* 41* 43* 43*  CREATININE 1.07 1.01 1.00 0.86  CALCIUM 9.0 8.5* 8.9 8.3*  MG 2.0  --   --   --    GFR: Estimated Creatinine Clearance: 87.6 mL/min (by C-G formula based on SCr of 0.86 mg/dL). Liver Function Tests: Recent  Labs  Lab 03/18/22 2002 03/19/22 0406 03/20/22 0609 03/23/22 0346  AST 255* 212* 211* 135*  ALT 215* 175* 207* 258*  ALKPHOS 59 47 50 45  BILITOT 0.5 0.7 0.5 0.6  PROT 6.2* 5.6* 6.4* 5.4*  ALBUMIN 3.3* 2.8* 3.1* 2.8*   Recent Labs  Lab 03/18/22 1757  GLUCAP 98   Antimicrobials: Anti-infectives (From admission, onward)    Start     Dose/Rate Route Frequency Ordered Stop   03/23/22 0600  ceFAZolin (ANCEF) IVPB 2g/100 mL premix        2 g 200 mL/hr over 30 Minutes Intravenous On call to O.R. 03/20/22 0931 03/23/22 1501   03/20/22 0600  ceFAZolin (ANCEF) IVPB 2g/100 mL premix  Status:  Discontinued        2 g 200 mL/hr over 30 Minutes Intravenous On call to O.R. 03/19/22 1129 03/20/22 0931      Culture/Microbiology No results found for: "SDES", "SPECREQUEST", "CULT", "REPTSTATUS"  Other culture-see note  Radiology Studies: VAS [place] LOWER EXTREMITY VENOUS (DVT)  Result Date: 03/23/2022  Lower Venous DVT Study Patient Name:  JULIEN SKARDA  Date of Exam:   03/22/2022 Medical Rec #: [id number]     Accession #:    [id number] Date of Birth: 08/08/1950     Patient Gender: M Patient Age:   80 years Exam Location:  [facility] Procedure:      VAS [place] LOWER EXTREMITY VENOUS (DVT)  Referring Phys: [facility] Umeka Wrench --------------------------------------------------------------------------------  Indications: Swelling, and Myositis.  Comparison Study: No prior study on file Performing Technologist: Sharion Dove RVS  Examination Guidelines: A complete evaluation includes B-mode imaging, spectral Doppler, color Doppler, and power Doppler as needed of all accessible portions of each vessel. Bilateral testing is considered an integral part of a complete examination. Limited examinations for reoccurring indications may be performed as noted. The reflux portion of the exam is performed with the patient in reverse Trendelenburg.  +--------+---------------+---------+-----------+----------------+-------------+ RIGHT   CompressibilityPhasicitySpontaneityProperties      Thrombus                                                                 Aging         +--------+---------------+---------+-----------+----------------+-------------+ CFV     Full                               pulsatile                                                                waveforms                     +--------+---------------+---------+-----------+----------------+-------------+ SFJ     Full                                                             +--------+---------------+---------+-----------+----------------+-------------+  FV Prox Full                                                             +--------+---------------+---------+-----------+----------------+-------------+ FV Mid  Full                                                             +--------+---------------+---------+-----------+----------------+-------------+ FV      Full                                                             Distal                                                                   +--------+---------------+---------+-----------+----------------+-------------+ PFV     Full                                                              +--------+---------------+---------+-----------+----------------+-------------+ POP     Full                               pulsatile                                                                waveforms                     +--------+---------------+---------+-----------+----------------+-------------+ PTV     Full                                                             +--------+---------------+---------+-----------+----------------+-------------+ PERO    Full                                                             +--------+---------------+---------+-----------+----------------+-------------+   +--------+---------------+---------+-----------+----------------+-------------+ LEFT    CompressibilityPhasicitySpontaneityProperties  Thrombus                                                                 Aging         +--------+---------------+---------+-----------+----------------+-------------+ CFV     Full                               pulsatile                                                                waveforms                     +--------+---------------+---------+-----------+----------------+-------------+ SFJ     Full                                                             +--------+---------------+---------+-----------+----------------+-------------+ FV Prox Full                                                             +--------+---------------+---------+-----------+----------------+-------------+ FV Mid  Full                                                             +--------+---------------+---------+-----------+----------------+-------------+ FV      Full                                                             Distal                                                                    +--------+---------------+---------+-----------+----------------+-------------+ PFV     Full                                                             +--------+---------------+---------+-----------+----------------+-------------+ POP  Full                               pulsatile                                                                waveforms                     +--------+---------------+---------+-----------+----------------+-------------+ PTV     Full                                                             +--------+---------------+---------+-----------+----------------+-------------+ PERO    Full                                                             +--------+---------------+---------+-----------+----------------+-------------+     Summary: BILATERAL: - No evidence of deep vein thrombosis seen in the lower extremities, bilaterally. -No evidence of popliteal cyst, bilaterally. RIGHT: pulsatile waveforms  LEFT: Pulsatile waveforms.  *See table(s) above for measurements and observations. Electronically signed by Servando Snare MD on 03/23/2022 at 2:37:52 PM.    Final      LOS: 5 days   Alma Friendly, MD [facility]  03/23/2022, 5:20 PM

## 2022-03-23 NOTE — Anesthesia Procedure Notes (Signed)
Procedure Name: General with mask airway Date/Time: 03/23/2022 2:45 PM  Performed by: Lorie Phenix, CRNAPre-anesthesia Checklist: Patient identified, Emergency Drugs available, Suction available and Patient being monitored Patient Re-evaluated:Patient Re-evaluated prior to induction Oxygen Delivery Method: Circle system utilized Preoxygenation: Pre-oxygenation with 100% oxygen Ventilation: Mask ventilation without difficulty Placement Confirmation: positive ETCO2

## 2022-03-23 NOTE — Transfer of Care (Signed)
Immediate Anesthesia Transfer of Care Note  Patient: Sean [name]  Procedure(s) Performed: LEFT THIGH MUSCLE BIOPSY (Left: Thigh)  Patient Location: PACU  Anesthesia Type:MAC  Level of Consciousness: awake and alert   Airway & Oxygen Therapy: Patient Spontanous Breathing  Post-op Assessment: Report given to RN and Post -op Vital signs reviewed and stable  Post vital signs: Reviewed and stable  Last Vitals:  Vitals Value Taken Time  BP 127/75 03/23/22 1540  Temp 36.6 C 03/23/22 1540  Pulse 74 03/23/22 1544  Resp 0 03/23/22 1544  SpO2 94 % 03/23/22 1544  Vitals shown include unvalidated device data.  Last Pain:  Vitals:   03/23/22 1540  TempSrc:   PainSc: 0-No pain         Complications: No notable events documented.

## 2022-03-23 NOTE — Op Note (Signed)
   Operative Note   Date: 03/23/2022  Procedure: muscle biopsy, left thigh  Pre-op diagnosis: myopathy Post-op diagnosis: same  Indication and clinical history: The patient is a 72 y.o. year old male with myopathy of unclear etiology     Surgeon: Jesusita Oka, MD  Anesthesiologist: Ermalene Postin, MD Anesthesia: General  Findings:  Specimen: muscle biopsy, left thigh  EBL: <5cc Drains/Implants: none  Disposition: PACU - hemodynamically stable.  Description of procedure: The patient was positioned supine on the operating room table. General anesthetic induction and intubation were uneventful. Time-out was performed verifying correct patient, procedure, laterality, signature of informed consent, and administration of pre-operative antibiotics. The left thigh was prepped and draped in the usual sterile fashion.  A longitunidinal incision was made and deepend until the fascia was reached. The fascia was incised sharply using a #15 blade scalpel and extended superiorly and inferiorly using Metzenbaum scissors. A segment of muscle was separated with Kelly clamps and excised using electrocautery. The specimen was sent to pathology as a fresh specimen. Hemostasis was confirmed. The fascia was closed with running 3-0 vicryl suture. The wound was closed in layers with two layers of 3-0 vicryl and a subcuticular layer of 4-0 monocryl. Local anesthetic using a 1:! Mixture of plain marcaine and Exparel was infiltrated. Dermabond was applied as sterile dressing.   All sponge and instrument counts were correct at the conclusion of the procedure. The patient was awakened from anesthesia, extubated uneventfully, and transported to the PACU in good condition. There were no complications.    Jesusita Oka, MD General and [facility]

## 2022-03-23 NOTE — Anesthesia Preprocedure Evaluation (Addendum)
Anesthesia Evaluation  Patient identified by MRN, date of birth, ID band Patient awake    Reviewed: Allergy & Precautions, NPO status , Patient's Chart, lab work & pertinent test results  History of Anesthesia Complications Negative for: history of anesthetic complications  Airway Mallampati: II  TM Distance: >3 FB Neck ROM: Full    Dental  (+) Teeth Intact, Dental Advisory Given   Pulmonary former smoker   breath sounds clear to auscultation       Cardiovascular hypertension, Pt. on medications (-) angina + CAD and + Cardiac Stents   Rhythm:Regular     Neuro/Psych  PSYCHIATRIC DISORDERS  Depression     Neuromuscular disease    GI/Hepatic Neg liver ROS, PUD,,,  Endo/Other  Hypothyroidism    Renal/GU negative Renal ROS     Musculoskeletal   Abdominal   Peds  Hematology negative hematology ROS (+) Lab Results      Component                Value               Date                      WBC                      9.0                 03/23/2022                HGB                      13.4                03/23/2022                HCT                      40.9                03/23/2022                MCV                      93.6                03/23/2022                PLT                      296                 03/23/2022              Anesthesia Other Findings Myositis on steroids  Reproductive/Obstetrics                             Anesthesia Physical Anesthesia Plan  ASA: 3  Anesthesia Plan: General   Post-op Pain Management: Minimal or no pain anticipated   Induction: Intravenous  PONV Risk Score and Plan: 2 and Ondansetron, Propofol infusion and TIVA  Airway Management Planned: Mask and LMA  Additional Equipment: None  Intra-op Plan:   Post-operative Plan: Extubation in OR  Informed Consent: I have reviewed the patients History and Physical, chart, labs and discussed the  procedure including the risks, benefits and  alternatives for the proposed anesthesia with the patient or authorized representative who has indicated his/her understanding and acceptance.     Dental advisory given  Plan Discussed with: CRNA  Anesthesia Plan Comments:        Anesthesia Quick Evaluation

## 2022-03-23 NOTE — Discharge Summary (Signed)
Physician Discharge Summary   Patient: Sean BeamsDavid B [name] MRN: [id number] DOB: [birth year]  Admit date:     03/18/2022  Discharge date: 03/24/22  Discharge Physician: Briant CedarNkeiruka J Kikuye Korenek   PCP: Sean [name], Aaron, MD   Recommendations at discharge:   Follow up with PCP in 1 week Follow up with Rheumatology Dr Sean [name] as discussed Follow up with Neurology Dr Sean [name], for nerve study as discussed   Discharge Diagnoses: Principal Problem:   Myositis Active Problems:   Abnormal liver enzymes   HTN (hypertension)   Coronary artery disease   Hypothyroidism   Elevated transaminase level    Hospital Course: 72 y.o.m w/ HTN HLD, and CAD with remote PCI who presents to the hospital as direct admission for expedited muscle biopsy and initiation of myositis treatment. Patient reports months of progressive generalized weakness with mild muscle aches, primarily involving the shoulders,thighs,and trunk. 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  He has extremity swelling associated with this but no fevers or rash.  He was evaluated by cardiology for his activity intolerance and had echocardiogram with normal EF and no diastolic dysfunction. He was started on Lasix for his swelling, but has not improved with this. He had elevated transaminases that were noted to be increasing, saw GI for this, and had CT of the abdomen and pelvis performed that was negative for any acute hepatobiliary finding. He has a history of alcoholism but reports just 0-1 drinks per day for the past several months. Seen at pulmonology and had workup notable for serum CK of 6777, aldolase of 83, and positive ANA with concern for acute myositis, direct admission to the hospital was arranged in order to expedite muscle biopsy and initiation of treatment.    Assessment and Plan:  Myositis Months of progressive  proximal weakness and myalgias Denies respiratory symptoms or dysphagia/speech issues Has a Hx of Rauynad's for many years, but recently gotten worse Extensive workup as outpatient notable for aldolase positive, CK elevated, ANA positive Myomarker 3+ profile/rheumatoid factor/CCP antibody/RNP antibody collected 1/31 Esr, crp normal, Antiscleroderma antibody negative, hypersensitivity pneumonitis negative A CK currently downtrending with steroids Previous attending discussed with Dr. Dimple [name] from outpatient rheumatology clinic on 2/1 reviewed his chart, recommended ambulatory referral for close follow-up  Neurology recommends high-dose steroids while inpatient, and discharge on 60 mg PO prednisone daily for 30 days, then decrease to 40 mg daily for another 30 days and then 20 mg thereafter Continue PPI upon d/c S/p muscle biopsy on 03/23/22 by general surgery, follow up with result Outpt follow up with Dr. Nita Sickleonika [name] from neurology for EMG and NCS   Weight loss Patient endorses about 20 pound weight loss since October will consult dietitian CT abdomen pelvis 12/27-no acute finding-no evidence of malignancy Outpatient follow-up to evaluate further etiology if weight does not improve   Elevated transaminases Saw GI as an outpatient, CT with no acute hepatobiliary findings as outpatient  Likely secondary to rhabdomyolysis/myositis +/- alcohol    CAD hx w/  remote BMS to LAD Edema of lower extremities and upper extremities: No chest pain. On Plavix and beta-blocker. At home on Lasix PRN Elevate BLE Bilateral doppler negative DVT   Frequent PACs Cont metoprolol    Hypothyroidism Continue Synthroid    Class I Obesity Patient's Body mass index is 31.51 kg/m.    {Tip this will not  be part of the note when signed Body mass index is 31.51 kg/m. ,  Nutrition Documentation    Flowsheet Row Admission (Current) from 03/18/2022 in [facility]  Nutrition Problem Unintentional  weight loss  Etiology --  [suspected inadequate intake to meet estimated needs]  Nutrition Goal Patient will meet greater than or equal to 90% of their needs  Interventions Refer to RD note for recommendations     ,  (Optional):26781}   Consultants: Neurology, General surgery Procedures performed: Muscle biopsy Disposition: Home Diet recommendation:  Cardiac diet   DISCHARGE MEDICATION: Allergies as of 03/23/2022   No Known Allergies      Medication List     STOP taking these medications    ibuprofen 200 MG tablet Commonly known as: ADVIL       TAKE these medications    clopidogrel 75 MG tablet Commonly known as: PLAVIX Take 1 tablet (75 mg total) by mouth daily.   furosemide 20 MG tablet Commonly known as: LASIX Take 1 tablet (20 mg total) by mouth as needed for fluid or edema. What changed: when to take this   HYDROcodone-acetaminophen 5-325 MG tablet Commonly known as: NORCO/VICODIN Take 1 tablet by mouth every 6 (six) hours as needed for up to 3 days for moderate pain.   levothyroxine 25 MCG tablet Commonly known as: SYNTHROID Take 25 mcg by mouth daily in the afternoon.   lisinopril 20 MG tablet Commonly known as: ZESTRIL Take 1 tablet (20 mg total) by mouth daily.   MELATONIN PO Take 1 tablet by mouth at bedtime as needed (sleep).   metoprolol succinate 50 MG 24 hr tablet Commonly known as: TOPROL-XL Take 1 tablet (50 mg total) by mouth every evening. Take with or immediately following a meal. What changed: additional instructions   multivitamin with minerals Tabs tablet Take 1 tablet by mouth daily.   nitroGLYCERIN 0.4 MG SL tablet Commonly known as: NITROSTAT Place 1 tablet (0.4 mg total) under the tongue every 5 (five) minutes as needed for chest pain.   pantoprazole 40 MG tablet Commonly known as: Protonix Take 1 tablet (40 mg total) by mouth daily.   potassium chloride 10 MEQ tablet Commonly known as: KLOR-CON M Take 1 tablet (10  mEq total) by mouth daily.   predniSONE 20 MG tablet Commonly known as: DELTASONE Take 3 tablets (60 mg total) by mouth daily with breakfast.   Repatha SureClick 140 MG/ML Soaj Generic drug: Evolocumab Inject 140 mg into the skin every 14 (fourteen) days.        Follow-up Information     Sean Has, MD Follow up in 1 week(s).   Specialty: Family Medicine Contact information: [address]  [address] [place] [address] [phone]         Fuller Plan, MD Follow up.   Specialty: Rheumatology Why: office will call for an appointment Contact information: [address] [place] [address] [phone]         Sean Sickle K, DO Follow up in 1 week(s).   Specialty: Neurology Why: Call to set up an appointment for the nerve study Contact information: [address] [place] [address] [phone]         Diamantina Monks, MD Follow up.   Specialty: Surgery Why: Office will call you with report from your muscle biopsy Contact information: [address] [facility] [place] [address] [phone]  Discharge Exam: Filed Weights   03/18/22 1800 03/20/22 0500 03/21/22 0426  Weight: 90.3 kg 87.6 kg 94 kg     Condition at discharge: stable  The results of significant diagnostics from this hospitalization (including imaging, microbiology, ancillary and laboratory) are listed below for reference.   Imaging Studies: VAS [place] LOWER EXTREMITY VENOUS (DVT)  Result Date: 03/23/2022  Lower Venous DVT Study Patient Name:  AUDY DAUPHINE  Date of Exam:   03/22/2022 Medical Rec #: [id number]     Accession #:    [id number] Date of Birth: 01-05-1951     Patient Gender: M Patient Age:   11 years Exam Location:  [facility] Procedure:      VAS [place] LOWER EXTREMITY VENOUS (DVT) Referring Phys: [facility] Jarryn Altland  --------------------------------------------------------------------------------  Indications: Swelling, and Myositis.  Comparison Study: No prior study on file Performing Technologist: Sharion Dove RVS  Examination Guidelines: A complete evaluation includes B-mode imaging, spectral Doppler, color Doppler, and power Doppler as needed of all accessible portions of each vessel. Bilateral testing is considered an integral part of a complete examination. Limited examinations for reoccurring indications may be performed as noted. The reflux portion of the exam is performed with the patient in reverse Trendelenburg.  +--------+---------------+---------+-----------+----------------+-------------+ RIGHT   CompressibilityPhasicitySpontaneityProperties      Thrombus                                                                 Aging         +--------+---------------+---------+-----------+----------------+-------------+ CFV     Full                               pulsatile                                                                waveforms                     +--------+---------------+---------+-----------+----------------+-------------+ SFJ     Full                                                             +--------+---------------+---------+-----------+----------------+-------------+ FV Prox Full                                                             +--------+---------------+---------+-----------+----------------+-------------+ FV Mid  Full                                                             +--------+---------------+---------+-----------+----------------+-------------+  FV      Full                                                             Distal                                                                   +--------+---------------+---------+-----------+----------------+-------------+ PFV     Full                                                              +--------+---------------+---------+-----------+----------------+-------------+ POP     Full                               pulsatile                                                                waveforms                     +--------+---------------+---------+-----------+----------------+-------------+ PTV     Full                                                             +--------+---------------+---------+-----------+----------------+-------------+ PERO    Full                                                             +--------+---------------+---------+-----------+----------------+-------------+   +--------+---------------+---------+-----------+----------------+-------------+ LEFT    CompressibilityPhasicitySpontaneityProperties      Thrombus                                                                 Aging         +--------+---------------+---------+-----------+----------------+-------------+ CFV     Full                               pulsatile  waveforms                     +--------+---------------+---------+-----------+----------------+-------------+ SFJ     Full                                                             +--------+---------------+---------+-----------+----------------+-------------+ FV Prox Full                                                             +--------+---------------+---------+-----------+----------------+-------------+ FV Mid  Full                                                             +--------+---------------+---------+-----------+----------------+-------------+ FV      Full                                                             Distal                                                                    +--------+---------------+---------+-----------+----------------+-------------+ PFV     Full                                                             +--------+---------------+---------+-----------+----------------+-------------+ POP     Full                               pulsatile                                                                waveforms                     +--------+---------------+---------+-----------+----------------+-------------+ PTV     Full                                                             +--------+---------------+---------+-----------+----------------+-------------+  PERO    Full                                                             +--------+---------------+---------+-----------+----------------+-------------+     Summary: BILATERAL: - No evidence of deep vein thrombosis seen in the lower extremities, bilaterally. -No evidence of popliteal cyst, bilaterally. RIGHT: pulsatile waveforms  LEFT: Pulsatile waveforms.  *See table(s) above for measurements and observations. Electronically signed by Lemar Livings MD on 03/23/2022 at 2:37:52 PM.    Final    MR FEMUR LEFT W WO CONTRAST  Result Date: 03/19/2022 CLINICAL DATA:  Weakness in the lower extremities with bile Timoteo Gaul, query myositis EXAM: MR OF THE LEFT LOWER EXTREMITY WITHOUT AND WITH CONTRAST TECHNIQUE: Multiplanar, multisequence MR imaging of the left femur/thigh was performed both before and after administration of intravenous contrast. CONTRAST:  60mL GADAVIST GADOBUTROL 1 MMOL/ML IV SOLN COMPARISON:  None Available. FINDINGS: Bones/Joint/Cartilage Degenerative arthropathy of the left hip joint, although assessment is limited due to boundary artifact. Similarly the knee is obscured by boundary artifact. No significant abnormal femoral intraosseous signal is observed. Ligaments N/A Muscles and Tendons Scattered regions of muscular edema in the thigh including the hip adductor musculature,  sartorius, semimembranosus, and long-head biceps femora S muscles. There is a lesser degree edema in the adductor magnus muscle and relative sparing of the gracilis, semitendinosus, and short head of the biceps femoris. On coronal images the contralateral lower extremity seems to have a relatively similar distribution of patchy edema on STIR images within the musculature, although with a greater degree of sparing of the adductor magnus. On post-contrast images the same regions of edema demonstrate subtle accentuated enhancement. No fatty infiltration of the muscles. No findings of abscess. Soft tissues Unremarkable IMPRESSION: 1. Scattered regions of muscular edema in the left thigh, primarily involving the adductor musculature, sartorius, semimembranosus, and long-head biceps femora, with relative sparing of the gracilis, semitendinosus, and short head of the biceps femoris. There is a lesser degree of edema in the adductor magnus muscle and relative sparing of the gracilis, semitendinosus, and short head of the biceps femoris. Similar findings appear to be present in the right thigh region on coronal images. The appearance is compatible with nonspecific myositis and differential diagnostic considerations include polymyositis, dermatomyositis, inclusion body myositis, connective tissue disease associated myositis, autoimmune neuropathy, or rhabdomyolysis. 2. Degenerative arthropathy of the left hip joint, although assessment is limited due to boundary artifact. Electronically Signed   By: Gaylyn Rong M.D.   On: 03/19/2022 18:27    Microbiology: Results for orders placed or performed during the hospital encounter of 03/18/22  Surgical PCR screen     Status: None   Collection Time: 03/19/22  7:37 PM   Specimen: Nasal Mucosa; Nasal Swab  Result Value Ref Range Status   MRSA, PCR NEGATIVE NEGATIVE Final   Staphylococcus aureus NEGATIVE NEGATIVE Final    Comment: (NOTE) The Xpert SA Assay (FDA approved  for NASAL specimens in patients 36 years of age and older), is one component of a comprehensive surveillance program. It is not intended to diagnose infection nor to guide or monitor treatment. Performed at [facility], [address] [place], [place] [address]     Labs: CBC: Recent Labs  Lab 03/18/22 2002 03/19/22 1660 03/20/22 6301  03/23/22 0346  WBC 7.3 5.9 6.0 9.0  NEUTROABS  --   --   --  7.6  HGB 14.1 13.6 14.8 13.4  HCT 43.4 41.3 47.1 40.9  MCV 93.9 93.4 94.0 93.6  PLT 320 277 337 296   Basic Metabolic Panel: Recent Labs  Lab 03/18/22 2002 03/19/22 0406 03/20/22 0609 03/23/22 0346  NA 139 138 135 135  K 3.8 3.4* 5.0 3.6  CL 102 102 102 104  CO2 28 27 24 23   GLUCOSE 99 97 176* 128*  BUN 37* 41* 43* 43*  CREATININE 1.07 1.01 1.00 0.86  CALCIUM 9.0 8.5* 8.9 8.3*  MG 2.0  --   --   --    Liver Function Tests: Recent Labs  Lab 03/18/22 2002 03/19/22 0406 03/20/22 0609 03/23/22 0346  AST 255* 212* 211* 135*  ALT 215* 175* 207* 258*  ALKPHOS 59 47 50 45  BILITOT 0.5 0.7 0.5 0.6  PROT 6.2* 5.6* 6.4* 5.4*  ALBUMIN 3.3* 2.8* 3.1* 2.8*   CBG: Recent Labs  Lab 03/18/22 1757  GLUCAP 98    Discharge time spent: greater than 30 minutes.  Signed: 03/20/22, MD [facility] 03/23/2022

## 2022-03-23 NOTE — Anesthesia Postprocedure Evaluation (Signed)
Anesthesia Post Note  Patient: Sean [name]  Procedure(s) Performed: LEFT THIGH MUSCLE BIOPSY (Left: Thigh)     Patient location during evaluation: PACU Anesthesia Type: MAC Level of consciousness: awake and alert Pain management: pain level controlled Vital Signs Assessment: post-procedure vital signs reviewed and stable Respiratory status: spontaneous breathing, nonlabored ventilation, respiratory function stable and patient connected to nasal cannula oxygen Cardiovascular status: stable and blood pressure returned to baseline Postop Assessment: no apparent nausea or vomiting Anesthetic complications: no   No notable events documented.  Last Vitals:  Vitals:   03/23/22 1631 03/23/22 1915  BP: 130/79 131/84  Pulse: 72 82  Resp: 18 18  Temp: (!) 36.4 C (!) 36.4 C  SpO2: 94% 95%    Last Pain:  Vitals:   03/23/22 1915  TempSrc: Oral  PainSc: 0-No pain                 Kolleen Ochsner

## 2022-03-23 NOTE — Care Management Important Message (Signed)
Important Message  Patient Details  Name: Sean [name] MRN: [id number] Date of Birth: 06-Oct-1950   Medicare Important Message Given:  Yes     Orbie Pyo 03/23/2022, 2:23 PM

## 2022-03-24 ENCOUNTER — Encounter (HOSPITAL_COMMUNITY): Payer: Self-pay | Admitting: Surgery

## 2022-03-24 NOTE — Progress Notes (Signed)
[facility] Progress Note  1 Day Post-Op  Subjective: CC-  Up in chair. Not taking any pain medication. Ready to go home.  Objective: Vital signs in last 24 hours: Temp:  [97.5 F (36.4 C)-98.3 F (36.8 C)] 97.9 F (36.6 C) (02/06 0737) Pulse Rate:  [72-82] 77 (02/06 0737) Resp:  [12-18] 17 (02/06 0737) BP: (126-141)/(75-94) 141/77 (02/06 0737) SpO2:  [94 %-98 %] 97 % (02/06 0737) Last BM Date : 03/21/22  Intake/Output from previous day: 02/05 0701 - 02/06 0700 In: 400 [I.V.:400] Out: 0  Intake/Output this shift: No intake/output data recorded.  PE: Gen:  Alert, NAD Skin: left thigh incision cdi with dermabond present and no erythema or drainage  Lab Results:  Recent Labs    03/23/22 0346  WBC 9.0  HGB 13.4  HCT 40.9  PLT 296   BMET Recent Labs    03/23/22 0346  NA 135  K 3.6  CL 104  CO2 23  GLUCOSE 128*  BUN 43*  CREATININE 0.86  CALCIUM 8.3*   PT/INR No results for input(s): "LABPROT", "INR" in the last 72 hours. CMP     Component Value Date/Time   NA 135 03/23/2022 0346   NA 139 12/23/2021 0840   K 3.6 03/23/2022 0346   CL 104 03/23/2022 0346   CO2 23 03/23/2022 0346   GLUCOSE 128 (H) 03/23/2022 0346   BUN 43 (H) 03/23/2022 0346   BUN 28 (H) 12/23/2021 0840   CREATININE 0.86 03/23/2022 0346   CALCIUM 8.3 (L) 03/23/2022 0346   PROT 5.4 (L) 03/23/2022 0346   PROT 6.3 01/27/2022 1005   ALBUMIN 2.8 (L) 03/23/2022 0346   ALBUMIN 3.7 (L) 01/27/2022 1005   AST 135 (H) 03/23/2022 0346   ALT 258 (H) 03/23/2022 0346   ALKPHOS 45 03/23/2022 0346   BILITOT 0.6 03/23/2022 0346   BILITOT 0.3 01/27/2022 1005   GFRNONAA >60 03/23/2022 0346   GFRAA >90 05/04/2011 1312   Lipase  No results found for: "LIPASE"     Studies/Results: VAS [place] LOWER EXTREMITY VENOUS (DVT)  Result Date: 03/23/2022  Lower Venous DVT Study Patient Name:  Sean [name]  Date of Exam:   03/22/2022 Medical Rec #: [id number]     Accession #:    [id number] Date of  Birth: 01/11/1951     Patient Gender: M Patient Age:   72 years Exam Location:  [facility] Procedure:      VAS [place] LOWER EXTREMITY VENOUS (DVT) Referring Phys: [facility] EZEND[facility] --------------------------------------------------------------------------------  Indications: Swelling, and Myositis.  Comparison Study: No prior study on file Performing Technologist: Sharion Dove RVS  Examination Guidelines: A complete evaluation includes B-mode imaging, spectral Doppler, color Doppler, and power Doppler as needed of all accessible portions of each vessel. Bilateral testing is considered an integral part of a complete examination. Limited examinations for reoccurring indications may be performed as noted. The reflux portion of the exam is performed with the patient in reverse Trendelenburg.  +--------+---------------+---------+-----------+----------------+-------------+ RIGHT   CompressibilityPhasicitySpontaneityProperties      Thrombus                                                                 Aging         +--------+---------------+---------+-----------+----------------+-------------+ CFV  Full                               pulsatile                                                                waveforms                     +--------+---------------+---------+-----------+----------------+-------------+ SFJ     Full                                                             +--------+---------------+---------+-----------+----------------+-------------+ FV Prox Full                                                             +--------+---------------+---------+-----------+----------------+-------------+ FV Mid  Full                                                             +--------+---------------+---------+-----------+----------------+-------------+ FV      Full                                                             Distal                                                                    +--------+---------------+---------+-----------+----------------+-------------+ PFV     Full                                                             +--------+---------------+---------+-----------+----------------+-------------+ POP     Full                               pulsatile  waveforms                     +--------+---------------+---------+-----------+----------------+-------------+ PTV     Full                                                             +--------+---------------+---------+-----------+----------------+-------------+ PERO    Full                                                             +--------+---------------+---------+-----------+----------------+-------------+   +--------+---------------+---------+-----------+----------------+-------------+ LEFT    CompressibilityPhasicitySpontaneityProperties      Thrombus                                                                 Aging         +--------+---------------+---------+-----------+----------------+-------------+ CFV     Full                               pulsatile                                                                waveforms                     +--------+---------------+---------+-----------+----------------+-------------+ SFJ     Full                                                             +--------+---------------+---------+-----------+----------------+-------------+ FV Prox Full                                                             +--------+---------------+---------+-----------+----------------+-------------+ FV Mid  Full                                                             +--------+---------------+---------+-----------+----------------+-------------+ FV      Full  Distal                                                                   +--------+---------------+---------+-----------+----------------+-------------+ PFV     Full                                                             +--------+---------------+---------+-----------+----------------+-------------+ POP     Full                               pulsatile                                                                waveforms                     +--------+---------------+---------+-----------+----------------+-------------+ PTV     Full                                                             +--------+---------------+---------+-----------+----------------+-------------+ PERO    Full                                                             +--------+---------------+---------+-----------+----------------+-------------+     Summary: BILATERAL: - No evidence of deep vein thrombosis seen in the lower extremities, bilaterally. -No evidence of popliteal cyst, bilaterally. RIGHT: pulsatile waveforms  LEFT: Pulsatile waveforms.  *See table(s) above for measurements and observations. Electronically signed by Lemar Livings MD on 03/23/2022 at 2:37:52 PM.    Final     Anti-infectives: Anti-infectives (From admission, onward)    Start     Dose/Rate Route Frequency Ordered Stop   03/23/22 0600  ceFAZolin (ANCEF) IVPB 2g/100 mL premix        2 g 200 mL/hr over 30 Minutes Intravenous On call to O.R. 03/20/22 0931 03/23/22 1501   03/20/22 0600  ceFAZolin (ANCEF) IVPB 2g/100 mL premix  Status:  Discontinued        2 g 200 mL/hr over 30 Minutes Intravenous On call to O.R. 03/19/22 1129 03/20/22 0931        Assessment/Plan POD#1 s/p left thigh muscle biopsy - Wound cdi. Ok for discharge from surgical standpoint. Ok to resume plavix. Follow up [facility] PRN.    LOS: 6 days    Franne Forts, [facility] Surgery 03/24/2022, 9:29  AM Please see Amion for pager number during day hours 7:00am-4:30pm

## 2022-03-25 NOTE — Progress Notes (Unsigned)
Office Visit Note  Patient: Sean [name]             Date of Birth: Dec 22, 1950           MRN: [id number]             PCP: [name], MD Referring: Lanae Boast, MD Visit Date: 03/26/2022 Occupation: Midwife, retired Art gallery manager  Subjective:  Establish Care   History of Present Illness: Sean [name] is a 72 y.o. male here for myositis after recent hospital discharge.  He has experienced progressive weakening in proximal muscles as well as increased peripheral edema ongoing for months leading up to this evaluation.  Increase in his swelling and some dyspnea on exertion prompting cardiology evaluation in October and due to abnormal liver function test he was discontinued off of rosuvastatin at that time.  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  Over the next few months until January he experienced ongoing unintentional weight loss down about 20 pounds and worsening exertion intolerance difficulty with mobility and noticed progressive hunching over of his shoulders with height loss.  He saw gastroenterology due to the unintentional weight loss and abnormal LFTs with CT scan of the abdomen and pelvis with contrast was entirely normal besides prostate hypertrophy.  He eventually had updated echocardiogram and cardiac PET CT scan for evaluation showing normal function no abnormal metabolic uptake in the associated chest CT was negative for abnormal findings.  He saw Dr. Lenise Herald in pulmonology clinic due to his dyspnea despite normal cardiac workup where he was noted to have some myalgias and proximal weakness as well as apparent Raynaud's symptoms with pallor in several digits prompted concern for connective tissue disease.  Laboratory testing showed highly elevated CK 6777 and aldolase 83.1 and highly positive ANA antibody titer 1:1280.  He was admitted to the hospital and started  on high-dose steroids to expedite muscle biopsy with sampling from the left vastus lateralis collected on February 5. He is currently taking prednisone 60 mg daily with plan to taper down by 20 mg/day dose each month and also has plans to follow-up with Dr. Allena Katz for EMG study.  Remains quite weak compared to his baseline that he is able to walk without assistance just only limited to short distances.  He is noticing some dyspnea and nonproductive cough especially at nighttime when lying flat.  Denies any significant dysphagia.  He is not seeing any of the ongoing discoloration occurring in his fingers and toes.  No new skin rashes.   Activities of Daily Living:  Patient reports morning stiffness for constant. .   Patient Reports nocturnal pain.  Difficulty dressing/grooming: Reports Difficulty climbing stairs: Reports Difficulty getting out of chair: Reports Difficulty using hands for taps, buttons, cutlery, and/or writing: Reports  Review of Systems  Constitutional:  Positive for fatigue.  HENT: Negative.  Negative for mouth sores and mouth dryness.   Eyes: Negative.  Negative for dryness.  Respiratory: Negative.  Negative for shortness of breath.   Cardiovascular: Negative.  Negative for chest pain and palpitations.  Gastrointestinal: Negative.  Negative for blood in stool, constipation and diarrhea.  Endocrine: Negative.  Negative for increased urination.  Genitourinary: Negative.  Negative for involuntary urination.  Musculoskeletal:  Positive for gait problem and morning stiffness. Negative for joint pain, joint pain, joint swelling, myalgias, muscle weakness, muscle tenderness and myalgias.  Skin: Negative.  Negative for pallor, rash, hair loss and sensitivity to sunlight.  Allergic/Immunologic: Negative.  Negative for susceptible to infections.  Neurological:  Negative for dizziness and headaches.  Hematological: Negative.  Negative for swollen glands.  Psychiatric/Behavioral:   Positive for sleep disturbance. Negative for depressed mood. The patient is nervous/anxious.     PMFS History:  Patient Active Problem List   Diagnosis Date Noted   Long term (current) use of systemic steroids 03/26/2022   Pedal edema 03/26/2022   Myositis 03/18/2022   Alcohol addiction (HCC) 03/18/2022   Hypothyroidism 03/18/2022   Elevated transaminase level 03/18/2022   RBBB 10/24/2015   Coronary artery disease    Tachycardia 05/09/2011   Abnormal liver enzymes 05/06/2011   HTN (hypertension) 04/29/2011   HLD (hyperlipidemia) 04/29/2011    Past Medical History:  Diagnosis Date   Alcohol addiction (HCC)    2010- admitted for withdrawal , detox admission after that   Arrhythmia    Coronary artery disease    95% mid LAD s/p BMS of LAD with aneurysmal segment   Depression    ED (erectile dysfunction)    HLD (hyperlipidemia)    HTN (hypertension)    Mental disorder    Peptic ulcer    RBBB 10/24/2015    Family History  Problem Relation Age of Onset   Prostate cancer Father    Heart attack Brother 85   Anesthesia problems Neg Hx    Hypotension Neg Hx    Malignant hyperthermia Neg Hx    Pseudochol deficiency Neg Hx    Colon cancer Neg Hx    Stomach cancer Neg Hx    Esophageal cancer Neg Hx    Colon polyps Neg Hx    Past Surgical History:  Procedure Laterality Date   CORONARY STENT PLACEMENT     MUSCLE BIOPSY Left 03/23/2022   Procedure: LEFT THIGH MUSCLE BIOPSY;  Surgeon: Diamantina Monks, MD;  Location: [facility] OR;  Service: General;  Laterality: Left;   Social History   Social History Narrative   Live with wife in [place], is an Art gallery manager, currently driving trucks for a local firm. Quit smoking 1990 after smoking for 20 years. Drinks heavilyu (1 bottle of vodka or 18-24 beers a day). Used to smoke marijuana but quit in 1990s. Has united healthcare insurance. Goes to [employer] for alcohol cessation   Immunization History  Administered Date(s) Administered    PFIZER(Purple Top)SARS-COV-2 Vaccination 04/01/2019     Objective: Vital Signs: BP 101/63 (BP Location: Right Arm, Patient Position: Sitting, Cuff Size: Normal)   Pulse 76   Resp 16   Ht 5' 9.5" (1.765 m)   Wt 200 lb (90.7 kg)   BMI 29.11 kg/m    Physical Exam HENT:     Mouth/Throat:     Mouth: Mucous membranes are moist.     Pharynx: Oropharynx is clear.  Eyes:     Conjunctiva/sclera: Conjunctivae normal.  Cardiovascular:     Rate and Rhythm: Normal rate and regular rhythm.  Pulmonary:     Effort: Pulmonary effort is normal.     Comments: Basilar inspiratory crackles Musculoskeletal:     Comments: 2+ pitting edema in both legs extending most of the way towards knees  Lymphadenopathy:     Cervical: No cervical adenopathy.  Skin:    Findings: No rash.  Neurological:     Mental Status: He is alert.     Motor: Weakness present.     Comments: Shoulder abduction and elbow flexion/extension  4/5 Hip flexion 2/5 Knee extension/flexion 4/5  Psychiatric:        Mood and Affect: Mood normal.      Musculoskeletal Exam:  Shoulders full ROM no tenderness or swelling Elbows full ROM no tenderness or swelling Wrists full ROM no tenderness or swelling Fingers full ROM no tenderness or swelling Knees full ROM no tenderness or swelling Ankles full ROM no tenderness or swelling  Investigation: No additional findings.  Imaging: VAS [place] LOWER EXTREMITY VENOUS (DVT)  Result Date: 03/23/2022  Lower Venous DVT Study Patient Name:  Sean [name]  Date of Exam:   03/22/2022 Medical Rec #: [id number]     Accession #:    [id number] Date of Birth: 05-Jun-1950     Patient Gender: M Patient Age:   32 years Exam Location:  [facility] Procedure:      VAS [place] LOWER EXTREMITY VENOUS (DVT) Referring Phys: [facility] EZEND[facility]A --------------------------------------------------------------------------------  Indications: Swelling, and Myositis.  Comparison Study: No prior study on file Performing  Technologist: Sherren Kerns RVS  Examination Guidelines: A complete evaluation includes B-mode imaging, spectral Doppler, color Doppler, and power Doppler as needed of all accessible portions of each vessel. Bilateral testing is considered an integral part of a complete examination. Limited examinations for reoccurring indications may be performed as noted. The reflux portion of the exam is performed with the patient in reverse Trendelenburg.  +--------+---------------+---------+-----------+----------------+-------------+ RIGHT   CompressibilityPhasicitySpontaneityProperties      Thrombus                                                                 Aging         +--------+---------------+---------+-----------+----------------+-------------+ CFV     Full                               pulsatile                                                                waveforms                     +--------+---------------+---------+-----------+----------------+-------------+ SFJ     Full                                                             +--------+---------------+---------+-----------+----------------+-------------+ FV Prox Full                                                             +--------+---------------+---------+-----------+----------------+-------------+ FV Mid  Full                                                             +--------+---------------+---------+-----------+----------------+-------------+  FV      Full                                                             Distal                                                                   +--------+---------------+---------+-----------+----------------+-------------+ PFV     Full                                                             +--------+---------------+---------+-----------+----------------+-------------+ POP     Full                               pulsatile                                                                 waveforms                     +--------+---------------+---------+-----------+----------------+-------------+ PTV     Full                                                             +--------+---------------+---------+-----------+----------------+-------------+ PERO    Full                                                             +--------+---------------+---------+-----------+----------------+-------------+   +--------+---------------+---------+-----------+----------------+-------------+ LEFT    CompressibilityPhasicitySpontaneityProperties      Thrombus                                                                 Aging         +--------+---------------+---------+-----------+----------------+-------------+ CFV     Full                               pulsatile  waveforms                     +--------+---------------+---------+-----------+----------------+-------------+ SFJ     Full                                                             +--------+---------------+---------+-----------+----------------+-------------+ FV Prox Full                                                             +--------+---------------+---------+-----------+----------------+-------------+ FV Mid  Full                                                             +--------+---------------+---------+-----------+----------------+-------------+ FV      Full                                                             Distal                                                                   +--------+---------------+---------+-----------+----------------+-------------+ PFV     Full                                                             +--------+---------------+---------+-----------+----------------+-------------+ POP      Full                               pulsatile                                                                waveforms                     +--------+---------------+---------+-----------+----------------+-------------+ PTV     Full                                                             +--------+---------------+---------+-----------+----------------+-------------+  PERO    Full                                                             +--------+---------------+---------+-----------+----------------+-------------+     Summary: BILATERAL: - No evidence of deep vein thrombosis seen in the lower extremities, bilaterally. -No evidence of popliteal cyst, bilaterally. RIGHT: pulsatile waveforms  LEFT: Pulsatile waveforms.  *See table(s) above for measurements and observations. Electronically signed by Servando Snare MD on 03/23/2022 at 2:37:52 PM.    Final    MR FEMUR LEFT W WO CONTRAST  Result Date: 03/19/2022 CLINICAL DATA:  Weakness in the lower extremities with bile Oralia Rud, query myositis EXAM: MR OF THE LEFT LOWER EXTREMITY WITHOUT AND WITH CONTRAST TECHNIQUE: Multiplanar, multisequence MR imaging of the left femur/thigh was performed both before and after administration of intravenous contrast. CONTRAST:  32mL GADAVIST GADOBUTROL 1 MMOL/ML IV SOLN COMPARISON:  None Available. FINDINGS: Bones/Joint/Cartilage Degenerative arthropathy of the left hip joint, although assessment is limited due to boundary artifact. Similarly the knee is obscured by boundary artifact. No significant abnormal femoral intraosseous signal is observed. Ligaments N/A Muscles and Tendons Scattered regions of muscular edema in the thigh including the hip adductor musculature, sartorius, semimembranosus, and long-head biceps femora S muscles. There is a lesser degree edema in the adductor magnus muscle and relative sparing of the gracilis, semitendinosus, and short head of the biceps femoris. On coronal images the  contralateral lower extremity seems to have a relatively similar distribution of patchy edema on STIR images within the musculature, although with a greater degree of sparing of the adductor magnus. On post-contrast images the same regions of edema demonstrate subtle accentuated enhancement. No fatty infiltration of the muscles. No findings of abscess. Soft tissues Unremarkable IMPRESSION: 1. Scattered regions of muscular edema in the left thigh, primarily involving the adductor musculature, sartorius, semimembranosus, and long-head biceps femora, with relative sparing of the gracilis, semitendinosus, and short head of the biceps femoris. There is a lesser degree of edema in the adductor magnus muscle and relative sparing of the gracilis, semitendinosus, and short head of the biceps femoris. Similar findings appear to be present in the right thigh region on coronal images. The appearance is compatible with nonspecific myositis and differential diagnostic considerations include polymyositis, dermatomyositis, inclusion body myositis, connective tissue disease associated myositis, autoimmune neuropathy, or rhabdomyolysis. 2. Degenerative arthropathy of the left hip joint, although assessment is limited due to boundary artifact. Electronically Signed   By: Van Clines M.D.   On: 03/19/2022 18:27    Recent Labs: Lab Results  Component Value Date   WBC 9.0 03/23/2022   HGB 13.4 03/23/2022   PLT 296 03/23/2022   NA 135 03/23/2022   K 3.6 03/23/2022   CL 104 03/23/2022   CO2 23 03/23/2022   GLUCOSE 128 (H) 03/23/2022   BUN 43 (H) 03/23/2022   CREATININE 0.86 03/23/2022   BILITOT 0.6 03/23/2022   ALKPHOS 45 03/23/2022   AST 135 (H) 03/23/2022   ALT 258 (H) 03/23/2022   PROT 5.4 (L) 03/23/2022   ALBUMIN 2.8 (L) 03/23/2022   CALCIUM 8.3 (L) 03/23/2022   GFRAA >90 05/04/2011    Speciality Comments: No specialty comments available.  Procedures:  No procedures performed Allergies: Patient has  no known allergies.   Assessment /  Plan:     Visit Diagnoses: Other myositis of multiple sites - Plan: Anti-HMGCR Ab IgG, Thiopurine methyltransferase(tpmt)rbc  Clinical course and presentation appears consistent for an inflammatory myositis.  I do not see any specific other pattern indicating underlying connective tissue disease.  He was on previous statin therapy prior to onset of symptoms though this has been discontinued for at least 4 months.  I cannot see where anti-HMG CoA reductase antibody was definitely obtained so we will order this today.  MyoMarker 3 antibody panel appears to be pending I do not see any result documented at this time.  Muscle biopsy is also pending which will be useful for informing any additional screening needed or more disease specific treatment.Will also check TPMT enzyme activity he already had negative hepatitis screening. During his workup he had CT imaging of the chest abdomen and pelvis all of which were unremarkable including pet imaging of the heart.  No abnormal elevations in total proteins is low as would be expected with decreased serum albumin.  No obvious indication suggestive for underlying malignancy as a cause. Agree with current high-dose prednisone tapering.  He states he has upcoming follow-up with Dr. Orland Mustard who will refer him to physical therapy which I stressed is very important for limiting loss of functionality and speeding up strength recovery.  Abnormal liver enzymes  Appears to be secondary to skeletal muscle source with the extremely high CK normal appearance of liver on recent CT abdomen and no other findings with recent GI workup.  Long term (current) use of systemic steroids - Plan: sulfamethoxazole-trimethoprim (BACTRIM) 400-80 MG tablet  Discussed risks to look out for with prolonged high-dose prednisone treatment.  He is taking pantoprazole 40 mg daily for prophylaxis.  Prescribed Bactrim 3 times weekly for antibiotic  prophylaxis.  Pedal edema  Not completely certain regarding his increasing edema function looks normal based on extensive evaluation in December.  May be largely related to his loss of muscle bulk and mobility and hypoalbuminemia.  Discussed trying to be compliant as possible with his Lasix.  Recommended inclination of head of the bed using more pillows I did if suspicious he is having some mild pulmonary symptoms related to volume overload.  Orders: Orders Placed This Encounter  Procedures   Anti-HMGCR Ab IgG   Thiopurine methyltransferase(tpmt)rbc   Meds ordered this encounter  Medications   sulfamethoxazole-trimethoprim (BACTRIM) 400-80 MG tablet    Sig: Take 1 tablet by mouth 3 (three) times a week.    Dispense:  30 tablet    Refill:  0     Follow-Up Instructions: Return in about 4 weeks (around 04/23/2022) for New pt myositis on GC f/u 79mo.   Collier Salina, MD  Note - This record has been created using [employer].  Chart creation errors have been sought, but may not always  have been located. Such creation errors do not reflect on  the standard of medical care.

## 2022-03-26 ENCOUNTER — Encounter: Payer: Self-pay | Admitting: Internal Medicine

## 2022-03-26 ENCOUNTER — Ambulatory Visit: Payer: PPO | Attending: Internal Medicine | Admitting: Internal Medicine

## 2022-03-26 VITALS — BP 101/63 | HR 76 | Resp 16 | Ht 69.5 in | Wt 200.0 lb

## 2022-03-26 DIAGNOSIS — R600 Localized edema: Secondary | ICD-10-CM | POA: Diagnosis not present

## 2022-03-26 DIAGNOSIS — Z7952 Long term (current) use of systemic steroids: Secondary | ICD-10-CM | POA: Diagnosis not present

## 2022-03-26 DIAGNOSIS — R748 Abnormal levels of other serum enzymes: Secondary | ICD-10-CM

## 2022-03-26 DIAGNOSIS — M6089 Other myositis, multiple sites: Secondary | ICD-10-CM | POA: Diagnosis not present

## 2022-03-26 MED ORDER — SULFAMETHOXAZOLE-TRIMETHOPRIM 400-80 MG PO TABS: 1.0000 | ORAL_TABLET | ORAL | 0 refills | Status: DC

## 2022-03-27 DIAGNOSIS — W19XXXA Unspecified fall, initial encounter: Secondary | ICD-10-CM | POA: Diagnosis not present

## 2022-03-27 DIAGNOSIS — M609 Myositis, unspecified: Secondary | ICD-10-CM | POA: Diagnosis not present

## 2022-03-27 DIAGNOSIS — Z9181 History of falling: Secondary | ICD-10-CM | POA: Diagnosis not present

## 2022-03-27 DIAGNOSIS — Z09 Encounter for follow-up examination after completed treatment for conditions other than malignant neoplasm: Secondary | ICD-10-CM | POA: Diagnosis not present

## 2022-03-27 DIAGNOSIS — I10 Essential (primary) hypertension: Secondary | ICD-10-CM | POA: Diagnosis not present

## 2022-03-31 ENCOUNTER — Ambulatory Visit: Payer: PPO | Attending: Pulmonary Disease

## 2022-03-31 DIAGNOSIS — M609 Myositis, unspecified: Secondary | ICD-10-CM | POA: Diagnosis not present

## 2022-03-31 DIAGNOSIS — Z79899 Other long term (current) drug therapy: Secondary | ICD-10-CM | POA: Diagnosis not present

## 2022-03-31 DIAGNOSIS — Z8711 Personal history of peptic ulcer disease: Secondary | ICD-10-CM | POA: Diagnosis not present

## 2022-03-31 DIAGNOSIS — Z7952 Long term (current) use of systemic steroids: Secondary | ICD-10-CM | POA: Diagnosis not present

## 2022-03-31 DIAGNOSIS — E785 Hyperlipidemia, unspecified: Secondary | ICD-10-CM

## 2022-03-31 DIAGNOSIS — Z7902 Long term (current) use of antithrombotics/antiplatelets: Secondary | ICD-10-CM | POA: Diagnosis not present

## 2022-03-31 DIAGNOSIS — Z9181 History of falling: Secondary | ICD-10-CM | POA: Diagnosis not present

## 2022-03-31 DIAGNOSIS — E039 Hypothyroidism, unspecified: Secondary | ICD-10-CM | POA: Diagnosis not present

## 2022-03-31 DIAGNOSIS — Z87891 Personal history of nicotine dependence: Secondary | ICD-10-CM | POA: Diagnosis not present

## 2022-03-31 DIAGNOSIS — I2510 Atherosclerotic heart disease of native coronary artery without angina pectoris: Secondary | ICD-10-CM | POA: Diagnosis not present

## 2022-03-31 DIAGNOSIS — I10 Essential (primary) hypertension: Secondary | ICD-10-CM | POA: Diagnosis not present

## 2022-04-01 LAB — APOLIPOPROTEIN B: Apolipoprotein B: 55 mg/dL (ref <90)

## 2022-04-01 LAB — LIPID PANEL
Chol/HDL Ratio: 2.4 ratio (ref 0.0–5.0)
Cholesterol, Total: 104 mg/dL (ref 100–199)
HDL: 44 mg/dL (ref >39)
LDL Chol Calc (NIH): 33 mg/dL (ref 0–99)
Triglycerides: 161 mg/dL — ABNORMAL HIGH (ref 0–149)
VLDL Cholesterol Cal: 27 mg/dL (ref 5–40)

## 2022-04-02 ENCOUNTER — Encounter: Payer: Self-pay | Admitting: Neurology

## 2022-04-02 ENCOUNTER — Other Ambulatory Visit: Payer: Self-pay

## 2022-04-02 DIAGNOSIS — R202 Paresthesia of skin: Secondary | ICD-10-CM

## 2022-04-03 DIAGNOSIS — I2510 Atherosclerotic heart disease of native coronary artery without angina pectoris: Secondary | ICD-10-CM | POA: Diagnosis not present

## 2022-04-03 DIAGNOSIS — E785 Hyperlipidemia, unspecified: Secondary | ICD-10-CM | POA: Diagnosis not present

## 2022-04-03 DIAGNOSIS — I25118 Atherosclerotic heart disease of native coronary artery with other forms of angina pectoris: Secondary | ICD-10-CM | POA: Diagnosis not present

## 2022-04-03 DIAGNOSIS — I10 Essential (primary) hypertension: Secondary | ICD-10-CM | POA: Diagnosis not present

## 2022-04-03 DIAGNOSIS — Z0000 Encounter for general adult medical examination without abnormal findings: Secondary | ICD-10-CM | POA: Diagnosis not present

## 2022-04-03 DIAGNOSIS — M609 Myositis, unspecified: Secondary | ICD-10-CM | POA: Diagnosis not present

## 2022-04-03 DIAGNOSIS — Z125 Encounter for screening for malignant neoplasm of prostate: Secondary | ICD-10-CM | POA: Diagnosis not present

## 2022-04-03 DIAGNOSIS — E039 Hypothyroidism, unspecified: Secondary | ICD-10-CM | POA: Diagnosis not present

## 2022-04-04 ENCOUNTER — Other Ambulatory Visit: Payer: PPO

## 2022-04-04 LAB — LAB REPORT - SCANNED: EGFR: 97

## 2022-04-06 LAB — MYOMARKER 3 PLUS PROFILE (RDL)
Anti-EJ Ab (RDL): NEGATIVE
Anti-Jo-1 Ab (RDL): <20 Units (ref <20)
Anti-Ku Ab (RDL): NEGATIVE
Anti-MDA-5 Ab (CADM-140)(RDL): <20 Units (ref <20)
Anti-Mi-2 Ab (RDL): NEGATIVE
Anti-NXP-2 (P140) Ab (RDL): <20 Units (ref <20)
Anti-OJ Ab (RDL): NEGATIVE
Anti-PL-12 Ab (RDL: NEGATIVE
Anti-PL-7 Ab (RDL): NEGATIVE
Anti-PM/Scl-100 Ab (RDL): 22 Units — ABNORMAL HIGH (ref <20)
Anti-SAE1 Ab, IgG (RDL): <20 Units (ref <20)
Anti-SRP Ab (RDL): NEGATIVE
Anti-SS-A 52kD Ab, IgG (RDL): 90 Units — ABNORMAL HIGH (ref <20)
Anti-TIF-1gamma Ab (RDL): 23 Units — ABNORMAL HIGH (ref <20)
Anti-U1 RNP Ab (RDL): <20 Units (ref <20)
Anti-U2 RNP Ab (RDL): NEGATIVE
Anti-U3 RNP (Fibrillarin)(RDL): NEGATIVE

## 2022-04-06 NOTE — Progress Notes (Signed)
$@PatientJ$  ID: Sean [name], male    DOB: 1950/08/14, 72 y.o.   MRN: [id number]  Chief Complaint  Patient presents with   Follow-up    Breathing is ok     Referring provider: [name], MD  HPI: 72 year old male. PMH significant for HTN, CAD, RBBB, abnormal liver enzymes, alcohol addiction, HLD.   Previous [facility] pulmonary encounters: 03/18/22- Dr. Valeta Harms  This is a 72 year old gentleman, history of alcohol abuse, coronary artery disease, depression, hypertension, hyperlipidemia.  Patient is a former smoker quit in 19 90, 34-pack-year history. He trys to walk a lot. He has dogs that he walks regularly. Over the past 4 months he has had increased sob and fatigue. Distances and inclines make walking worse. He saw cardiology and they did several studies and everything seemed fine. He was sent to GI for evaluation for elevated LFTs, still drinking alcohol daily.  He feels like he is becoming weaker and weaker over the past couple of months.  He is finding that he is having pains in his muscles and when he describes this he points to locations in large muscle groups and side of the thigh as well as shoulders.  He is also noticed ongoing weight loss.  He also has recurrent episodes of Raynaud's of the hands.  OV 03/18/2022: Here today for evaluation of recent abnormal labs.  His CK is elevated, aldolase elevated, ANA titer was positive.  For some reason his myositis antibody panel was canceled.  04/08/2022 Patient was seen last month for shortness of breath/dyspnea symptoms. He was dx with inflammatory myositis recently. Currently on prednisone 40m and following with rheumatology.   No issues with shortness of breath. Congestion is a lot better since being discharged from the hospital. His main complaint is muscle fatigue. He first noticed weakness last spring. He has no muscle pain except for chronic lower back pain. He has seen some improvement in his functioning since starting steroids. He is able  to do a little bit more than he could last week. He is getting physical therapy. After therapy he was tired for the rest of the day.   He also has generalized fluid retention that started before he began taking high dose steroids, he increased lasix 444mfor a couple of days. He has cut back on alcohol but still having 2-3 drinks every couple of days. Following with Dr. PaPosey Prontoith neurology , planning for EMG and NCS.    No Known Allergies  Immunization History  Administered Date(s) Administered   Fluad Quad(high Dose 65+) 10/20/2021   PFIZER(Purple Top)SARS-COV-2 Vaccination 04/01/2019    Past Medical History:  Diagnosis Date   [place]   [place]- admitted for withdrawal , detox admission after that   Arrhythmia    Coronary artery disease    95% mid LAD s/p BMS of LAD with aneurysmal segment   Depression    ED (erectile dysfunction)    HLD (hyperlipidemia)    HTN (hypertension)    Mental disorder    Peptic ulcer    RBBB 10/24/2015    Tobacco History: Social History   Tobacco Use  Smoking Status Former   Types: Cigarettes   Quit date: 1990   Years since quitting: 34.1  Smokeless Tobacco Never   Counseling given: Not Answered   Outpatient Medications Prior to Visit  Medication Sig Dispense Refill   clopidogrel (PLAVIX) 75 MG tablet Take 1 tablet (75 mg total) by mouth daily. 90 tablet 3  Evolocumab (REPATHA SURECLICK) [id number] MG/ML SOAJ Inject 140 mg into the skin every 14 (fourteen) days. 2 mL 11   furosemide (LASIX) 20 MG tablet Take 1 tablet (20 mg total) by mouth as needed for fluid or edema. (Patient taking differently: Take 20 mg by mouth daily.) 90 tablet 3   levothyroxine (SYNTHROID) 25 MCG tablet Take 25 mcg by mouth daily in the afternoon.     lisinopril (ZESTRIL) 20 MG tablet Take 1 tablet (20 mg total) by mouth daily. 90 tablet 3   MELATONIN PO Take 1 tablet by mouth at bedtime as needed (sleep).     metoprolol succinate (TOPROL-XL) 50 MG 24 hr  tablet Take 1 tablet (50 mg total) by mouth every evening. Take with or immediately following a meal. (Patient taking differently: Take 50 mg by mouth every evening.) 90 tablet 3   Multiple Vitamin (MULITIVITAMIN WITH MINERALS) TABS Take 1 tablet by mouth daily.     nitroGLYCERIN (NITROSTAT) 0.4 MG SL tablet Place 1 tablet (0.4 mg total) under the tongue every 5 (five) minutes as needed for chest pain. 25 tablet 3   pantoprazole (PROTONIX) 40 MG tablet Take 1 tablet (40 mg total) by mouth daily. 30 tablet 0   potassium chloride (KLOR-CON M) 10 MEQ tablet Take 1 tablet (10 mEq total) by mouth daily. 90 tablet 3   predniSONE (DELTASONE) 20 MG tablet Take 3 tablets (60 mg total) by mouth daily with breakfast. 90 tablet 0   sulfamethoxazole-trimethoprim (BACTRIM) 400-80 MG tablet Take 1 tablet by mouth 3 (three) times a week. 30 tablet 0   acetaminophen (TYLENOL) 325 MG suppository Place 325 mg rectally every 4 (four) hours as needed.     No facility-administered medications prior to visit.    Review of Systems  Review of Systems  Constitutional:  Positive for fatigue.  Respiratory:  Negative for shortness of breath.   Musculoskeletal:        Muscle weakness   Physical Exam  BP (!) 104/58 (BP Location: Left Arm, Patient Position: Sitting, Cuff Size: Normal)   Pulse 75 Comment: 69  Ht 5' 10"$  (1.778 m)   Wt 192 lb (87.1 kg)   SpO2 90% Comment: 98  BMI 27.55 kg/m  Physical Exam Constitutional:      General: He is not in acute distress.    Appearance: Normal appearance. He is obese.  Cardiovascular:     Rate and Rhythm: Normal rate and regular rhythm.  Pulmonary:     Effort: Pulmonary effort is normal.     Breath sounds: Normal breath sounds. No wheezing.  Musculoskeletal:     Comments: BLE weakness/ generalized swelling  Skin:    General: Skin is warm and dry.  Neurological:     General: No focal deficit present.     Mental Status: He is alert and oriented to person, place, and  time. Mental status is at baseline.  Psychiatric:        Mood and Affect: Mood normal.        Behavior: Behavior normal.        Thought Content: Thought content normal.        Judgment: Judgment normal.      Lab Results:  CBC    Component Value Date/Time   WBC 9.0 03/23/2022 0346   RBC 4.37 03/23/2022 0346   HGB 13.4 03/23/2022 0346   HCT 40.9 03/23/2022 0346   PLT 296 03/23/2022 0346   MCV 93.6 03/23/2022 0346   MCH 30.7 03/23/2022  0346   MCHC 32.8 03/23/2022 0346   RDW 14.1 03/23/2022 0346   LYMPHSABS 0.7 03/23/2022 0346   MONOABS 0.6 03/23/2022 0346   EOSABS 0.0 03/23/2022 0346   BASOSABS 0.0 03/23/2022 0346    BMET    Component Value Date/Time   NA 139 04/08/2022 0953   NA 139 12/23/2021 0840   K 4.3 04/08/2022 0953   CL 96 04/08/2022 0953   CO2 31 04/08/2022 0953   GLUCOSE 105 (H) 04/08/2022 0953   BUN 39 (H) 04/08/2022 0953   BUN 28 (H) 12/23/2021 0840   CREATININE 1.02 04/08/2022 0953   CALCIUM 9.3 04/08/2022 0953   GFRNONAA >60 03/23/2022 0346   GFRAA >90 05/04/2011 1312    BNP    Component Value Date/Time   BNP 121.0 (H) 03/20/2022 0609    ProBNP    Component Value Date/Time   PROBNP 98.0 04/08/2022 0953    Imaging: VAS [place] LOWER EXTREMITY VENOUS (DVT)  Result Date: 03/23/2022  Lower Venous DVT Study Patient Name:  [facility]  Date of Exam:   03/22/2022 Medical Rec #: [id number]     Accession #:    [id number] Date of Birth: 03-Mar-1950     Patient Gender: M Patient Age:   54 years Exam Location:  [facility] Procedure:      VAS [place] LOWER EXTREMITY VENOUS (DVT) Referring Phys: [facility] --------------------------------------------------------------------------------  Indications: Swelling, and Myositis.  Comparison Study: No prior study on file Performing Technologist: Sharion Dove RVS  Examination Guidelines: A complete evaluation includes B-mode imaging, spectral Doppler, color Doppler, and power Doppler as needed of all accessible  portions of each vessel. Bilateral testing is considered an integral part of a complete examination. Limited examinations for reoccurring indications may be performed as noted. The reflux portion of the exam is performed with the patient in reverse Trendelenburg.  +--------+---------------+---------+-----------+----------------+-------------+ RIGHT   CompressibilityPhasicitySpontaneityProperties      Thrombus                                                                 Aging         +--------+---------------+---------+-----------+----------------+-------------+ CFV     Full                               pulsatile                                                                waveforms                     +--------+---------------+---------+-----------+----------------+-------------+ SFJ     Full                                                             +--------+---------------+---------+-----------+----------------+-------------+ FV Prox Full                                                             +--------+---------------+---------+-----------+----------------+-------------+  FV Mid  Full                                                             +--------+---------------+---------+-----------+----------------+-------------+ FV      Full                                                             Distal                                                                   +--------+---------------+---------+-----------+----------------+-------------+ PFV     Full                                                             +--------+---------------+---------+-----------+----------------+-------------+ POP     Full                               pulsatile                                                                waveforms                     +--------+---------------+---------+-----------+----------------+-------------+ PTV      Full                                                             +--------+---------------+---------+-----------+----------------+-------------+ PERO    Full                                                             +--------+---------------+---------+-----------+----------------+-------------+   +--------+---------------+---------+-----------+----------------+-------------+ LEFT    CompressibilityPhasicitySpontaneityProperties      Thrombus  Aging         +--------+---------------+---------+-----------+----------------+-------------+ CFV     Full                               pulsatile                                                                waveforms                     +--------+---------------+---------+-----------+----------------+-------------+ SFJ     Full                                                             +--------+---------------+---------+-----------+----------------+-------------+ FV Prox Full                                                             +--------+---------------+---------+-----------+----------------+-------------+ FV Mid  Full                                                             +--------+---------------+---------+-----------+----------------+-------------+ FV      Full                                                             Distal                                                                   +--------+---------------+---------+-----------+----------------+-------------+ PFV     Full                                                             +--------+---------------+---------+-----------+----------------+-------------+ POP     Full                               pulsatile  waveforms                      +--------+---------------+---------+-----------+----------------+-------------+ PTV     Full                                                             +--------+---------------+---------+-----------+----------------+-------------+ PERO    Full                                                             +--------+---------------+---------+-----------+----------------+-------------+     Summary: BILATERAL: - No evidence of deep vein thrombosis seen in the lower extremities, bilaterally. -No evidence of popliteal cyst, bilaterally. RIGHT: pulsatile waveforms  LEFT: Pulsatile waveforms.  *See table(s) above for measurements and observations. Electronically signed by Servando Snare MD on 03/23/2022 at 2:37:52 PM.    Final    MR FEMUR LEFT W WO CONTRAST  Result Date: 03/19/2022 CLINICAL DATA:  Weakness in the lower extremities with bile Oralia Rud, query myositis EXAM: MR OF THE LEFT LOWER EXTREMITY WITHOUT AND WITH CONTRAST TECHNIQUE: Multiplanar, multisequence MR imaging of the left femur/thigh was performed both before and after administration of intravenous contrast. CONTRAST:  75m GADAVIST GADOBUTROL 1 MMOL/ML IV SOLN COMPARISON:  None Available. FINDINGS: Bones/Joint/Cartilage Degenerative arthropathy of the left hip joint, although assessment is limited due to boundary artifact. Similarly the knee is obscured by boundary artifact. No significant abnormal femoral intraosseous signal is observed. Ligaments N/A Muscles and Tendons Scattered regions of muscular edema in the thigh including the hip adductor musculature, sartorius, semimembranosus, and long-head biceps femora S muscles. There is a lesser degree edema in the adductor magnus muscle and relative sparing of the gracilis, semitendinosus, and short head of the biceps femoris. On coronal images the contralateral lower extremity seems to have a relatively similar distribution of patchy edema on STIR images within the musculature, although with a greater  degree of sparing of the adductor magnus. On post-contrast images the same regions of edema demonstrate subtle accentuated enhancement. No fatty infiltration of the muscles. No findings of abscess. Soft tissues Unremarkable IMPRESSION: 1. Scattered regions of muscular edema in the left thigh, primarily involving the adductor musculature, sartorius, semimembranosus, and long-head biceps femora, with relative sparing of the gracilis, semitendinosus, and short head of the biceps femoris. There is a lesser degree of edema in the adductor magnus muscle and relative sparing of the gracilis, semitendinosus, and short head of the biceps femoris. Similar findings appear to be present in the right thigh region on coronal images. The appearance is compatible with nonspecific myositis and differential diagnostic considerations include polymyositis, dermatomyositis, inclusion body myositis, connective tissue disease associated myositis, autoimmune neuropathy, or rhabdomyolysis. 2. Degenerative arthropathy of the left hip joint, although assessment is limited due to boundary artifact. Electronically Signed   By: WVan ClinesM.D.   On: 03/19/2022 18:27     Assessment & Plan:   Myositis - Patient has symptoms of muscle weakness and fatigue. Diagnosed with inflammatory myositis. He is currently on prednisone 669mdaily. He hasn't seen a lot of improvement yet in muscle weakness. He has no  shortness of breath. He saw Dr. Benjamine Mola with Rheumatology on 03/26/22, not felt to have underlying connective tissue disease. Plan continue high dose oral steroids. He had abnormal liver enzymes felt to be secondary to skeletal muscle source with the extremely high CK, normal appearance of liver on recent CT abdomen and no other findings with recent GI workup. He will also be seeing Dr. Posey Pronto with neurology, scheduled for EMG and nerve study. Nothing further from pulmonary standpoint- reviewed with Dr. Valeta Harms   Swelling - Generalized  swelling in hands and legs which is felt to be from steroid and/or myositis. He has lasix 35m as needed, instructed he take this daily for leg swelling.  40 mins spent on case : > 50% face to face with patient   EMartyn Ehrich NP 04/13/2022

## 2022-04-06 NOTE — Progress Notes (Signed)
Thanks as always, happy to see him

## 2022-04-08 ENCOUNTER — Encounter: Payer: Self-pay | Admitting: Primary Care

## 2022-04-08 ENCOUNTER — Ambulatory Visit (INDEPENDENT_AMBULATORY_CARE_PROVIDER_SITE_OTHER): Payer: PPO | Admitting: Primary Care

## 2022-04-08 VITALS — BP 104/58 | HR 75 | Ht 70.0 in | Wt 192.0 lb

## 2022-04-08 DIAGNOSIS — R601 Generalized edema: Secondary | ICD-10-CM

## 2022-04-08 DIAGNOSIS — R609 Edema, unspecified: Secondary | ICD-10-CM

## 2022-04-08 DIAGNOSIS — R0602 Shortness of breath: Secondary | ICD-10-CM

## 2022-04-08 DIAGNOSIS — M609 Myositis, unspecified: Secondary | ICD-10-CM

## 2022-04-08 LAB — THIOPURINE METHYLTRANSFERASE (TPMT), RBC: Thiopurine Methyltransferase, RBC: 15 nmol/hr/mL RBC

## 2022-04-08 LAB — COMPREHENSIVE METABOLIC PANEL
ALT: 220 U/L — ABNORMAL HIGH (ref 0–53)
AST: 199 U/L — ABNORMAL HIGH (ref 0–37)
Albumin: 3.7 g/dL (ref 3.5–5.2)
Alkaline Phosphatase: 57 U/L (ref 39–117)
BUN: 39 mg/dL — ABNORMAL HIGH (ref 6–23)
CO2: 31 mEq/L (ref 19–32)
Calcium: 9.3 mg/dL (ref 8.4–10.5)
Chloride: 96 mEq/L (ref 96–112)
Creatinine, Ser: 1.02 mg/dL (ref 0.40–1.50)
GFR: 73.99 mL/min (ref >60.00)
Glucose, Bld: 105 mg/dL — ABNORMAL HIGH (ref 70–99)
Potassium: 4.3 mEq/L (ref 3.5–5.1)
Sodium: 139 mEq/L (ref 135–145)
Total Bilirubin: 0.8 mg/dL (ref 0.2–1.2)
Total Protein: 6 g/dL (ref 6.0–8.3)

## 2022-04-08 LAB — CK: Total CK: 4387 U/L — ABNORMAL HIGH (ref 7–232)

## 2022-04-08 LAB — BRAIN NATRIURETIC PEPTIDE: Pro B Natriuretic peptide (BNP): 98 pg/mL (ref 0.0–100.0)

## 2022-04-08 LAB — HMGCR AB (IGG): HMGCR AB (IGG): <2 CU (ref <20)

## 2022-04-08 NOTE — Patient Instructions (Addendum)
Recommendations: Follow antiinflammatory diet (avoid sugars, simple carbohydrates and alcohol) I would take lasix 54m daily  Continue physical therapy Follow up with rheumatology and neurology Continue prednisone 645mdaily   Follow-up: 3 months with Dr. IcValeta Harmsr sooner if needed

## 2022-04-09 ENCOUNTER — Telehealth: Payer: Self-pay | Admitting: Primary Care

## 2022-04-09 NOTE — Telephone Encounter (Signed)
See pt e-mail

## 2022-04-09 NOTE — Telephone Encounter (Signed)
I spoke with Dr. Valeta Harms, he agrees there is no much to change from a pulmonary standpoint. CK is trending back up, it was 4,000. Liver enzymes remain elevated similar to previous. He thinks steroids are contributing to the swelling and from my understanding can also be a result from the myositis. Treatment plan is to continue high dose prednisone and follow up with Dr. Benjamine Mola with Rheumatology and Dr. Posey Pronto with neurology. CT abdomen in December with no acute findings of abdomen/pelvis and echo looked normal.

## 2022-04-10 ENCOUNTER — Encounter (HOSPITAL_COMMUNITY): Payer: Self-pay

## 2022-04-10 LAB — SURGICAL PATHOLOGY

## 2022-04-11 NOTE — Progress Notes (Unsigned)
Office Visit Note  Patient: Sean [name]             Date of Birth: February 15, 1951           MRN: [id number]             PCP: [name], MD Referring: [name], MD Visit Date: 04/13/2022   Subjective:  No chief complaint on file.   History of Present Illness: Sean [name] is a 72 y.o. male here for follow up ***   Previous HPI 03/26/22 Sean [name] is a 72 y.o. male here for myositis after recent hospital discharge.  He has experienced progressive weakening in proximal muscles as well as increased peripheral edema ongoing for months leading up to this evaluation.  Increase in his swelling and some dyspnea on exertion prompting cardiology evaluation in October and due to abnormal liver function test he was discontinued off of rosuvastatin at that time.  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  Over the next few months until January he experienced ongoing unintentional weight loss down about 20 pounds and worsening exertion intolerance difficulty with mobility and noticed progressive hunching over of his shoulders with height loss.  He saw gastroenterology due to the unintentional weight loss and abnormal LFTs with CT scan of the abdomen and pelvis with contrast was entirely normal besides prostate hypertrophy.  He eventually had updated echocardiogram and cardiac PET CT scan for evaluation showing normal function no abnormal metabolic uptake in the associated chest CT was negative for abnormal findings.  He saw Dr. Jamey Ripa in pulmonology clinic due to his dyspnea despite normal cardiac workup where he was noted to have some myalgias and proximal weakness as well as apparent Raynaud's symptoms with pallor in several digits prompted concern for connective tissue disease.  Laboratory testing showed highly elevated CK 6777 and aldolase 83.1 and highly positive ANA antibody  titer 1:1280.  He was admitted to the hospital and started on high-dose steroids to expedite muscle biopsy with sampling from the left vastus lateralis collected on February 5. He is currently taking prednisone 60 mg daily with plan to taper down by 20 mg/day dose each month and also has plans to follow-up with Dr. Posey Pronto for EMG study.  Remains quite weak compared to his baseline that he is able to walk without assistance just only limited to short distances.  He is noticing some dyspnea and nonproductive cough especially at nighttime when lying flat.  Denies any significant dysphagia.  He is not seeing any of the ongoing discoloration occurring in his fingers and toes.  No new skin rashes.     No Rheumatology ROS completed.   PMFS History:  Patient Active Problem List   Diagnosis Date Noted   Long term (current) use of systemic steroids 03/26/2022   Pedal edema 03/26/2022   Myositis 03/18/2022   Alcohol addiction ([place]) 03/18/2022   Hypothyroidism 03/18/2022   Elevated transaminase level 03/18/2022   RBBB 10/24/2015   Coronary artery disease    Tachycardia 05/09/2011   Abnormal liver enzymes 05/06/2011   HTN (hypertension) 04/29/2011   HLD (hyperlipidemia) 04/29/2011    Past Medical History:  Diagnosis Date   Alcohol addiction ([place])    2010- admitted for withdrawal , detox admission after that   Arrhythmia    Coronary artery disease    95% mid LAD s/p  BMS of LAD with aneurysmal segment   Depression    ED (erectile dysfunction)    HLD (hyperlipidemia)    HTN (hypertension)    Mental disorder    Peptic ulcer    RBBB 10/24/2015    Family History  Problem Relation Age of Onset   Prostate cancer Father    Heart attack Brother 60   Anesthesia problems Neg Hx    Hypotension Neg Hx    Malignant hyperthermia Neg Hx    Pseudochol deficiency Neg Hx    Colon cancer Neg Hx    Stomach cancer Neg Hx    Esophageal cancer Neg Hx    Colon polyps Neg Hx    Past Surgical History:   Procedure Laterality Date   CORONARY STENT PLACEMENT     MUSCLE BIOPSY Left 03/23/2022   Procedure: LEFT THIGH MUSCLE BIOPSY;  Surgeon: Jesusita Oka, MD;  Location: [facility] OR;  Service: General;  Laterality: Left;   Social History   Social History Narrative   Live with wife in [place], is an Chief Financial Officer, currently driving trucks for a local firm. Quit smoking 1990 after smoking for 20 years. Drinks heavilyu (1 bottle of vodka or 18-24 beers a day). Used to smoke marijuana but quit in 1990s. Has united healthcare insurance. Goes to [employer] for alcohol cessation   Immunization History  Administered Date(s) Administered   Fluad Quad(high Dose 65+) 10/20/2021   PFIZER(Purple Top)SARS-COV-2 Vaccination 04/01/2019     Objective: Vital Signs: There were no vitals taken for this visit.   Physical Exam   Musculoskeletal Exam: ***  CDAI Exam: CDAI Score: -- Patient Global: --; Provider Global: -- Swollen: --; Tender: -- Joint Exam 04/13/2022   No joint exam has been documented for this visit   There is currently no information documented on the homunculus. Go to the Rheumatology activity and complete the homunculus joint exam.  Investigation: No additional findings.  Imaging: VAS [place] LOWER EXTREMITY VENOUS (DVT)  Result Date: 03/23/2022  Lower Venous DVT Study Patient Name:  Sean [name]  Date of Exam:   03/22/2022 Medical Rec #: [id number]     Accession #:    [id number] Date of Birth: 1950-02-26     Patient Gender: M Patient Age:   79 years Exam Location:  [facility] Procedure:      VAS [place] LOWER EXTREMITY VENOUS (DVT) Referring Phys: [facility] EZENDUKA --------------------------------------------------------------------------------  Indications: Swelling, and Myositis.  Comparison Study: No prior study on file Performing Technologist: Sharion Dove RVS  Examination Guidelines: A complete evaluation includes B-mode imaging, spectral Doppler, color Doppler, and power Doppler as  needed of all accessible portions of each vessel. Bilateral testing is considered an integral part of a complete examination. Limited examinations for reoccurring indications may be performed as noted. The reflux portion of the exam is performed with the patient in reverse Trendelenburg.  +--------+---------------+---------+-----------+----------------+-------------+ RIGHT   CompressibilityPhasicitySpontaneityProperties      Thrombus                                                                 Aging         +--------+---------------+---------+-----------+----------------+-------------+ CFV     Full  pulsatile                                                                waveforms                     +--------+---------------+---------+-----------+----------------+-------------+ SFJ     Full                                                             +--------+---------------+---------+-----------+----------------+-------------+ FV Prox Full                                                             +--------+---------------+---------+-----------+----------------+-------------+ FV Mid  Full                                                             +--------+---------------+---------+-----------+----------------+-------------+ FV      Full                                                             Distal                                                                   +--------+---------------+---------+-----------+----------------+-------------+ PFV     Full                                                             +--------+---------------+---------+-----------+----------------+-------------+ POP     Full                               pulsatile                                                                waveforms                      +--------+---------------+---------+-----------+----------------+-------------+  PTV     Full                                                             +--------+---------------+---------+-----------+----------------+-------------+ PERO    Full                                                             +--------+---------------+---------+-----------+----------------+-------------+   +--------+---------------+---------+-----------+----------------+-------------+ LEFT    CompressibilityPhasicitySpontaneityProperties      Thrombus                                                                 Aging         +--------+---------------+---------+-----------+----------------+-------------+ CFV     Full                               pulsatile                                                                waveforms                     +--------+---------------+---------+-----------+----------------+-------------+ SFJ     Full                                                             +--------+---------------+---------+-----------+----------------+-------------+ FV Prox Full                                                             +--------+---------------+---------+-----------+----------------+-------------+ FV Mid  Full                                                             +--------+---------------+---------+-----------+----------------+-------------+ FV      Full  Distal                                                                   +--------+---------------+---------+-----------+----------------+-------------+ PFV     Full                                                             +--------+---------------+---------+-----------+----------------+-------------+ POP     Full                               pulsatile                                                                 waveforms                     +--------+---------------+---------+-----------+----------------+-------------+ PTV     Full                                                             +--------+---------------+---------+-----------+----------------+-------------+ PERO    Full                                                             +--------+---------------+---------+-----------+----------------+-------------+     Summary: BILATERAL: - No evidence of deep vein thrombosis seen in the lower extremities, bilaterally. -No evidence of popliteal cyst, bilaterally. RIGHT: pulsatile waveforms  LEFT: Pulsatile waveforms.  *See table(s) above for measurements and observations. Electronically signed by Servando Snare MD on 03/23/2022 at 2:37:52 PM.    Final    MR FEMUR LEFT W WO CONTRAST  Result Date: 03/19/2022 CLINICAL DATA:  Weakness in the lower extremities with bile Oralia Rud, query myositis EXAM: MR OF THE LEFT LOWER EXTREMITY WITHOUT AND WITH CONTRAST TECHNIQUE: Multiplanar, multisequence MR imaging of the left femur/thigh was performed both before and after administration of intravenous contrast. CONTRAST:  81m GADAVIST GADOBUTROL 1 MMOL/ML IV SOLN COMPARISON:  None Available. FINDINGS: Bones/Joint/Cartilage Degenerative arthropathy of the left hip joint, although assessment is limited due to boundary artifact. Similarly the knee is obscured by boundary artifact. No significant abnormal femoral intraosseous signal is observed. Ligaments N/A Muscles and Tendons Scattered regions of muscular edema in the thigh including the hip adductor musculature, sartorius, semimembranosus, and long-head biceps femora S muscles. There is a lesser degree edema in the adductor magnus muscle and relative sparing of the gracilis, semitendinosus, and short head of the biceps femoris. On  coronal images the contralateral lower extremity seems to have a relatively similar distribution of patchy edema on STIR  images within the musculature, although with a greater degree of sparing of the adductor magnus. On post-contrast images the same regions of edema demonstrate subtle accentuated enhancement. No fatty infiltration of the muscles. No findings of abscess. Soft tissues Unremarkable IMPRESSION: 1. Scattered regions of muscular edema in the left thigh, primarily involving the adductor musculature, sartorius, semimembranosus, and long-head biceps femora, with relative sparing of the gracilis, semitendinosus, and short head of the biceps femoris. There is a lesser degree of edema in the adductor magnus muscle and relative sparing of the gracilis, semitendinosus, and short head of the biceps femoris. Similar findings appear to be present in the right thigh region on coronal images. The appearance is compatible with nonspecific myositis and differential diagnostic considerations include polymyositis, dermatomyositis, inclusion body myositis, connective tissue disease associated myositis, autoimmune neuropathy, or rhabdomyolysis. 2. Degenerative arthropathy of the left hip joint, although assessment is limited due to boundary artifact. Electronically Signed   By: Van Clines M.D.   On: 03/19/2022 18:27    Recent Labs: Lab Results  Component Value Date   WBC 9.0 03/23/2022   HGB 13.4 03/23/2022   PLT 296 03/23/2022   NA 139 04/08/2022   K 4.3 04/08/2022   CL 96 04/08/2022   CO2 31 04/08/2022   GLUCOSE 105 (H) 04/08/2022   BUN 39 (H) 04/08/2022   CREATININE 1.02 04/08/2022   BILITOT 0.8 04/08/2022   ALKPHOS 57 04/08/2022   AST 199 (H) 04/08/2022   ALT 220 (H) 04/08/2022   PROT 6.0 04/08/2022   ALBUMIN 3.7 04/08/2022   CALCIUM 9.3 04/08/2022   GFRAA >90 05/04/2011    Speciality Comments: No specialty comments available.  Procedures:  No procedures performed Allergies: Patient has no known allergies.   Assessment / Plan:     Visit Diagnoses: No diagnosis found.  ***  Orders: No orders of  the defined types were placed in this encounter.  No orders of the defined types were placed in this encounter.    Follow-Up Instructions: No follow-ups on file.   Collier Salina, MD  Note - This record has been created using [employer].  Chart creation errors have been sought, but may not always  have been located. Such creation errors do not reflect on  the standard of medical care.

## 2022-04-13 ENCOUNTER — Ambulatory Visit: Payer: PPO | Attending: Internal Medicine | Admitting: Internal Medicine

## 2022-04-13 ENCOUNTER — Encounter: Payer: Self-pay | Admitting: Internal Medicine

## 2022-04-13 VITALS — BP 108/65 | HR 73 | Resp 20 | Ht 70.0 in | Wt 196.0 lb

## 2022-04-13 DIAGNOSIS — Z7952 Long term (current) use of systemic steroids: Secondary | ICD-10-CM

## 2022-04-13 DIAGNOSIS — R600 Localized edema: Secondary | ICD-10-CM | POA: Diagnosis not present

## 2022-04-13 DIAGNOSIS — R609 Edema, unspecified: Secondary | ICD-10-CM | POA: Insufficient documentation

## 2022-04-13 DIAGNOSIS — M6089 Other myositis, multiple sites: Secondary | ICD-10-CM | POA: Diagnosis not present

## 2022-04-13 MED ORDER — AZATHIOPRINE 50 MG PO TABS: ORAL_TABLET | ORAL | 0 refills | Status: DC

## 2022-04-13 MED ORDER — PREDNISONE 20 MG PO TABS: ORAL_TABLET | ORAL | 0 refills | Status: DC

## 2022-04-13 NOTE — Patient Instructions (Signed)
Azathioprine Tablets What is this medication? AZATHIOPRINE (ay za THYE oh preen) prevents the body from rejecting an organ transplant. It works by lowering the body's immune system response. This helps the body accept the donor organ. It may also be used to treat rheumatoid arthritis. This medicine may be used for other purposes; ask your health care provider or pharmacist if you have questions. COMMON BRAND NAME(S): Azasan, Imuran What should I tell my care team before I take this medication? They need to know if you have any of these conditions: Infection Kidney disease Liver disease An unusual or allergic reaction to azathioprine, lactose, other medications, foods, dyes, or preservatives Pregnant or trying to get pregnant Breastfeeding How should I use this medication? Take this medication by mouth with a full glass of water. Take it as directed on the prescription label at the same time every day. Keep taking it unless your care team tells you to stop. Keep taking it even if you think you are better. Talk to your care team about the use of this medication in children. Special care may be needed. Overdosage: If you think you have taken too much of this medicine contact a poison control center or emergency room at once. NOTE: This medicine is only for you. Do not share this medicine with others. What if I miss a dose? If you miss a dose, take it as soon as you can. If it is almost time for your next dose, take only that dose. Do not take double or extra doses. What may interact with this medication? Do not take this medication with any of the following: Febuxostat Mercaptopurine This medication may also interact with the following: Allopurinol Aminosalicylates, such as sulfasalazine, mesalamine, balsalazide, and olsalazine Leflunomide Medications called ACE inhibitors, such as benazepril, captopril, enalapril, fosinopril, quinapril, lisinopril, ramipril, and  trandolapril Mycophenolate Sulfamethoxazole; trimethoprim Vaccines Warfarin This list may not describe all possible interactions. Give your health care provider a list of all the medicines, herbs, non-prescription drugs, or dietary supplements you use. Also tell them if you smoke, drink alcohol, or use illegal drugs. Some items may interact with your medicine. What should I watch for while using this medication? Visit your care team for regular checks on your progress. You may need blood work done while you are taking this medication. This medication may increase your risk of getting an infection. Call your care team for advice if you get a fever, chills, sore throat, or other symptoms of a cold or flu. Do not treat yourself. Try to avoid being around people who are sick. Talk to your care team about your risk of cancer. You may be more at risk for certain types of cancer if you take this medication. Talk to your care team if you may be pregnant. This medication can cause serious birth defects if taken during pregnancy. This medication may cause infertility. Talk to your care team if you are concerned about your fertility. What side effects may I notice from receiving this medication? Side effects that you should report to your care team as soon as possible: Allergic reactions--skin rash, itching, hives, swelling of the face, lips, tongue, or throat Change in your skin, such as a new growth, a sore that doesn't heal, or a change in a mole Dizziness, loss of balance or coordination, confusion or trouble speaking Infection--fever, chills, cough, sore throat, wounds that don't heal, pain or trouble when passing urine, general feeling of discomfort or being unwell Low red blood cell level--unusual  weakness or fatigue, dizziness, headache, trouble breathing Unusual bruising or bleeding Side effects that usually do not require medical attention (report to your care team if they continue or are  bothersome): Diarrhea Fatigue Nausea Vomiting This list may not describe all possible side effects. Call your doctor for medical advice about side effects. You may report side effects to FDA at 1-800-FDA-1088. Where should I keep my medication? Keep out of the reach of children and pets. Store at room temperature between 15 and 25 degrees C (59 and 77 degrees F). Protect from light. Get rid of any unused medication after the expiration date. To get rid of medications that are no longer needed or have expired: Take the medication to a medication take-back program. Check with your pharmacy or law enforcement to find a location. If you cannot return the medication, check the label or package insert to see if the medication should be thrown out in the garbage or flushed down the toilet. If you are not sure, ask your care team. If it is safe to put it in the trash, empty the medication out of the container. Mix the medication with cat litter, dirt, coffee grounds, or other unwanted substance. Seal the mixture in a bag or container. Put it in the trash. NOTE: This sheet is a summary. It may not cover all possible information. If you have questions about this medicine, talk to your doctor, pharmacist, or health care provider.  2023 Elsevier/Gold Standard (2021-07-01 00:00:00)

## 2022-04-13 NOTE — Assessment & Plan Note (Signed)
-   Generalized swelling in hands and legs which is felt to be from steroid and/or myositis. He has lasix 34m as needed, instructed he take this daily for leg swelling.

## 2022-04-13 NOTE — Assessment & Plan Note (Addendum)
-   Patient has symptoms of muscle weakness and fatigue. Diagnosed with inflammatory myositis. He is currently on prednisone 41m daily. He hasn't seen a lot of improvement yet in muscle weakness. He has no shortness of breath. He saw Dr. RBenjamine Molawith Rheumatology on 03/26/22, not felt to have underlying connective tissue disease. Plan continue high dose oral steroids. He had abnormal liver enzymes felt to be secondary to skeletal muscle source with the extremely high CK, normal appearance of liver on recent CT abdomen and no other findings with recent GI workup. He will also be seeing Dr. PPosey Prontowith neurology, scheduled for EMG and nerve study. Nothing further from pulmonary standpoint, recommend patient follow antiinflammatory diet (avoid sugars, simple carbohydrates and alcohol). Reviewed case with Dr. IValeta Harms

## 2022-04-17 DIAGNOSIS — Z9181 History of falling: Secondary | ICD-10-CM | POA: Diagnosis not present

## 2022-04-17 DIAGNOSIS — E785 Hyperlipidemia, unspecified: Secondary | ICD-10-CM | POA: Diagnosis not present

## 2022-04-17 DIAGNOSIS — Z8711 Personal history of peptic ulcer disease: Secondary | ICD-10-CM | POA: Diagnosis not present

## 2022-04-17 DIAGNOSIS — M609 Myositis, unspecified: Secondary | ICD-10-CM | POA: Diagnosis not present

## 2022-04-17 DIAGNOSIS — I2510 Atherosclerotic heart disease of native coronary artery without angina pectoris: Secondary | ICD-10-CM | POA: Diagnosis not present

## 2022-04-17 DIAGNOSIS — I10 Essential (primary) hypertension: Secondary | ICD-10-CM | POA: Diagnosis not present

## 2022-04-17 DIAGNOSIS — Z87891 Personal history of nicotine dependence: Secondary | ICD-10-CM | POA: Diagnosis not present

## 2022-04-17 DIAGNOSIS — Z7952 Long term (current) use of systemic steroids: Secondary | ICD-10-CM | POA: Diagnosis not present

## 2022-04-17 DIAGNOSIS — Z79899 Other long term (current) drug therapy: Secondary | ICD-10-CM | POA: Diagnosis not present

## 2022-04-17 DIAGNOSIS — E039 Hypothyroidism, unspecified: Secondary | ICD-10-CM | POA: Diagnosis not present

## 2022-04-17 DIAGNOSIS — Z7902 Long term (current) use of antithrombotics/antiplatelets: Secondary | ICD-10-CM | POA: Diagnosis not present

## 2022-04-22 DIAGNOSIS — Z87891 Personal history of nicotine dependence: Secondary | ICD-10-CM | POA: Diagnosis not present

## 2022-04-22 DIAGNOSIS — M609 Myositis, unspecified: Secondary | ICD-10-CM | POA: Diagnosis not present

## 2022-04-22 DIAGNOSIS — I10 Essential (primary) hypertension: Secondary | ICD-10-CM | POA: Diagnosis not present

## 2022-04-22 DIAGNOSIS — Z8711 Personal history of peptic ulcer disease: Secondary | ICD-10-CM | POA: Diagnosis not present

## 2022-04-22 DIAGNOSIS — I2510 Atherosclerotic heart disease of native coronary artery without angina pectoris: Secondary | ICD-10-CM | POA: Diagnosis not present

## 2022-04-22 DIAGNOSIS — Z7902 Long term (current) use of antithrombotics/antiplatelets: Secondary | ICD-10-CM | POA: Diagnosis not present

## 2022-04-22 DIAGNOSIS — Z9181 History of falling: Secondary | ICD-10-CM | POA: Diagnosis not present

## 2022-04-22 DIAGNOSIS — Z7952 Long term (current) use of systemic steroids: Secondary | ICD-10-CM | POA: Diagnosis not present

## 2022-04-22 DIAGNOSIS — E785 Hyperlipidemia, unspecified: Secondary | ICD-10-CM | POA: Diagnosis not present

## 2022-04-22 DIAGNOSIS — E039 Hypothyroidism, unspecified: Secondary | ICD-10-CM | POA: Diagnosis not present

## 2022-04-22 DIAGNOSIS — Z79899 Other long term (current) drug therapy: Secondary | ICD-10-CM | POA: Diagnosis not present

## 2022-04-27 ENCOUNTER — Encounter: Payer: Self-pay | Admitting: Internal Medicine

## 2022-04-27 ENCOUNTER — Ambulatory Visit: Payer: PPO | Admitting: Internal Medicine

## 2022-04-28 DIAGNOSIS — Z87891 Personal history of nicotine dependence: Secondary | ICD-10-CM | POA: Diagnosis not present

## 2022-04-28 DIAGNOSIS — Z8711 Personal history of peptic ulcer disease: Secondary | ICD-10-CM | POA: Diagnosis not present

## 2022-04-28 DIAGNOSIS — M609 Myositis, unspecified: Secondary | ICD-10-CM | POA: Diagnosis not present

## 2022-04-28 DIAGNOSIS — E039 Hypothyroidism, unspecified: Secondary | ICD-10-CM | POA: Diagnosis not present

## 2022-04-28 DIAGNOSIS — Z7952 Long term (current) use of systemic steroids: Secondary | ICD-10-CM | POA: Diagnosis not present

## 2022-04-28 DIAGNOSIS — I10 Essential (primary) hypertension: Secondary | ICD-10-CM | POA: Diagnosis not present

## 2022-04-28 DIAGNOSIS — E785 Hyperlipidemia, unspecified: Secondary | ICD-10-CM | POA: Diagnosis not present

## 2022-04-28 DIAGNOSIS — I2510 Atherosclerotic heart disease of native coronary artery without angina pectoris: Secondary | ICD-10-CM | POA: Diagnosis not present

## 2022-04-28 DIAGNOSIS — Z79899 Other long term (current) drug therapy: Secondary | ICD-10-CM | POA: Diagnosis not present

## 2022-04-28 DIAGNOSIS — Z7902 Long term (current) use of antithrombotics/antiplatelets: Secondary | ICD-10-CM | POA: Diagnosis not present

## 2022-04-28 DIAGNOSIS — Z9181 History of falling: Secondary | ICD-10-CM | POA: Diagnosis not present

## 2022-04-30 ENCOUNTER — Ambulatory Visit: Payer: PPO | Admitting: Neurology

## 2022-04-30 DIAGNOSIS — G729 Myopathy, unspecified: Secondary | ICD-10-CM

## 2022-04-30 DIAGNOSIS — R202 Paresthesia of skin: Secondary | ICD-10-CM

## 2022-04-30 NOTE — Procedures (Signed)
[facility]  [place], [place] [address]  [place], [place] [address] Tel: [phone] Fax: [phone] Test Date:  04/30/2022  Patient: Sean [name] DOB: Nov 07, 1950 Physician: Narda Amber, DO  Sex: Male Height: '5\' 10"'$  Ref Phys: [name], MD  ID#: [id number]   Technician:    History: This is a 72 year old man referred for evaluation of myositis.  NCV & EMG Findings: Extensive electrodiagnostic testing of the right lower extremity and additional studies of the left shows:  Bilateral sural and superficial peroneal sensory responses are within normal limits. Bilateral peroneal (extensor digitorum brevis and tibialis anterior) and right tibial motor responses show reduced amplitudes.  Left tibial motor responses within normal limits. Bilateral tibial H reflex studies are prolonged.   Myopathic motor units were seen affecting the proximal muscles including gluteus medius, iliacus, and rectus femoris muscles bilaterally, with accompanying active denervation.  Additionally, in the right lower extremity, there is evidence of chronic S1 radiculopathy.   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 Chronic right S1 radiculopathy, mild. There is no evidence of a large fiber sensorimotor polyneuropathy affecting the lower extremities.   ___________________________ Narda Amber, DO    Nerve Conduction Studies   Stim Site NR Peak (ms) Norm Peak (ms) O-P Amp (V) Norm O-P Amp  Left Sup Peroneal Anti Sensory (Ant Lat Mall)  32 C  12 cm    2.5 <4.6 4.2 >3  Right Sup Peroneal Anti Sensory (Ant Lat Mall)  32 C  12 cm    2.8 <4.6 4.3 >3  Left Sural Anti Sensory (Lat Mall)  32 C  Calf    2.7 <4.6 5.2 >3  Right Sural Anti Sensory (Lat Mall)  32 C  Calf    2.8 <4.6 5.6 >3     Stim Site NR Onset (ms) Norm Onset (ms) O-P Amp (mV) Norm O-P Amp Site1 Site2 Delta-0 (ms) Dist (cm) Vel (m/s) Norm Vel (m/s)  Left  Peroneal Motor (Ext Dig Brev)  32 C  Ankle    5.8 <6.0 *2.4 >2.5 B Fib Ankle 8.1 33.0 41 >40  B Fib    13.9  2.4  Poplt B Fib 2.0 9.0 45 >40  Poplt    15.9  2.3         Right Peroneal Motor (Ext Dig Brev)  32 C  Ankle    4.2 <6.0 *2.2 >2.5 B Fib Ankle 9.2 37.0 40 >40  B Fib    13.4  2.0  Poplt B Fib 2.2 9.0 41 >40  Poplt    15.6  1.9         Left Peroneal TA Motor (Tib Ant)  32 C  Fib Head    3.1 <4.5 *2.2 >3 Poplit Fib Head 1.9 9.0 47 >40  Poplit    5.0 <5.7 1.9         Right Peroneal TA Motor (Tib Ant)  32 C  Fib Head    2.8 <4.5 *2.4 >3 Poplit Fib Head 1.5 9.0 60 >40  Poplit    4.3 <5.7 2.3         Left Tibial Motor (Abd Hall Brev)  32 C  Ankle    5.9 <6.0 6.1 >4 Knee Ankle 9.6 39.0 41 >40  Knee    15.5  4.1         Right Tibial Motor (Abd Hall Brev)  32 C  Ankle    5.5 <6.0 *1.5 >4 Knee Ankle  11.1 44.0 40 >40  Knee    16.6  1.5          Electromyography   Side Muscle Ins.Act Fibs Fasc Recrt Amp Dur Poly Activation Comment  Right AntTibialis Nml Nml Nml Nml Nml Nml Nml Nml N/A  Right Gastroc Nml Nml Nml *1- *1+ *1+ *1+ Nml N/A  Right RectFemoris Nml Nml Nml *Early *1- *1+ *1+ Nml N/A  Right BicepsFemS Nml Nml Nml *1- *1+ *1+ *1+ Nml N/A  Right GluteusMed Nml *1+ Nml *Early *1- *1+ *1+ Nml N/A  Right Iliacus Nml Nml Nml *Early *1- *1+ *1+ Nml N/A  Right Lumbo Parasp Low Nml *2+ Nml *Early *1- *1+ *1+ Nml N/A  Left AntTibialis Nml Nml Nml Nml Nml Nml Nml Nml N/A  Left Gastroc Nml Nml Nml Nml Nml Nml Nml Nml N/A  Left BicepsFemS Nml Nml Nml *Early *1- *1+ *1+ Nml N/A  Left VastusLat Nml *1+ Nml *Early *1- *1+ *1+ Nml N/A  Left GluteusMed Nml *1+ Nml *Early *1- *1+ *1+ Nml N/A      Waveforms:

## 2022-05-01 DIAGNOSIS — E039 Hypothyroidism, unspecified: Secondary | ICD-10-CM | POA: Diagnosis not present

## 2022-05-01 DIAGNOSIS — E785 Hyperlipidemia, unspecified: Secondary | ICD-10-CM | POA: Diagnosis not present

## 2022-05-01 DIAGNOSIS — Z9181 History of falling: Secondary | ICD-10-CM | POA: Diagnosis not present

## 2022-05-01 DIAGNOSIS — I10 Essential (primary) hypertension: Secondary | ICD-10-CM | POA: Diagnosis not present

## 2022-05-01 DIAGNOSIS — Z8711 Personal history of peptic ulcer disease: Secondary | ICD-10-CM | POA: Diagnosis not present

## 2022-05-01 DIAGNOSIS — M609 Myositis, unspecified: Secondary | ICD-10-CM | POA: Diagnosis not present

## 2022-05-01 DIAGNOSIS — I2510 Atherosclerotic heart disease of native coronary artery without angina pectoris: Secondary | ICD-10-CM | POA: Diagnosis not present

## 2022-05-01 DIAGNOSIS — Z87891 Personal history of nicotine dependence: Secondary | ICD-10-CM | POA: Diagnosis not present

## 2022-05-01 DIAGNOSIS — Z7902 Long term (current) use of antithrombotics/antiplatelets: Secondary | ICD-10-CM | POA: Diagnosis not present

## 2022-05-01 DIAGNOSIS — Z79899 Other long term (current) drug therapy: Secondary | ICD-10-CM | POA: Diagnosis not present

## 2022-05-01 DIAGNOSIS — Z7952 Long term (current) use of systemic steroids: Secondary | ICD-10-CM | POA: Diagnosis not present

## 2022-05-04 NOTE — Progress Notes (Unsigned)
Office Visit Note  Patient: Sean [name]             Date of Birth: 1950-10-25           MRN: [id number]             PCP: [name], MD Referring: [name], MD Visit Date: 05/05/2022   Subjective:  No chief complaint on file.   History of Present Illness: Sean [name] is a 72 y.o. male here for follow up ***   Previous HPI 04/13/22 Sean [name] is a 72 y.o. male here for follow up for myositis after initial visit almost 3 weeks ago he continues on prednisone 60 mg daily.  He started working with physical therapy and has noticed very mild improvements in strength for example is able to brace his foot up 1 step at a time unaided on the right side.  No interval worsening of strength elsewhere no swallowing or respiratory difficulties.  That follow-up with Dr. Orland Mustard last week he had repeat lab test with CK more elevated again to 4,387 up from 1,164 at his hospital discharge.  BNP was normal at 98. He was taking Lasix at increased dose to daily for edema with some benefit but continues to have swelling in bilateral hands and legs.  Myositis antibody panel has resulted with positive for 52 kDa SSA antibody.  Muscle biopsy result was pretty nonspecific.   03/23/22 Muscle Biopsy A. MUSCLE, LEFT THIGH, BIOPSY:  - Mildly abnormal muscle showing regenerative fibrosis.  Presence of COX negative fibrous could be age-related, but the presence of regenerating fibers could be in agreement with the diagnosis of myositis; however, there are no inflammatory infiltrates.  Clinical correlation is suggested.   Previous HPI 03/26/22 Sean [name] is a 72 y.o. male here for myositis after recent hospital discharge.  He has experienced progressive weakening in proximal muscles as well as increased peripheral edema ongoing for months leading up to this evaluation.  Increase in his swelling and some dyspnea on exertion prompting cardiology evaluation in October and due to abnormal liver function test he was  discontinued off of rosuvastatin at that time.  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  Over the next few months until January he experienced ongoing unintentional weight loss down about 20 pounds and worsening exertion intolerance difficulty with mobility and noticed progressive hunching over of his shoulders with height loss.  He saw gastroenterology due to the unintentional weight loss and abnormal LFTs with CT scan of the abdomen and pelvis with contrast was entirely normal besides prostate hypertrophy.  He eventually had updated echocardiogram and cardiac PET CT scan for evaluation showing normal function no abnormal metabolic uptake in the associated chest CT was negative for abnormal findings.  He saw Dr. Jamey Ripa in pulmonology clinic due to his dyspnea despite normal cardiac workup where he was noted to have some myalgias and proximal weakness as well as apparent Raynaud's symptoms with pallor in several digits prompted concern for connective tissue disease.  Laboratory testing showed highly elevated CK 6777 and aldolase 83.1 and highly positive ANA antibody titer 1:1280.  He was admitted to the hospital and started on high-dose steroids to expedite muscle biopsy with sampling from the left vastus lateralis collected on February 5. He is currently taking prednisone 60 mg daily with plan to taper down by  20 mg/day dose each month and also has plans to follow-up with Dr. Posey Pronto for EMG study.  Remains quite weak compared to his baseline that he is able to walk without assistance just only limited to short distances.  He is noticing some dyspnea and nonproductive cough especially at nighttime when lying flat.  Denies any significant dysphagia.  He is not seeing any of the ongoing discoloration occurring in his fingers and toes.  No new skin rashes.   No Rheumatology ROS completed.    PMFS History:  Patient Active Problem List   Diagnosis Date Noted   Swelling 04/13/2022   Long term (current) use of systemic steroids 03/26/2022   Pedal edema 03/26/2022   Myositis 03/18/2022   Alcohol addiction ([place]) 03/18/2022   Hypothyroidism 03/18/2022   Elevated transaminase level 03/18/2022   RBBB 10/24/2015   Coronary artery disease    Tachycardia 05/09/2011   Abnormal liver enzymes 05/06/2011   HTN (hypertension) 04/29/2011   HLD (hyperlipidemia) 04/29/2011    Past Medical History:  Diagnosis Date   Alcohol addiction ([place])    2010- admitted for withdrawal , detox admission after that   Arrhythmia    Coronary artery disease    95% mid LAD s/p BMS of LAD with aneurysmal segment   Depression    ED (erectile dysfunction)    HLD (hyperlipidemia)    HTN (hypertension)    Mental disorder    Peptic ulcer    RBBB 10/24/2015    Family History  Problem Relation Age of Onset   Prostate cancer Father    Heart attack Brother 67   Anesthesia problems Neg Hx    Hypotension Neg Hx    Malignant hyperthermia Neg Hx    Pseudochol deficiency Neg Hx    Colon cancer Neg Hx    Stomach cancer Neg Hx    Esophageal cancer Neg Hx    Colon polyps Neg Hx    Past Surgical History:  Procedure Laterality Date   CORONARY STENT PLACEMENT     MUSCLE BIOPSY Left 03/23/2022   Procedure: LEFT THIGH MUSCLE BIOPSY;  Surgeon: Jesusita Oka, MD;  Location: [facility] OR;  Service: General;  Laterality: Left;   Social History   Social History Narrative   Live with wife in [place], is an Chief Financial Officer, currently driving trucks for a local firm. Quit smoking 1990 after smoking for 20 years. Drinks heavilyu (1 bottle of vodka or 18-24 beers a day). Used to smoke marijuana but quit in 1990s. Has united healthcare insurance. Goes to [facility] [facility] for alcohol cessation   Immunization History  Administered Date(s) Administered   Fluad Quad(high Dose 65+) 10/20/2021   PFIZER(Purple Top)SARS-COV-2  Vaccination 04/01/2019     Objective: Vital Signs: There were no vitals taken for this visit.   Physical Exam   Musculoskeletal Exam: ***  CDAI Exam: CDAI Score: -- Patient Global: --; Provider Global: -- Swollen: --; Tender: -- Joint Exam 05/05/2022   No joint exam has been documented for this visit   There is currently no information documented on the homunculus. Go to the Rheumatology activity and complete the homunculus joint exam.  Investigation: No additional findings.  Imaging: NCV with EMG(electromyography)  Result Date: 04/30/2022 Alda Berthold, DO     04/30/2022  2:10 PM [facility] [address], [place]  [place], [place] [address] Tel: [phone] Fax: [phone] Test Date:  04/30/2022 Patient: Jamarquez Dalto DOB: August 06, 1950 Physician: Narda Amber, DO Sex: Male Height: 5\' 10"  Ref  Phys: [name], MD ID#: [id number]   Technician:  History: This is a 72 year old man referred for evaluation of myositis. NCV & EMG Findings: Extensive electrodiagnostic testing of the right lower extremity and additional studies of the left shows: Bilateral sural and superficial peroneal sensory responses are within normal limits. Bilateral peroneal (extensor digitorum brevis and tibialis anterior) and right tibial motor responses show reduced amplitudes.  Left tibial motor responses within normal limits. Bilateral tibial H reflex studies are prolonged.  Myopathic motor units were seen affecting the proximal muscles including gluteus medius, iliacus, and rectus femoris muscles bilaterally, with accompanying active denervation.  Additionally, in the right lower extremity, there is evidence of chronic S1 radiculopathy. 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 Chronic right S1 radiculopathy, mild. There is no evidence of a large fiber sensorimotor polyneuropathy affecting the lower extremities.  ___________________________ Narda Amber, DO Nerve Conduction Studies  Stim Site NR Peak (ms) Norm Peak (ms) O-P Amp (V) Norm O-P Amp Left Sup Peroneal Anti Sensory (Ant Lat Mall)  32 C 12 cm    2.5 <4.6 4.2 >3 Right Sup Peroneal Anti Sensory (Ant Lat Mall)  32 C 12 cm    2.8 <4.6 4.3 >3 Left Sural Anti Sensory (Lat Mall)  32 C Calf    2.7 <4.6 5.2 >3 Right Sural Anti Sensory (Lat Mall)  32 C Calf    2.8 <4.6 5.6 >3  Stim Site NR Onset (ms) Norm Onset (ms) O-P Amp (mV) Norm O-P Amp Site1 Site2 Delta-0 (ms) Dist (cm) Vel (m/s) Norm Vel (m/s) Left Peroneal Motor (Ext Dig Brev)  32 C Ankle    5.8 <6.0 *2.4 >2.5 B Fib Ankle 8.1 33.0 41 >40 B Fib    13.9  2.4  Poplt B Fib 2.0 9.0 45 >40 Poplt    15.9  2.3        Right Peroneal Motor (Ext Dig Brev)  32 C Ankle    4.2 <6.0 *2.2 >2.5 B Fib Ankle 9.2 37.0 40 >40 B Fib    13.4  2.0  Poplt B Fib 2.2 9.0 41 >40 Poplt    15.6  1.9        Left Peroneal TA Motor (Tib Ant)  32 C Fib Head    3.1 <4.5 *2.2 >3 Poplit Fib Head 1.9 9.0 47 >40 Poplit    5.0 <5.7 1.9        Right Peroneal TA Motor (Tib Ant)  32 C Fib Head    2.8 <4.5 *2.4 >3 Poplit Fib Head 1.5 9.0 60 >40 Poplit    4.3 <5.7 2.3        Left Tibial Motor (Abd Hall Brev)  32 C Ankle    5.9 <6.0 6.1 >4 Knee Ankle 9.6 39.0 41 >40 Knee    15.5  4.1        Right Tibial Motor (Abd Hall Brev)  32 C Ankle    5.5 <6.0 *1.5 >4 Knee Ankle 11.1 44.0 40 >40 Knee    16.6  1.5        Electromyography  Side Muscle Ins.Act Fibs Fasc Recrt Amp Dur Poly Activation Comment Right AntTibialis Nml Nml Nml Nml Nml Nml Nml Nml N/A Right Gastroc Nml Nml Nml *1- *1+ *1+ *1+ Nml N/A Right RectFemoris Nml Nml Nml *Early *1- *1+ *1+ Nml N/A Right BicepsFemS Nml Nml Nml *1- *1+ *1+ *1+ Nml N/A Right GluteusMed Nml *1+ Nml *Early *1- *1+ *1+  Nml N/A Right Iliacus Nml Nml Nml *Early *1- *1+ *1+ Nml N/A Right Lumbo Parasp Low Nml *2+ Nml *Early *1- *1+ *1+ Nml N/A Left AntTibialis Nml Nml Nml Nml Nml Nml Nml Nml N/A Left Gastroc Nml Nml Nml Nml  Nml Nml Nml Nml N/A Left BicepsFemS Nml Nml Nml *Early *1- *1+ *1+ Nml N/A Left VastusLat Nml *1+ Nml *Early *1- *1+ *1+ Nml N/A Left GluteusMed Nml *1+ Nml *Early *1- *1+ *1+ Nml N/A Waveforms:                        Recent Labs: Lab Results  Component Value Date   WBC 9.0 03/23/2022   HGB 13.4 03/23/2022   PLT 296 03/23/2022   NA 139 04/08/2022   K 4.3 04/08/2022   CL 96 04/08/2022   CO2 31 04/08/2022   GLUCOSE 105 (H) 04/08/2022   BUN 39 (H) 04/08/2022   CREATININE 1.02 04/08/2022   BILITOT 0.8 04/08/2022   ALKPHOS 57 04/08/2022   AST 199 (H) 04/08/2022   ALT 220 (H) 04/08/2022   PROT 6.0 04/08/2022   ALBUMIN 3.7 04/08/2022   CALCIUM 9.3 04/08/2022   GFRAA >90 05/04/2011    Speciality Comments: No specialty comments available.  Procedures:  No procedures performed Allergies: Patient has no known allergies.   Assessment / Plan:     Visit Diagnoses: No diagnosis found.  ***  Orders: No orders of the defined types were placed in this encounter.  No orders of the defined types were placed in this encounter.    Follow-Up Instructions: No follow-ups on file.   Collier Salina, MD  Note - This record has been created using [employer].  Chart creation errors have been sought, but may not always  have been located. Such creation errors do not reflect on  the standard of medical care.

## 2022-05-05 ENCOUNTER — Encounter: Payer: Self-pay | Admitting: Internal Medicine

## 2022-05-05 ENCOUNTER — Ambulatory Visit: Payer: PPO | Attending: Internal Medicine | Admitting: Internal Medicine

## 2022-05-05 VITALS — BP 126/71 | HR 72 | Resp 16 | Ht 70.0 in | Wt 194.0 lb

## 2022-05-05 DIAGNOSIS — M6089 Other myositis, multiple sites: Secondary | ICD-10-CM | POA: Diagnosis not present

## 2022-05-05 DIAGNOSIS — Z79899 Other long term (current) drug therapy: Secondary | ICD-10-CM

## 2022-05-05 DIAGNOSIS — R600 Localized edema: Secondary | ICD-10-CM | POA: Diagnosis not present

## 2022-05-06 ENCOUNTER — Telehealth: Payer: Self-pay | Admitting: *Deleted

## 2022-05-06 LAB — CBC WITH DIFFERENTIAL/PLATELET
Absolute Monocytes: 473 cells/uL (ref 200–950)
Basophils Absolute: 55 cells/uL (ref 0–200)
Basophils Relative: 0.5 %
Eosinophils Absolute: 22 cells/uL (ref 15–500)
Eosinophils Relative: 0.2 %
HCT: 43.3 % (ref 38.5–50.0)
Hemoglobin: 14.1 g/dL (ref 13.2–17.1)
Lymphs Abs: 825 cells/uL — ABNORMAL LOW (ref 850–3900)
MCH: 30.1 pg (ref 27.0–33.0)
MCHC: 32.6 g/dL (ref 32.0–36.0)
MCV: 92.3 fL (ref 80.0–100.0)
MPV: 10.7 fL (ref 7.5–12.5)
Monocytes Relative: 4.3 %
Neutro Abs: 9625 cells/uL — ABNORMAL HIGH (ref 1500–7800)
Neutrophils Relative %: 87.5 %
Platelets: 273 10*3/uL (ref 140–400)
RBC: 4.69 10*6/uL (ref 4.20–5.80)
RDW: 13.9 % (ref 11.0–15.0)
Total Lymphocyte: 7.5 %
WBC: 11 10*3/uL — ABNORMAL HIGH (ref 3.8–10.8)

## 2022-05-06 LAB — COMPLETE METABOLIC PANEL WITH GFR
AG Ratio: 1.8 (calc) (ref 1.0–2.5)
ALT: 125 U/L — ABNORMAL HIGH (ref 9–46)
AST: 98 U/L — ABNORMAL HIGH (ref 10–35)
Albumin: 3.7 g/dL (ref 3.6–5.1)
Alkaline phosphatase (APISO): 63 U/L (ref 35–144)
BUN: 24 mg/dL (ref 7–25)
CO2: 31 mmol/L (ref 20–32)
Calcium: 9 mg/dL (ref 8.6–10.3)
Chloride: 101 mmol/L (ref 98–110)
Creat: 0.83 mg/dL (ref 0.70–1.28)
Globulin: 2.1 g/dL (calc) (ref 1.9–3.7)
Glucose, Bld: 137 mg/dL — ABNORMAL HIGH (ref 65–99)
Potassium: 4.6 mmol/L (ref 3.5–5.3)
Sodium: 141 mmol/L (ref 135–146)
Total Bilirubin: 0.4 mg/dL (ref 0.2–1.2)
Total Protein: 5.8 g/dL — ABNORMAL LOW (ref 6.1–8.1)
eGFR: 94 mL/min/{1.73_m2} (ref >=60)

## 2022-05-06 LAB — CK: Total CK: 1900 U/L — ABNORMAL HIGH (ref 44–196)

## 2022-05-06 MED ORDER — PREDNISONE 20 MG PO TABS: 20.0000 mg | ORAL_TABLET | Freq: Two times a day (BID) | ORAL | 0 refills | Status: DC

## 2022-05-06 MED ORDER — AZATHIOPRINE 50 MG PO TABS: 150.0000 mg | ORAL_TABLET | Freq: Every day | ORAL | 0 refills | Status: DC

## 2022-05-06 NOTE — Telephone Encounter (Signed)
Sean [name] with [facility] contacted the office stating they received a prescription with 2 sets of directions. She states she would like to clarify which directions are correct. Advised decrease the prednisone to 20 mg twice daily.

## 2022-05-06 NOTE — Progress Notes (Signed)
CK level is 1900 down from 4387 last month. White blood cell count is slightly high this is probably due to prednisone. The liver enzyme markers are partially improved this is going along with the CK.  I recommend he can decrease the prednisone to 20 mg twice daily and continue going up to the 3 tablets of azathioprine as planned. Still stay on the antibiotic for now.

## 2022-05-06 NOTE — Addendum Note (Signed)
Addended by: Collier Salina on: 05/06/2022 08:03 AM   Modules accepted: Orders

## 2022-05-12 DIAGNOSIS — Z9181 History of falling: Secondary | ICD-10-CM | POA: Diagnosis not present

## 2022-05-12 DIAGNOSIS — I2510 Atherosclerotic heart disease of native coronary artery without angina pectoris: Secondary | ICD-10-CM | POA: Diagnosis not present

## 2022-05-12 DIAGNOSIS — E039 Hypothyroidism, unspecified: Secondary | ICD-10-CM | POA: Diagnosis not present

## 2022-05-12 DIAGNOSIS — Z79899 Other long term (current) drug therapy: Secondary | ICD-10-CM | POA: Diagnosis not present

## 2022-05-12 DIAGNOSIS — Z87891 Personal history of nicotine dependence: Secondary | ICD-10-CM | POA: Diagnosis not present

## 2022-05-12 DIAGNOSIS — I10 Essential (primary) hypertension: Secondary | ICD-10-CM | POA: Diagnosis not present

## 2022-05-12 DIAGNOSIS — E785 Hyperlipidemia, unspecified: Secondary | ICD-10-CM | POA: Diagnosis not present

## 2022-05-12 DIAGNOSIS — Z8711 Personal history of peptic ulcer disease: Secondary | ICD-10-CM | POA: Diagnosis not present

## 2022-05-12 DIAGNOSIS — Z7952 Long term (current) use of systemic steroids: Secondary | ICD-10-CM | POA: Diagnosis not present

## 2022-05-12 DIAGNOSIS — Z7902 Long term (current) use of antithrombotics/antiplatelets: Secondary | ICD-10-CM | POA: Diagnosis not present

## 2022-05-12 DIAGNOSIS — M609 Myositis, unspecified: Secondary | ICD-10-CM | POA: Diagnosis not present

## 2022-05-12 LAB — ANTI-HMGCR AB IGG: EGFR: 97

## 2022-05-12 LAB — DISCHARGE PATIENT: EGFR: 97

## 2022-05-22 ENCOUNTER — Telehealth: Payer: Self-pay | Admitting: Pulmonary Disease

## 2022-05-22 NOTE — Telephone Encounter (Signed)
PT had a PFT order in on a Dr. Tonia Brooms visit. One was not sched. And he later saw a NP for a different follow up. He has an appt on 4/29. Does Dr. Tonia Brooms want a PFT before that appt.? Thank you.

## 2022-05-26 NOTE — Telephone Encounter (Signed)
Dr. Tonia Brooms, please advise if you need pt to have a PFT prior to appt. No openings prior to his appt with you on 4/29 at [facility].

## 2022-05-27 NOTE — Telephone Encounter (Signed)
Called and spoke with patient, scheduled him for a PFT at [facility] [place] on 06/04/22 at 11:30 am, to arrive by 11:15 am for check in.  He stated he knew where this was located.  Verified mailing address and advised patient that I was mailing him instructions of do's and don'ts prior to the PFT.  He verbalized understanding.  Nothing further needed.

## 2022-05-27 NOTE — Telephone Encounter (Signed)
Pt has appt for PFT on 4/18 and is aware it's at [facility]. Nothing further needed.

## 2022-06-03 ENCOUNTER — Ambulatory Visit: Payer: PPO | Attending: Internal Medicine | Admitting: Internal Medicine

## 2022-06-03 ENCOUNTER — Encounter: Payer: Self-pay | Admitting: Internal Medicine

## 2022-06-03 VITALS — BP 120/68 | HR 65 | Resp 14 | Ht 70.0 in | Wt 192.0 lb

## 2022-06-03 DIAGNOSIS — M6089 Other myositis, multiple sites: Secondary | ICD-10-CM | POA: Diagnosis not present

## 2022-06-03 DIAGNOSIS — Z7952 Long term (current) use of systemic steroids: Secondary | ICD-10-CM

## 2022-06-03 DIAGNOSIS — Z79899 Other long term (current) drug therapy: Secondary | ICD-10-CM

## 2022-06-03 NOTE — Progress Notes (Unsigned)
Office Visit Note  Patient: Sean [name]             Date of Birth: 06/22/1950           MRN: [id number]             PCP: [name], MD Referring: [name], MD Visit Date: 06/03/2022   Subjective:  Follow-up (Patient would like to know if he can get the Covid booster. Patient states cannot pinpoint swelling.)   History of Present Illness: OCTAVION MOLLENKOPF is a 72 y.o. 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  Since her last visit he is continue to stay active and working on his mobility with some additional partial improvement.  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  Leg swelling remains mostly improved.   Previous HPI 05/05/22 CRAIG IONESCU is a 72 y.o. male here for follow up for myositis currently on azathioprine titrating dose now at 100 mg daily and prednisone 60 mg daily.  Since last visit he had EMG testing on March 14 with Dr. 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  He is tolerating the medication okay without major issue.  He is working with physical therapy focusing pretty extensively on his walking and also is doing a lot of shoulder mobility exercises.  He is seen partial improvement in his strength and stiffness with these exercises.  Most recently had a physical therapy session earlier today and has some muscle soreness.  He is also had more frequent nosebleeds reported particularly with bending forward position.  In the past week the frequency decreased he thinks this is from stopping sinus irrigation and applying Vaseline regularly.   Previous HPI 04/13/22 CARLESS SLATTEN is a 72 y.o. male here for follow up for myositis after initial visit almost 3 weeks ago he continues on prednisone 60 mg daily.  He started working with  physical therapy and has noticed very mild improvements in strength for example is able to brace his foot up 1 step at a time unaided on the right side.  No interval worsening of strength elsewhere no swallowing or respiratory difficulties.  That follow-up with Dr. Kateri Plummer last week he had repeat lab test with CK more elevated again to 4,387 up from 1,164 at his hospital discharge.  BNP was normal at 98. He was taking Lasix at increased dose to daily for edema with some benefit but continues to have swelling in bilateral hands and legs.  Myositis antibody panel has resulted with positive for 52 kDa SSA antibody.  Muscle biopsy result was pretty nonspecific.   03/23/22 Muscle Biopsy A. MUSCLE, LEFT THIGH, BIOPSY:  - Mildly abnormal muscle showing regenerative fibrosis.  Presence of COX negative fibrous could be age-related, but the presence of regenerating fibers could be in agreement with the diagnosis of myositis; however, there are no inflammatory infiltrates.  Clinical correlation is suggested.   Previous HPI 03/26/22 PONCIANO SHEALY is a 72 y.o. male here for myositis after recent hospital discharge.  He has experienced progressive weakening in proximal muscles as well as increased peripheral edema ongoing for months leading up to this evaluation.  Increase in his swelling and some dyspnea on exertion prompting cardiology evaluation in October and due to abnormal liver function test he  was discontinued off of rosuvastatin at that time.  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  Over the next few months until January he experienced ongoing unintentional weight loss down about 20 pounds and worsening exertion intolerance difficulty with mobility and noticed progressive hunching over of his shoulders with height loss.  He saw gastroenterology due to the unintentional weight loss and abnormal  LFTs with CT scan of the abdomen and pelvis with contrast was entirely normal besides prostate hypertrophy.  He eventually had updated echocardiogram and cardiac PET CT scan for evaluation showing normal function no abnormal metabolic uptake in the associated chest CT was negative for abnormal findings.  He saw Dr. Lenise Herald in pulmonology clinic due to his dyspnea despite normal cardiac workup where he was noted to have some myalgias and proximal weakness as well as apparent Raynaud's symptoms with pallor in several digits prompted concern for connective tissue disease.  Laboratory testing showed highly elevated CK 6777 and aldolase 83.1 and highly positive ANA antibody titer 1:1280.  He was admitted to the hospital and started on high-dose steroids to expedite muscle biopsy with sampling from the left vastus lateralis collected on February 5. He is currently taking prednisone 60 mg daily with plan to taper down by 20 mg/day dose each month and also has plans to follow-up with Dr. Allena Katz for EMG study.  Remains quite weak compared to his baseline that he is able to walk without assistance just only limited to short distances.  He is noticing some dyspnea and nonproductive cough especially at nighttime when lying flat.  Denies any significant dysphagia.  He is not seeing any of the ongoing discoloration occurring in his fingers and toes.  No new skin rashes.   Review of Systems  Constitutional:  Negative for fatigue.  HENT:  Negative for mouth sores and mouth dryness.   Eyes:  Negative for dryness.  Respiratory:  Negative for shortness of breath.   Cardiovascular:  Negative for chest pain and palpitations.  Gastrointestinal:  Negative for blood in stool, constipation and diarrhea.  Endocrine: Negative for increased urination.  Genitourinary:  Negative for involuntary urination.  Musculoskeletal:  Negative for joint pain, gait problem, joint pain, joint swelling, myalgias, muscle weakness, morning  stiffness, muscle tenderness and myalgias.  Skin:  Negative for color change, rash, hair loss and sensitivity to sunlight.  Allergic/Immunologic: Negative for susceptible to infections.  Neurological:  Negative for dizziness and headaches.  Hematological:  Negative for swollen glands.  Psychiatric/Behavioral:  Negative for depressed mood and sleep disturbance. The patient is not nervous/anxious.     PMFS History:  Patient Active Problem List   Diagnosis Date Noted   High risk medication use 05/05/2022   Swelling 04/13/2022   Long term (current) use of systemic steroids 03/26/2022   Pedal edema 03/26/2022   Myositis 03/18/2022   Alcohol addiction 03/18/2022   Hypothyroidism 03/18/2022   Elevated transaminase level 03/18/2022   RBBB 10/24/2015   Coronary artery disease    Tachycardia 05/09/2011   Abnormal liver enzymes 05/06/2011   HTN (hypertension) 04/29/2011   HLD (hyperlipidemia) 04/29/2011    Past Medical History:  Diagnosis Date   Alcohol addiction    2010- admitted for withdrawal , detox admission after that   Arrhythmia    Coronary artery disease    95% mid LAD s/p BMS of LAD with aneurysmal segment  Depression    ED (erectile dysfunction)    HLD (hyperlipidemia)    HTN (hypertension)    Mental disorder    Peptic ulcer    RBBB 10/24/2015    Family History  Problem Relation Age of Onset   Prostate cancer Father    Heart attack Brother 58   Anesthesia problems Neg Hx    Hypotension Neg Hx    Malignant hyperthermia Neg Hx    Pseudochol deficiency Neg Hx    Colon cancer Neg Hx    Stomach cancer Neg Hx    Esophageal cancer Neg Hx    Colon polyps Neg Hx    Past Surgical History:  Procedure Laterality Date   CORONARY STENT PLACEMENT     MUSCLE BIOPSY Left 03/23/2022   Procedure: LEFT THIGH MUSCLE BIOPSY;  Surgeon: Diamantina Monks, MD;  Location: [facility] OR;  Service: General;  Laterality: Left;   Social History   Social History Narrative   Live with wife in  [place], is an Art gallery manager, currently driving trucks for a local firm. Quit smoking 1990 after smoking for 20 years. Drinks heavilyu (1 bottle of vodka or 18-24 beers a day). Used to smoke marijuana but quit in 1990s. Has united healthcare insurance. Goes to [employer] for alcohol cessation   Immunization History  Administered Date(s) Administered   Fluad Quad(high Dose 65+) 10/20/2021   PFIZER(Purple Top)SARS-COV-2 Vaccination 04/01/2019     Objective: Vital Signs: BP 120/68 (BP Location: Left Arm, Patient Position: Sitting, Cuff Size: Normal)   Pulse 65   Resp 14   Ht  (1.778 m)   Wt 192 lb (87.1 kg)   BMI 27.55 kg/m    Physical Exam Cardiovascular:     Rate and Rhythm: Normal rate and regular rhythm.  Pulmonary:     Effort: Pulmonary effort is normal.     Comments: Bibasilar inspiratory crackles Musculoskeletal:     Right lower leg: No edema.     Left lower leg: No edema.  Lymphadenopathy:     Cervical: No cervical adenopathy.  Skin:    Findings: No rash.  Neurological:     Comments: Able to stand without use of arms but requires significant rocking motion Hip flexor strength 3/5 bilaterally otherwise 5/5 throughout      Musculoskeletal Exam:  Shoulders full ROM no tenderness or swelling Elbows full ROM no tenderness or swelling Wrists full ROM no tenderness or swelling Fingers full ROM no tenderness or swelling Knees full ROM no tenderness or swelling   Investigation: No additional findings.  Imaging: No results found.  Recent Labs: Lab Results  Component Value Date   WBC 9.3 06/03/2022   HGB 14.2 06/03/2022   PLT 294 06/03/2022   NA 141 05/05/2022   K 4.6 05/05/2022   CL 101 05/05/2022   CO2 31 05/05/2022   GLUCOSE 137 (H) 05/05/2022   BUN 24 05/05/2022   CREATININE 0.83 05/05/2022   BILITOT 0.4 05/05/2022   ALKPHOS 57 04/08/2022   AST 98 (H) 05/05/2022   ALT 125 (H) 05/05/2022   PROT 5.8 (L) 05/05/2022   ALBUMIN 3.7 04/08/2022    CALCIUM 9.0 05/05/2022   GFRAA >90 05/04/2011    Speciality Comments: No specialty comments available.  Procedures:  No procedures performed Allergies: Patient has no known allergies.   Assessment / Plan:     Visit Diagnoses: Other myositis of multiple sites - Plan: CK  Checking CK to monitor disease activity.  No new red flag symptoms such  as significant dysphagia or worsening shortness of breath.  Assuming CK has trended to further improvement neck step would be to taper down the prednisone dose.  If this is worse would need to consider alternate treatment regimen to the azathioprine.  Otherwise we will plan to continue azathioprine 150 mg daily prednisone tapering currently from 20 mg twice daily and continue Bactrim prophylaxis every other day.  High risk medication use - Plan: CBC with Differential/Platelet, COMPLETE METABOLIC PANEL WITH GFR  Checking CBC and CMP for medication monitoring on increased dose of azathioprine also continuing on low-dose Bactrim prophylaxis and the glucocorticoids.  Not had any significant medication intolerance.  Long term (current) use of systemic steroids  Continuing pneumonia antibiotic prophylaxis until prednisone dose gets to 20 mg daily or less.  Recommended against getting additional COVID booster vaccinations at this time until 1 autoimmune disease is under control and also while on steroids would likely be limited efficacy.  Orders: Orders Placed This Encounter  Procedures   CK   CBC with Differential/Platelet   COMPLETE METABOLIC PANEL WITH GFR   No orders of the defined types were placed in this encounter.    Follow-Up Instructions: Return in about 6 weeks (around 07/15/2022) for Myositis on AZA/GC f/u 6wks.   Fuller Plan, MD  Note - This record has been created using [employer].  Chart creation errors have been sought, but may not always  have been located. Such creation errors do not reflect on  the standard of  medical care.

## 2022-06-04 ENCOUNTER — Ambulatory Visit (INDEPENDENT_AMBULATORY_CARE_PROVIDER_SITE_OTHER): Payer: PPO | Admitting: Pulmonary Disease

## 2022-06-04 DIAGNOSIS — R0609 Other forms of dyspnea: Secondary | ICD-10-CM

## 2022-06-04 LAB — CBC WITH DIFFERENTIAL/PLATELET
Absolute Monocytes: 716 cells/uL (ref 200–950)
Basophils Absolute: 28 cells/uL (ref 0–200)
Basophils Relative: 0.3 %
Eosinophils Absolute: 9 cells/uL — ABNORMAL LOW (ref 15–500)
Eosinophils Relative: 0.1 %
HCT: 42.9 % (ref 38.5–50.0)
Hemoglobin: 14.2 g/dL (ref 13.2–17.1)
Lymphs Abs: 930 cells/uL (ref 850–3900)
MCH: 31.6 pg (ref 27.0–33.0)
MCHC: 33.1 g/dL (ref 32.0–36.0)
MCV: 95.3 fL (ref 80.0–100.0)
MPV: 10.1 fL (ref 7.5–12.5)
Monocytes Relative: 7.7 %
Neutro Abs: 7617 cells/uL (ref 1500–7800)
Neutrophils Relative %: 81.9 %
Platelets: 294 10*3/uL (ref 140–400)
RBC: 4.5 10*6/uL (ref 4.20–5.80)
RDW: 15.5 % — ABNORMAL HIGH (ref 11.0–15.0)
Total Lymphocyte: 10 %
WBC: 9.3 10*3/uL (ref 3.8–10.8)

## 2022-06-04 LAB — PULMONARY FUNCTION TEST
DL/VA % pred: 123 %
DL/VA: 4.96 ml/min/mmHg/L
DLCO cor % pred: 86 %
DLCO cor: 22.28 ml/min/mmHg
DLCO unc % pred: 85 %
DLCO unc: 21.96 ml/min/mmHg
FEF 25-75 Post: 2.72 L/sec
FEF 25-75 Pre: 2.43 L/sec
FEF2575-%Change-Post: 11 %
FEF2575-%Pred-Post: 112 %
FEF2575-%Pred-Pre: 100 %
FEV1-%Change-Post: 1 %
FEV1-%Pred-Post: 76 %
FEV1-%Pred-Pre: 75 %
FEV1-Post: 2.47 L
FEV1-Pre: 2.43 L
FEV1FVC-%Change-Post: 3 %
FEV1FVC-%Pred-Pre: 110 %
FEV6-%Change-Post: -1 %
FEV6-%Pred-Post: 71 %
FEV6-%Pred-Pre: 72 %
FEV6-Post: 2.95 L
FEV6-Pre: 3 L
FEV6FVC-%Pred-Post: 106 %
FEV6FVC-%Pred-Pre: 106 %
FVC-%Change-Post: -1 %
FVC-%Pred-Post: 67 %
FVC-%Pred-Pre: 68 %
FVC-Post: 2.95 L
FVC-Pre: 3 L
Post FEV1/FVC ratio: 84 %
Post FEV6/FVC ratio: 100 %
Pre FEV1/FVC ratio: 81 %
Pre FEV6/FVC Ratio: 100 %
RV % pred: 86 %
RV: 2.13 L
TLC % pred: 74 %
TLC: 5.25 L

## 2022-06-04 LAB — CK: Total CK: 1452 U/L — ABNORMAL HIGH (ref 44–196)

## 2022-06-04 LAB — COMPLETE METABOLIC PANEL WITH GFR
AG Ratio: 2 (calc) (ref 1.0–2.5)
ALT: 124 U/L — ABNORMAL HIGH (ref 9–46)
AST: 94 U/L — ABNORMAL HIGH (ref 10–35)
Albumin: 4.1 g/dL (ref 3.6–5.1)
Alkaline phosphatase (APISO): 61 U/L (ref 35–144)
BUN: 24 mg/dL (ref 7–25)
CO2: 31 mmol/L (ref 20–32)
Calcium: 9.1 mg/dL (ref 8.6–10.3)
Chloride: 98 mmol/L (ref 98–110)
Creat: 0.79 mg/dL (ref 0.70–1.28)
Globulin: 2.1 g/dL (calc) (ref 1.9–3.7)
Glucose, Bld: 119 mg/dL — ABNORMAL HIGH (ref 65–99)
Potassium: 4.2 mmol/L (ref 3.5–5.3)
Sodium: 137 mmol/L (ref 135–146)
Total Bilirubin: 0.6 mg/dL (ref 0.2–1.2)
Total Protein: 6.2 g/dL (ref 6.1–8.1)
eGFR: 95 mL/min/{1.73_m2} (ref >=60)

## 2022-06-04 MED ORDER — PREDNISONE 20 MG PO TABS
ORAL_TABLET | ORAL | 0 refills | Status: DC
Start: 2022-06-04 — End: 2022-06-09

## 2022-06-04 MED ORDER — SULFAMETHOXAZOLE-TRIMETHOPRIM 400-80 MG PO TABS
1.0000 | ORAL_TABLET | ORAL | 0 refills | Status: DC
Start: 2022-06-05 — End: 2022-07-16

## 2022-06-04 NOTE — Progress Notes (Signed)
Full PFT Performed Today  

## 2022-06-04 NOTE — Addendum Note (Signed)
Addended by: Fuller Plan on: 06/04/2022 07:53 AM   Modules accepted: Orders

## 2022-06-04 NOTE — Progress Notes (Signed)
CK is down to 1452 from 1900. Blood count and metabolic panel look okay. He can continue tapering the prednisone dose decrease to 30 mg daily for 1 month then 20 mg daily. One he is down to 20 mg he can discontinue the antibiotic.

## 2022-06-04 NOTE — Patient Instructions (Signed)
Full PFT Performed Today  

## 2022-06-08 ENCOUNTER — Telehealth: Payer: Self-pay

## 2022-06-08 NOTE — Telephone Encounter (Signed)
[facility] pharmacy called for clarification on prednisone rx that was sent on 06/04/2022. The quantity of tablets and duration is what they are asking for clarification on. Please advise.

## 2022-06-09 MED ORDER — PREDNISONE 20 MG PO TABS
ORAL_TABLET | ORAL | 0 refills | Status: AC
Start: 2022-06-09 — End: 2022-08-08

## 2022-06-09 NOTE — Addendum Note (Signed)
Addended by: Fuller Plan on: 06/09/2022 08:11 AM   Modules accepted: Orders

## 2022-06-09 NOTE — Telephone Encounter (Signed)
According to [facility], new rx was filled today and picked up by the patient.

## 2022-06-09 NOTE — Telephone Encounter (Signed)
I resent Rx corrected: 30 mg x 30 days 20 mg x 30 days #75 tablets total

## 2022-06-14 ENCOUNTER — Other Ambulatory Visit: Payer: Self-pay | Admitting: Internal Medicine

## 2022-06-14 DIAGNOSIS — M6089 Other myositis, multiple sites: Secondary | ICD-10-CM

## 2022-06-15 ENCOUNTER — Ambulatory Visit: Payer: PPO | Admitting: Pulmonary Disease

## 2022-06-15 ENCOUNTER — Encounter: Payer: Self-pay | Admitting: Pulmonary Disease

## 2022-06-15 VITALS — BP 110/70 | HR 98 | Ht 70.0 in | Wt 193.0 lb

## 2022-06-15 DIAGNOSIS — I7300 Raynaud's syndrome without gangrene: Secondary | ICD-10-CM | POA: Diagnosis not present

## 2022-06-15 DIAGNOSIS — M609 Myositis, unspecified: Secondary | ICD-10-CM | POA: Diagnosis not present

## 2022-06-15 NOTE — Telephone Encounter (Signed)
Last Fill: 05/06/2022  Labs: 06/03/2022 CK is down to 1452 from 1900. Blood count and metabolic panel look okay. He can continue tapering the prednisone dose decrease to 30 mg daily for 1 month then 20 mg daily. One he is down to 20 mg he can discontinue the antibiotic.   Next Visit: 07/16/2022  Last Visit: 06/03/2022  DX: Other myositis of multiple sites   Current Dose per office note 06/03/2022: azathioprine 150 mg daily   Okay to refill Imuran?

## 2022-06-15 NOTE — Patient Instructions (Addendum)
Thank you for visiting Dr. Tonia Brooms at [facility]. Today we recommend the following:  Orders Placed This Encounter  Procedures   CT CHEST HIGH RESOLUTION   Return in about 4 weeks (around 07/13/2022) for w/ Dr. Marchelle Gearing , after CT Chest in [facility] .    Please do your part to reduce the spread of COVID-19.

## 2022-06-15 NOTE — Progress Notes (Signed)
Synopsis: Referred in January 2024 for dyspnea on exertion by [name], MD  Subjective:   PATIENT ID: Sean [name] GENDER: male DOB: April 11, 1950, MRN: [id number]  Chief Complaint  Patient presents with   Follow-up    3 mos f/up    This is a 72 year old gentleman, history of alcohol abuse, coronary artery disease, depression, hypertension, hyperlipidemia.  Patient is a former smoker quit in 19 90, 34-pack-year history. He trys to walk a lot. He has dogs that he walks regularly. Over the past 4 months he has had increased sob and fatigue. Distances and inclines make walking worse. He saw cardiology and they did several studies and everything seemed fine. He was sent to GI for evaluation for elevated LFTs, still drinking alcohol daily.  He feels like he is becoming weaker and weaker over the past couple of months.  He is finding that he is having pains in his muscles and when he describes this he points to locations in large muscle groups and side of the thigh as well as shoulders.  He is also noticed ongoing weight loss.  He also has recurrent episodes of Raynaud's of the hands.  OV 03/18/2022: Here today for evaluation of recent abnormal labs.  His CK is elevated, aldolase elevated, ANA titer was positive.  For some reason his myositis antibody panel was canceled.  OV 06/15/2022: Doing well today.  He feels so much better.  He is stronger.  Tolerating his Imuran plus prednisone.  He had pulmonary function test completed which reviewed today in the office which shows a reduced FVC.  His DLCO however is normal.  He does have some crackles on exam has not had any axial CT imaging.  Still feels dyspneic when he exerts himself.       Past Medical History:  Diagnosis Date   Alcohol addiction (HCC)    2010- admitted for withdrawal , detox admission after that   Arrhythmia    Coronary artery disease    95% mid LAD s/p BMS of LAD with aneurysmal segment   Depression    ED (erectile  dysfunction)    HLD (hyperlipidemia)    HTN (hypertension)    Mental disorder    Peptic ulcer    RBBB 10/24/2015     Family History  Problem Relation Age of Onset   Prostate cancer Father    Heart attack Brother 9   Anesthesia problems Neg Hx    Hypotension Neg Hx    Malignant hyperthermia Neg Hx    Pseudochol deficiency Neg Hx    Colon cancer Neg Hx    Stomach cancer Neg Hx    Esophageal cancer Neg Hx    Colon polyps Neg Hx      Past Surgical History:  Procedure Laterality Date   CORONARY STENT PLACEMENT     MUSCLE BIOPSY Left 03/23/2022   Procedure: LEFT THIGH MUSCLE BIOPSY;  Surgeon: Diamantina Monks, MD;  Location: [facility] OR;  Service: General;  Laterality: Left;    Social History   Socioeconomic History   Marital status: Married    Spouse name: Not on file   Number of children: 2   Years of education: Not on file   Highest education level: Not on file  Occupational History   Occupation: Retired bus driver  Tobacco Use   Smoking status: Former    Packs/day: 1.00    Years: 30.00    Additional pack years: 0.00    Total pack years: 30.00  Types: Cigarettes    Quit date: 32    Years since quitting: 34.3    Passive exposure: Past   Smokeless tobacco: Never  Vaping Use   Vaping Use: Never used  Substance and Sexual Activity   Alcohol use: Yes    Alcohol/week: 14.0 standard drinks of alcohol    Types: 14 Cans of beer per week   Drug use: No   Sexual activity: Yes    Birth control/protection: None    Comment: Married  Other Topics Concern   Not on file  Social History Narrative   Live with wife in [place], is an Art gallery manager, currently driving trucks for a local firm. Quit smoking 1990 after smoking for 20 years. Drinks heavilyu (1 bottle of vodka or 18-24 beers a day). Used to smoke marijuana but quit in 1990s. Has united [employer] insurance. Goes to [employer] for alcohol cessation   Social Determinants of Health   Financial Resource Strain: Not on  file  Food Insecurity: No Food Insecurity (03/18/2022)   Hunger Vital Sign    Worried About Running Out of Food in the Last Year: Never true    Ran Out of Food in the Last Year: Never true  Transportation Needs: No Transportation Needs (03/18/2022)   PRAPARE - Administrator, Civil Service (Medical): No    Lack of Transportation (Non-Medical): No  Physical Activity: Not on file  Stress: Not on file  Social Connections: Not on file  Intimate Partner Violence: Not At Risk (03/18/2022)   Humiliation, Afraid, Rape, and Kick questionnaire    Fear of Current or Ex-Partner: No    Emotionally Abused: No    Physically Abused: No    Sexually Abused: No     No Known Allergies   Outpatient Medications Prior to Visit  Medication Sig Dispense Refill   azaTHIOprine (IMURAN) 50 MG tablet Take 3 tablets (150 mg total) by mouth daily. 90 tablet 0   clopidogrel (PLAVIX) 75 MG tablet Take 1 tablet (75 mg total) by mouth daily. 90 tablet 3   Evolocumab (REPATHA SURECLICK) 140 MG/ML SOAJ Inject 140 mg into the skin every 14 (fourteen) days. 2 mL 11   furosemide (LASIX) 20 MG tablet Take 1 tablet (20 mg total) by mouth as needed for fluid or edema. (Patient taking differently: Take 20 mg by mouth daily as needed for fluid or edema.) 90 tablet 3   levothyroxine (SYNTHROID) 25 MCG tablet Take 25 mcg by mouth daily in the afternoon.     lisinopril (ZESTRIL) 20 MG tablet Take 1 tablet (20 mg total) by mouth daily. 90 tablet 3   MELATONIN PO Take 1 tablet by mouth at bedtime as needed (sleep).     metoprolol succinate (TOPROL-XL) 50 MG 24 hr tablet Take 1 tablet (50 mg total) by mouth every evening. Take with or immediately following a meal. (Patient taking differently: Take 50 mg by mouth every evening.) 90 tablet 3   Multiple Vitamin (MULITIVITAMIN WITH MINERALS) TABS Take 1 tablet by mouth daily.     nitroGLYCERIN (NITROSTAT) 0.4 MG SL tablet Place 1 tablet (0.4 mg total) under the tongue every 5  (five) minutes as needed for chest pain. 25 tablet 3   potassium chloride (KLOR-CON M) 10 MEQ tablet Take 1 tablet (10 mEq total) by mouth daily. 90 tablet 3   predniSONE (DELTASONE) 20 MG tablet Take 1.5 tablets (30 mg total) by mouth daily with breakfast for 30 days, THEN 1 tablet (20 mg total)  daily with breakfast. 75 tablet 0   sulfamethoxazole-trimethoprim (BACTRIM) 400-80 MG tablet Take 1 tablet by mouth 3 (three) times a week. 15 tablet 0   pantoprazole (PROTONIX) 40 MG tablet Take 1 tablet (40 mg total) by mouth daily. 30 tablet 0   No facility-administered medications prior to visit.    Review of Systems  Constitutional:  Negative for chills, fever, malaise/fatigue and weight loss.  HENT:  Negative for hearing loss, sore throat and tinnitus.   Eyes:  Negative for blurred vision and double vision.  Respiratory:  Negative for cough, hemoptysis, sputum production, shortness of breath, wheezing and stridor.   Cardiovascular:  Negative for chest pain, palpitations, orthopnea, leg swelling and PND.  Gastrointestinal:  Negative for abdominal pain, constipation, diarrhea, heartburn, nausea and vomiting.  Genitourinary:  Negative for dysuria, hematuria and urgency.  Musculoskeletal:  Negative for joint pain and myalgias.  Skin:  Negative for itching and rash.  Neurological:  Negative for dizziness, tingling, weakness and headaches.  Endo/Heme/Allergies:  Negative for environmental allergies. Does not bruise/bleed easily.  Psychiatric/Behavioral:  Negative for depression. The patient is not nervous/anxious and does not have insomnia.   All other systems reviewed and are negative.    Objective:  Physical Exam Vitals reviewed.  Constitutional:      General: He is not in acute distress.    Appearance: He is well-developed.  HENT:     Head: Normocephalic and atraumatic.  Eyes:     General: No scleral icterus.    Conjunctiva/sclera: Conjunctivae normal.     Pupils: Pupils are equal,  round, and reactive to light.  Neck:     Vascular: No JVD.     Trachea: No tracheal deviation.  Cardiovascular:     Rate and Rhythm: Normal rate and regular rhythm.     Heart sounds: Normal heart sounds. No murmur heard. Pulmonary:     Effort: No tachypnea, accessory muscle usage or respiratory distress.     Breath sounds: No stridor. No wheezing, rhonchi or rales.     Comments: Bilateral inspiratory crackles Abdominal:     General: There is no distension.     Palpations: Abdomen is soft.     Tenderness: There is no abdominal tenderness.  Musculoskeletal:        General: No tenderness.     Cervical back: Neck supple.     Right lower leg: No edema.     Left lower leg: No edema.  Lymphadenopathy:     Cervical: No cervical adenopathy.  Skin:    General: Skin is warm and dry.     Capillary Refill: Capillary refill takes less than 2 seconds.     Findings: No rash.  Neurological:     Mental Status: He is alert and oriented to person, place, and time.  Psychiatric:        Behavior: Behavior normal.      Vitals:   06/15/22 1046  BP: 110/70  Pulse: 98  SpO2: 100%  Weight: 193 lb (87.5 kg)  Height: 5\' 10"  (1.778 m)   100% on RA BMI Readings from Last 3 Encounters:  06/15/22 27.69 kg/m  06/03/22 27.55 kg/m  05/05/22 27.84 kg/m   Wt Readings from Last 3 Encounters:  06/15/22 193 lb (87.5 kg)  06/03/22 192 lb (87.1 kg)  05/05/22 194 lb (88 kg)     CBC    Component Value Date/Time   WBC 9.3 06/03/2022 1141   RBC 4.50 06/03/2022 1141   HGB 14.2 06/03/2022 1141  HCT 42.9 06/03/2022 1141   PLT 294 06/03/2022 1141   MCV 95.3 06/03/2022 1141   MCH 31.6 06/03/2022 1141   MCHC 33.1 06/03/2022 1141   RDW 15.5 (H) 06/03/2022 1141   LYMPHSABS 930 06/03/2022 1141   MONOABS 0.6 03/23/2022 0346   EOSABS 9 (L) 06/03/2022 1141   BASOSABS 28 06/03/2022 1141     Chest Imaging:  02/11/2022: CT abdomen pelvis enlarged prostate.  Lung fields appear clear. The patient's  images have been independently reviewed by me.     Pulmonary Functions Testing Results:    Latest Ref Rng & Units 06/04/2022   11:25 AM  PFT Results  FVC-Pre L 3.00  P  FVC-Predicted Pre % 68  P  FVC-Post L 2.95  P  FVC-Predicted Post % 67  P  Pre FEV1/FVC % % 81  P  Post FEV1/FCV % % 84  P  FEV1-Pre L 2.43  P  FEV1-Predicted Pre % 75  P  FEV1-Post L 2.47  P  DLCO uncorrected ml/min/mmHg 21.96  P  DLCO UNC% % 85  P  DLCO corrected ml/min/mmHg 22.28  P  DLCO COR %Predicted % 86  P  DLVA Predicted % 123  P  TLC L 5.25  P  TLC % Predicted % 74  P  RV % Predicted % 86  P    P Preliminary result    FeNO:   Pathology:   Echocardiogram:   Nuclear medicine PET/CT perfusion study   The study is normal. The study is low risk.   LV perfusion is normal. There is no evidence of ischemia. There is no evidence of infarction.   Rest left ventricular function is normal. Rest EF: 60 %. Stress left ventricular function is normal. Stress EF: 67 %. End diastolic cavity size is normal. End systolic cavity size is normal. No evidence of transient ischemic dilation (TID) noted.   Myocardial blood flow was computed to be 1.1ml/g/min at rest and 2.26ml/g/min at stress. Global myocardial blood flow reserve was 1.74. With stress flows of 2 ml/g/min, MBF is likely normal and CFR is low due to higher resting flow. Overall, findings are normal.   Coronary calcium assessment not performed due to prior revascularization.   Electronically signed by: Parke Poisson, MD  Heart Catheterization:     Assessment & Plan:     ICD-10-CM   1. Myositis of lower extremity, unspecified laterality, unspecified myositis type  M60.9 CT CHEST HIGH RESOLUTION    2. Raynaud's phenomenon without gangrene  I73.00 CT CHEST HIGH RESOLUTION      Discussion:  This is a 72 year old gentleman initially seen for evaluation for progressive dyspnea and myalgia with known Raynaud's.  Patient was ultimately diagnosed with  inflammatory myositis.  Has been started on prednisone and Imuran.  Plan: From respiratory standpoint he is doing well.  Still tapering off of prednisone.  He has crackles on exam and a PFT that was completed which shows a reduced FVC. Regarding get an HRCT image of the chest and he can follow-up with Dr. Marchelle Gearing in [facility].   Current Outpatient Medications:    azaTHIOprine (IMURAN) 50 MG tablet, Take 3 tablets (150 mg total) by mouth daily., Disp: 90 tablet, Rfl: 0   clopidogrel (PLAVIX) 75 MG tablet, Take 1 tablet (75 mg total) by mouth daily., Disp: 90 tablet, Rfl: 3   Evolocumab (REPATHA SURECLICK) 140 MG/ML SOAJ, Inject 140 mg into the skin every 14 (fourteen) days., Disp: 2 mL, Rfl: 11  furosemide (LASIX) 20 MG tablet, Take 1 tablet (20 mg total) by mouth as needed for fluid or edema. (Patient taking differently: Take 20 mg by mouth daily as needed for fluid or edema.), Disp: 90 tablet, Rfl: 3   levothyroxine (SYNTHROID) 25 MCG tablet, Take 25 mcg by mouth daily in the afternoon., Disp: , Rfl:    lisinopril (ZESTRIL) 20 MG tablet, Take 1 tablet (20 mg total) by mouth daily., Disp: 90 tablet, Rfl: 3   MELATONIN PO, Take 1 tablet by mouth at bedtime as needed (sleep)., Disp: , Rfl:    metoprolol succinate (TOPROL-XL) 50 MG 24 hr tablet, Take 1 tablet (50 mg total) by mouth every evening. Take with or immediately following a meal. (Patient taking differently: Take 50 mg by mouth every evening.), Disp: 90 tablet, Rfl: 3   Multiple Vitamin (MULITIVITAMIN WITH MINERALS) TABS, Take 1 tablet by mouth daily., Disp: , Rfl:    nitroGLYCERIN (NITROSTAT) 0.4 MG SL tablet, Place 1 tablet (0.4 mg total) under the tongue every 5 (five) minutes as needed for chest pain., Disp: 25 tablet, Rfl: 3   potassium chloride (KLOR-CON M) 10 MEQ tablet, Take 1 tablet (10 mEq total) by mouth daily., Disp: 90 tablet, Rfl: 3   predniSONE (DELTASONE) 20 MG tablet, Take 1.5 tablets (30 mg total) by mouth daily with  breakfast for 30 days, THEN 1 tablet (20 mg total) daily with breakfast., Disp: 75 tablet, Rfl: 0   sulfamethoxazole-trimethoprim (BACTRIM) 400-80 MG tablet, Take 1 tablet by mouth 3 (three) times a week., Disp: 15 tablet, Rfl: 0   pantoprazole (PROTONIX) 40 MG tablet, Take 1 tablet (40 mg total) by mouth daily., Disp: 30 tablet, Rfl: 0   Josephine Igo, DO [facility] 06/15/2022 11:25 AM

## 2022-06-18 ENCOUNTER — Other Ambulatory Visit: Payer: Self-pay | Admitting: Internal Medicine

## 2022-06-18 DIAGNOSIS — M6089 Other myositis, multiple sites: Secondary | ICD-10-CM

## 2022-07-03 ENCOUNTER — Ambulatory Visit (HOSPITAL_COMMUNITY): Payer: PPO

## 2022-07-12 ENCOUNTER — Other Ambulatory Visit: Payer: Self-pay | Admitting: Cardiology

## 2022-07-12 ENCOUNTER — Other Ambulatory Visit: Payer: Self-pay | Admitting: Internal Medicine

## 2022-07-12 DIAGNOSIS — Z7952 Long term (current) use of systemic steroids: Secondary | ICD-10-CM

## 2022-07-14 ENCOUNTER — Ambulatory Visit (HOSPITAL_COMMUNITY)
Admission: RE | Admit: 2022-07-14 | Discharge: 2022-07-14 | Disposition: A | Discharge status: Home / Self Care | Payer: PPO | Source: Ambulatory Visit | Attending: Pulmonary Disease | Admitting: Pulmonary Disease

## 2022-07-14 DIAGNOSIS — I2510 Atherosclerotic heart disease of native coronary artery without angina pectoris: Secondary | ICD-10-CM | POA: Insufficient documentation

## 2022-07-14 DIAGNOSIS — I700 Atherosclerosis of aorta: Secondary | ICD-10-CM | POA: Diagnosis not present

## 2022-07-14 DIAGNOSIS — M609 Myositis, unspecified: Secondary | ICD-10-CM | POA: Insufficient documentation

## 2022-07-14 DIAGNOSIS — I7300 Raynaud's syndrome without gangrene: Secondary | ICD-10-CM | POA: Diagnosis not present

## 2022-07-14 NOTE — Telephone Encounter (Signed)
Last Fill: 06/05/2022  Next Visit: 07/16/2022  Last Visit: 06/03/2022  Dx: Other myositis of multiple sites   Current Dose per office note on 06/03/2022: Bactrim prophylaxis every other day.   Okay to refill Bactrim?

## 2022-07-15 NOTE — Progress Notes (Signed)
Office Visit Note  Patient: Sean [name]             Date of Birth: Oct 30, 1950           MRN: [id number]             PCP: [name], MD Referring: [name], MD Visit Date: 07/16/2022   Subjective:  Follow-up   History of Present Illness: Sean [name] is a 72 y.o. 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  Since her last visit he continues doing pretty well with gradual but consistent improvement in his strength and mobility.  He went on a Viking cruise of the [place] and came back with mild upper respiratory viral illness which resolved without incident.  He had an appointment in pulmonology clinic with Dr. Tonia Brooms had chest CT ordered and referred to see Dr. Marchelle Gearing in ILD clinic.  Chest CT obtained on the 28th no report available yet.  He still noticing mild persistent swelling in his legs is not bothering him too much.  Raynaud's symptoms just with cold exposure and not causing any serious pain or long-lasting symptoms.   Previous HPI 06/03/22 Sean [name] is a 72 y.o. 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  Since her last visit he is continue to stay active and working on his mobility with some additional partial improvement.  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  Leg swelling remains mostly improved.     Previous HPI 05/05/22 Sean [name] is a 72 y.o. male here for follow up for myositis currently on azathioprine titrating dose now at 100 mg daily and prednisone 60 mg daily.  Since last visit he had EMG testing on March 14 with Dr. 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  He is tolerating the medication okay without major issue.  He is working with physical therapy  focusing pretty extensively on his walking and also is doing a lot of shoulder mobility exercises.  He is seen partial improvement in his strength and stiffness with these exercises.  Most recently had a physical therapy session earlier today and has some muscle soreness.  He is also had more frequent nosebleeds reported particularly with bending forward position.  In the past week the frequency decreased he thinks this is from stopping sinus irrigation and applying Vaseline regularly.   Previous HPI 04/13/22 Sean [name] is a 72 y.o. male here for follow up for myositis after initial visit almost 3 weeks ago he continues on prednisone 60 mg daily.  He started working with physical therapy and has noticed very mild improvements in strength for example is able to brace his foot up 1 step at a time unaided on the right side.  No interval worsening of strength elsewhere no swallowing or respiratory difficulties.  That follow-up with Dr. Kateri Plummer last week he had repeat lab test with CK more elevated again to 4,387 up from 1,164 at his hospital discharge.  BNP was normal at 98. He was taking Lasix at increased dose to daily for edema with some benefit but continues to have swelling in bilateral hands and legs.  Myositis antibody panel has resulted with positive for 52 kDa SSA antibody.  Muscle biopsy result was pretty nonspecific.   03/23/22 Muscle Biopsy A. MUSCLE, LEFT THIGH, BIOPSY:  - Mildly abnormal muscle showing regenerative fibrosis.  Presence of COX negative fibrous could be age-related, but the presence of regenerating fibers could be in agreement with the diagnosis of myositis; however, there are no inflammatory infiltrates.  Clinical correlation is suggested.   Previous HPI 03/26/22 Sean [name] is a 72 y.o. male here for myositis after recent hospital discharge.  He has experienced progressive weakening in proximal muscles as well as increased peripheral edema ongoing for months leading up to this  evaluation.  Increase in his swelling and some dyspnea on exertion prompting cardiology evaluation in October and due to abnormal liver function test he was discontinued off of rosuvastatin at that time.  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  Over the next few months until January he experienced ongoing unintentional weight loss down about 20 pounds and worsening exertion intolerance difficulty with mobility and noticed progressive hunching over of his shoulders with height loss.  He saw gastroenterology due to the unintentional weight loss and abnormal LFTs with CT scan of the abdomen and pelvis with contrast was entirely normal besides prostate hypertrophy.  He eventually had updated echocardiogram and cardiac PET CT scan for evaluation showing normal function no abnormal metabolic uptake in the associated chest CT was negative for abnormal findings.  He saw Dr. Lenise Herald in pulmonology clinic due to his dyspnea despite normal cardiac workup where he was noted to have some myalgias and proximal weakness as well as apparent Raynaud's symptoms with pallor in several digits prompted concern for connective tissue disease.  Laboratory testing showed highly elevated CK 6777 and aldolase 83.1 and highly positive ANA antibody titer 1:1280.  He was admitted to the hospital and started on high-dose steroids to expedite muscle biopsy with sampling from the left vastus lateralis collected on February 5. He is currently taking prednisone 60 mg daily with plan to taper down by 20 mg/day dose each month and also has plans to follow-up with Dr. Allena Katz for EMG study.  Remains quite weak compared to his baseline that he is able to walk without assistance just only limited to short distances.  He is noticing some dyspnea and nonproductive cough especially at nighttime when lying flat.  Denies any significant  dysphagia.  He is not seeing any of the ongoing discoloration occurring in his fingers and toes.  No new skin rashes.   Review of Systems  Constitutional:  Negative for fatigue.  HENT:  Negative for mouth sores and mouth dryness.   Eyes:  Negative for dryness.  Respiratory:  Negative for shortness of breath.   Cardiovascular:  Negative for chest pain and palpitations.  Gastrointestinal:  Negative for blood in stool, constipation and diarrhea.  Endocrine: Negative for increased urination.  Genitourinary:  Negative for involuntary urination.  Musculoskeletal:  Negative for joint pain, gait problem, joint pain, joint swelling, myalgias, muscle weakness, morning stiffness, muscle tenderness and myalgias.  Skin:  Negative for color change, rash, hair loss and sensitivity to sunlight.  Allergic/Immunologic: Negative for susceptible to infections.  Neurological:  Negative for dizziness and headaches.  Hematological:  Negative for swollen glands.  Psychiatric/Behavioral:  Negative for depressed mood and sleep disturbance. The patient is not nervous/anxious.     PMFS History:  Patient Active Problem List   Diagnosis  Date Noted   High risk medication use 05/05/2022   Swelling 04/13/2022   Long term (current) use of systemic steroids 03/26/2022   Pedal edema 03/26/2022   Myositis 03/18/2022   Alcohol addiction (HCC) 03/18/2022   Hypothyroidism 03/18/2022   Elevated transaminase level 03/18/2022   RBBB 10/24/2015   Coronary artery disease    Tachycardia 05/09/2011   Abnormal liver enzymes 05/06/2011   HTN (hypertension) 04/29/2011   HLD (hyperlipidemia) 04/29/2011    Past Medical History:  Diagnosis Date   Alcohol addiction (HCC)    2010- admitted for withdrawal , detox admission after that   Arrhythmia    Coronary artery disease    95% mid LAD s/p BMS of LAD with aneurysmal segment   Depression    ED (erectile dysfunction)    HLD (hyperlipidemia)    HTN (hypertension)    Mental  disorder    Peptic ulcer    RBBB 10/24/2015    Family History  Problem Relation Age of Onset   Prostate cancer Father    Heart attack Brother 37   Anesthesia problems Neg Hx    Hypotension Neg Hx    Malignant hyperthermia Neg Hx    Pseudochol deficiency Neg Hx    Colon cancer Neg Hx    Stomach cancer Neg Hx    Esophageal cancer Neg Hx    Colon polyps Neg Hx    Past Surgical History:  Procedure Laterality Date   CORONARY STENT PLACEMENT     MUSCLE BIOPSY Left 03/23/2022   Procedure: LEFT THIGH MUSCLE BIOPSY;  Surgeon: Diamantina Monks, MD;  Location: [facility] OR;  Service: General;  Laterality: Left;   Social History   Social History Narrative   Live with wife in [place], is an Art gallery manager, currently driving trucks for a local firm. Quit smoking 1990 after smoking for 20 years. Drinks heavilyu (1 bottle of vodka or 18-24 beers a day). Used to smoke marijuana but quit in 1990s. Has united healthcare insurance. Goes to [employer] for alcohol cessation   Immunization History  Administered Date(s) Administered   Fluad Quad(high Dose 65+) 10/20/2021   PFIZER(Purple Top)SARS-COV-2 Vaccination 04/01/2019     Objective: Vital Signs: BP (!) 98/58 (BP Location: Left Arm, Patient Position: Sitting, Cuff Size: Normal)   Pulse 74   Resp 12   Ht 5\' 10"  (1.778 m)   Wt 200 lb (90.7 kg)   BMI 28.70 kg/m    Physical Exam Eyes:     Conjunctiva/sclera: Conjunctivae normal.  Cardiovascular:     Rate and Rhythm: Normal rate and regular rhythm.  Pulmonary:     Effort: Pulmonary effort is normal.     Breath sounds: Normal breath sounds.  Musculoskeletal:     Right lower leg: Edema present.     Left lower leg: Edema present.  Skin:    General: Skin is warm and dry.     Comments: Hands are cool with slight generalized swelling or nonpitting edema throughout both, no nail pitting telangiectasias or lesions  Neurological:     Mental Status: He is alert.     Comments: Strength is 5 out of 5  overall  Psychiatric:        Mood and Affect: Mood normal.      Musculoskeletal Exam: Shoulders full ROM no tenderness or swelling Elbows full ROM no tenderness or swelling Wrists full ROM no tenderness or swelling Fingers full ROM no tenderness or swelling Knees full ROM no tenderness or swelling Ankles full ROM  no tenderness or swelling   Investigation: No additional findings.  Imaging: No results found.  Recent Labs: Lab Results  Component Value Date   WBC 8.8 07/16/2022   HGB 13.0 (L) 07/16/2022   PLT 271 07/16/2022   NA 138 07/16/2022   K 4.9 07/16/2022   CL 100 07/16/2022   CO2 30 07/16/2022   GLUCOSE 116 (H) 07/16/2022   BUN 28 (H) 07/16/2022   CREATININE 0.84 07/16/2022   BILITOT 0.4 07/16/2022   ALKPHOS 57 04/08/2022   AST 49 (H) 07/16/2022   ALT 59 (H) 07/16/2022   PROT 5.9 (L) 07/16/2022   ALBUMIN 3.7 04/08/2022   CALCIUM 9.4 07/16/2022   GFRAA >90 05/04/2011    Speciality Comments: No specialty comments available.  Procedures:  No procedures performed Allergies: Patient has no known allergies.   Assessment / Plan:     Visit Diagnoses: Other myositis of multiple sites - Plan: CK, azaTHIOprine (IMURAN) 50 MG tablet  Clinically he is showing consistent improvement will recheck CK level today if this is better we will continue gradual prednisone tapering while maintaining azathioprine.  He has been scheduled for ILD clinic evaluation with which I definitely agree.  If there are CT findings concerning for active inflammation found may need to consider additional combination therapy for this.  If numbers are good we will continue a gradual tapering down to 20 then 15 then 10 mg daily over the next 3 months.  High risk medication use - Plan: CBC with Differential/Platelet, COMPLETE METABOLIC PANEL WITH GFR  Checking CBC and CMP for medication monitoring on azathioprine and high-dose prednisone.  He had 1 upper respiratory viral illness associated with a  cruise that was uncomplicated.  I recommend with reduction of prednisone to 20 mg daily he can discontinue prophylactic antibiotic.  Orders: Orders Placed This Encounter  Procedures   CBC with Differential/Platelet   CK   COMPLETE METABOLIC PANEL WITH GFR   Meds ordered this encounter  Medications   azaTHIOprine (IMURAN) 50 MG tablet    Sig: Take 3 tablets (150 mg total) by mouth daily.    Dispense:  90 tablet    Refill:  2     Follow-Up Instructions: Return in about 3 months (around 10/16/2022) for Myositis on AZA/GC f/u 3mos.   Fuller Plan, MD  Note - This record has been created using [employer].  Chart creation errors have been sought, but may not always  have been located. Such creation errors do not reflect on  the standard of medical care.

## 2022-07-16 ENCOUNTER — Encounter: Payer: Self-pay | Admitting: Internal Medicine

## 2022-07-16 ENCOUNTER — Ambulatory Visit: Payer: PPO | Attending: Internal Medicine | Admitting: Internal Medicine

## 2022-07-16 VITALS — BP 98/58 | HR 74 | Resp 12 | Ht 70.0 in | Wt 200.0 lb

## 2022-07-16 DIAGNOSIS — M6089 Other myositis, multiple sites: Secondary | ICD-10-CM

## 2022-07-16 DIAGNOSIS — Z79899 Other long term (current) drug therapy: Secondary | ICD-10-CM | POA: Diagnosis not present

## 2022-07-16 LAB — CBC WITH DIFFERENTIAL/PLATELET
Absolute Monocytes: 642 cells/uL (ref 200–950)
Eosinophils Absolute: 18 cells/uL (ref 15–500)
Eosinophils Relative: 0.2 %
Lymphs Abs: 1188 cells/uL (ref 850–3900)
Neutro Abs: 6890 cells/uL (ref 1500–7800)

## 2022-07-16 MED ORDER — AZATHIOPRINE 50 MG PO TABS: 150.0000 mg | ORAL_TABLET | Freq: Every day | ORAL | 2 refills | Status: DC

## 2022-07-17 LAB — CBC WITH DIFFERENTIAL/PLATELET
Basophils Absolute: 62 cells/uL (ref 0–200)
Basophils Relative: 0.7 %
HCT: 39.3 % (ref 38.5–50.0)
Hemoglobin: 13 g/dL — ABNORMAL LOW (ref 13.2–17.1)
MCH: 32.7 pg (ref 27.0–33.0)
MCHC: 33.1 g/dL (ref 32.0–36.0)
MCV: 99 fL (ref 80.0–100.0)
MPV: 10.5 fL (ref 7.5–12.5)
Monocytes Relative: 7.3 %
Neutrophils Relative %: 78.3 %
Platelets: 271 10*3/uL (ref 140–400)
RBC: 3.97 10*6/uL — ABNORMAL LOW (ref 4.20–5.80)
RDW: 15.4 % — ABNORMAL HIGH (ref 11.0–15.0)
Total Lymphocyte: 13.5 %
WBC: 8.8 10*3/uL (ref 3.8–10.8)

## 2022-07-17 LAB — COMPLETE METABOLIC PANEL WITH GFR
AG Ratio: 1.7 (calc) (ref 1.0–2.5)
ALT: 59 U/L — ABNORMAL HIGH (ref 9–46)
AST: 49 U/L — ABNORMAL HIGH (ref 10–35)
Albumin: 3.7 g/dL (ref 3.6–5.1)
Alkaline phosphatase (APISO): 57 U/L (ref 35–144)
BUN/Creatinine Ratio: 33 (calc) — ABNORMAL HIGH (ref 6–22)
BUN: 28 mg/dL — ABNORMAL HIGH (ref 7–25)
CO2: 30 mmol/L (ref 20–32)
Calcium: 9.4 mg/dL (ref 8.6–10.3)
Chloride: 100 mmol/L (ref 98–110)
Creat: 0.84 mg/dL (ref 0.70–1.28)
Globulin: 2.2 g/dL (calc) (ref 1.9–3.7)
Glucose, Bld: 116 mg/dL — ABNORMAL HIGH (ref 65–99)
Potassium: 4.9 mmol/L (ref 3.5–5.3)
Sodium: 138 mmol/L (ref 135–146)
Total Bilirubin: 0.4 mg/dL (ref 0.2–1.2)
Total Protein: 5.9 g/dL — ABNORMAL LOW (ref 6.1–8.1)
eGFR: 93 mL/min/{1.73_m2} (ref >=60)

## 2022-07-17 LAB — CK: Total CK: 568 U/L — ABNORMAL HIGH (ref 44–196)

## 2022-07-18 NOTE — Progress Notes (Signed)
CT imaging consistent with early ILD. Please keep appt with Dr. Marchelle Gearing  Thanks,  BLI  Josephine Igo, DO [facility] 07/18/2022 3:33 PM

## 2022-07-20 ENCOUNTER — Telehealth: Payer: Self-pay | Admitting: Pulmonary Disease

## 2022-07-20 NOTE — Telephone Encounter (Signed)
Spoke to patient and informed him of CT results. Pt verbalized understanding. Nothing further needed.

## 2022-07-28 ENCOUNTER — Other Ambulatory Visit: Payer: PPO

## 2022-07-28 ENCOUNTER — Encounter: Payer: Self-pay | Admitting: Internal Medicine

## 2022-07-28 ENCOUNTER — Ambulatory Visit (INDEPENDENT_AMBULATORY_CARE_PROVIDER_SITE_OTHER): Payer: PPO | Admitting: Internal Medicine

## 2022-07-28 VITALS — BP 110/60 | HR 75 | Ht 70.0 in | Wt 202.4 lb

## 2022-07-28 DIAGNOSIS — M359 Systemic involvement of connective tissue, unspecified: Secondary | ICD-10-CM | POA: Diagnosis not present

## 2022-07-28 DIAGNOSIS — Z87891 Personal history of nicotine dependence: Secondary | ICD-10-CM

## 2022-07-28 DIAGNOSIS — J8489 Other specified interstitial pulmonary diseases: Secondary | ICD-10-CM

## 2022-07-28 DIAGNOSIS — R0609 Other forms of dyspnea: Secondary | ICD-10-CM

## 2022-07-28 DIAGNOSIS — M609 Myositis, unspecified: Secondary | ICD-10-CM | POA: Diagnosis not present

## 2022-07-28 DIAGNOSIS — Z79899 Other long term (current) drug therapy: Secondary | ICD-10-CM | POA: Diagnosis not present

## 2022-07-28 DIAGNOSIS — F10230 Alcohol dependence with withdrawal, uncomplicated: Secondary | ICD-10-CM

## 2022-07-28 DIAGNOSIS — I2510 Atherosclerotic heart disease of native coronary artery without angina pectoris: Secondary | ICD-10-CM | POA: Diagnosis not present

## 2022-07-28 DIAGNOSIS — I359 Nonrheumatic aortic valve disorder, unspecified: Secondary | ICD-10-CM

## 2022-07-28 DIAGNOSIS — I7300 Raynaud's syndrome without gangrene: Secondary | ICD-10-CM

## 2022-07-28 DIAGNOSIS — Z7185 Encounter for immunization safety counseling: Secondary | ICD-10-CM | POA: Diagnosis not present

## 2022-07-28 DIAGNOSIS — R7401 Elevation of levels of liver transaminase levels: Secondary | ICD-10-CM

## 2022-07-28 LAB — HEPATIC FUNCTION PANEL
ALT: 52 U/L (ref 0–53)
AST: 52 U/L — ABNORMAL HIGH (ref 0–37)
Albumin: 3.9 g/dL (ref 3.5–5.2)
Alkaline Phosphatase: 52 U/L (ref 39–117)
Bilirubin, Direct: 0.1 mg/dL (ref 0.0–0.3)
Total Bilirubin: 0.6 mg/dL (ref 0.2–1.2)
Total Protein: 6.2 g/dL (ref 6.0–8.3)

## 2022-07-28 NOTE — Patient Instructions (Addendum)
ICD-10-CM   1. Interstitial lung disease due to connective tissue disease (HCC)  J84.89    M35.9     2. DOE (dyspnea on exertion)  R06.09     3. Myositis of lower extremity, unspecified laterality, unspecified myositis type  M60.9     4. Raynaud's phenomenon without gangrene  I73.00     5. History of smoking 30 or more pack years  Z87.891     6. Coronary artery calcification seen on CAT scan  I25.10     7. Aortic valve calcification  I35.9        Interstitial lung disease due to connective tissue disease (HCC) DOE (dyspnea on exertion) Myositis of lower extremity, unspecified laterality, unspecified myositis type Raynaud's phenomenon without gangrene High Risk Medication use   - CK improving as of May 2024 -LFT slightly high in May 2024 and concre for drug induced liver injury with alcohol  Plan - r   - I have sent a message to our thoracic radiologist to identify the onset and the severely of the interstitial lung disease  -I will recommend antifibrotic's if the pulmonary function test at follow-up is not improving or is getting worse or if the severity of lung disease burden is greater than 10-20%  - with intenti for future anti-fibrotic - -Recheck  CK and liver function test today 07/28/2022  - Check RUQ    History of smoking 30 or more pack years.Quit > 30 years ago  - no evidence of lung cancer June 2024  Plan  - capture lung cancer screening on CT chest for ILD through age 26 per ACS guidelnes  Coronary artery calcification seen on CAT scan Aortic valve calcification  - last echo dec 2023 - normal  - last Stress test 2014 and dec 2023 without ischemia  Plan  - per cardiology  Vaccine counseling  Plan  - Recommend RSV vaccine  Alcohol use High LFT   Plan - Check right upper quadrant ultrasound - reheck LFT  Followup  - 8 weeks after spirometry 30-minute visit with Dr. Marchelle Gearing

## 2022-07-28 NOTE — Progress Notes (Signed)
Persistent but improving transaminitis.  We will not be calling with this result.

## 2022-07-28 NOTE — Progress Notes (Signed)
IOV 03/11/22 - Patient of Dr Tonia Brooms   This is a 72 year old gentleman, history of alcohol abuse, coronary artery disease, depression, hypertension, hyperlipidemia.  Patient is a former smoker quit in 19 90, 34-pack-year history. He trys to walk a lot. He has dogs that he walks regularly. Over the past 4 months he has had increased sob and fatigue. Distances and inclines make walking worse. He saw cardiology and they did several studies and everything seemed fine. He was sent to GI for evaluation for elevated LFTs, still drinking alcohol daily.  He feels like he is becoming weaker and weaker over the past couple of months.  He is finding that he is having pains in his muscles and when he describes this he points to locations in large muscle groups and side of the thigh as well as shoulders.  He is also noticed ongoing weight loss.  He also has recurrent episodes of Raynaud's of the hands.  OV 03/18/2022: Here today for evaluation of recent abnormal labs.  His CK is elevated, aldolase elevated, ANA titer was positive.  For some reason his myositis antibody panel was canceled.  04/08/2022 Patient was seen last month for shortness of breath/dyspnea symptoms. He was dx with inflammatory myositis recently. Currently on prednisone 60mg  and following with rheumatology.   No issues with shortness of breath. Congestion is a lot better since being discharged from the hospital. His main complaint is muscle fatigue. He first noticed weakness last spring. He has no muscle pain except for chronic lower back pain. He has seen some improvement in his functioning since starting steroids. He is able to do a little bit more than he could last week. He is getting physical therapy. After therapy he was tired for the rest of the day.   He also has generalized fluid retention that started before he began taking high dose steroids, he increased lasix 40mg  for a couple of days. He has cut back on alcohol but still having 2-3  drinks every couple of days. Following with Dr. Allena Katz with neurology , planning for EMG and NCS.   OV 06/15/2022: Doing well today.  He feels so much better.  He is stronger.  Tolerating his Imuran plus prednisone.  He had pulmonary function test completed which reviewed today in the office which shows a reduced FVC.  His DLCO however is normal.  He does have some crackles on exam has not had any axial CT imaging.  Still feels dyspneic when he exerts himself.  OV 07/28/2022 -transfer of care from Dr. Elige Radon Icard to Dr. Marchelle Gearing at the ILD center  Subjective:  Patient ID: Sean [name], male , DOB: Jun 14, 1950 , age 85 y.o. , MRN: [id number] , ADDRESS: [address] [place] [address] PCP [name], MD Patient Care Team: [name], MD as PCP - General (Family Medicine) Quintella Reichert, MD as PCP - Cardiology (Cardiology)  This Provider for this visit: Treatment Team:  Attending Provider: Kalman Shan, MD    07/28/2022 -   Chief Complaint  Patient presents with   Follow-up    F/up on Myositis, ILD     HPI Sean [name] 72 y.o. -I am meeting him for the first time.  History is gained from talking to him.  There is no independent history.  History is also gained from review of the external records that include cardiology in December 2023 and rheumatology in February 2024 and April 2024.  He tells me that a  year ago he started noticing some difficulty standing up but in October 2023 while walking the dog he became extremely fatigued.  He was admitted to the hospital after that for 6 days.  He received IV steroids.  He says after that he established with Dr. Audie Box.  He has been on prednisone since early 2024.  He established with Dr. Sheliah Hatch and rheumatology.  Has been given a diagnosis of myositis.  He was also started on azathioprine.  Currently is on prednisone taper.  The plan is to get him to half tablet per day [?  Milligram] within the next 2  months.  He is tolerating these medications fine.  Has become a little cushingoid.  He states he is much better but still weak.  His current symptom scores are below.  He does have a mild cough but it is is clearing of the throat.  This is chronic.  He is noticed to be on ACE inhibitor's.  He did return from a [place] cruise 2 weeks ago and got sick but he is now recovered.  The cough is gone away from that.   SYMPTOM SCALE - ILD 07/28/2022  Current weight   O2 use ra  Shortness of Breath 0 -> 5 scale with 5 being worst (score 6 If unable to do)  At rest 0  Simple tasks - showers, clothes change, eating, shaving 0  Household (dishes, doing bed, laundry) 0  Shopping 1  Walking level at own pace 1  Walking up Stairs 2  Total (30-36) Dyspnea Score 4  How bad is your cough? 1 -ACE inhibitor.  How bad is your fatigue 2  How bad is nausea 0  How bad is vomiting?  0  How bad is diarrhea? 2  How bad is anxiety? 1  How bad is depression 1  Any chronic pain - if so where and how bad x    Simple office walk 224 (66+46 x 2) feet Pod A at [facility] x  3 laps goal with forehead probe 07/28/2022    O2 used ra   Number laps completed 15 sit stand   Comments about pace good   Resting Pulse Ox/HR 98% and 69/min   Final Pulse Ox/HR 96% and 83/min   Desaturated </= 88% no   Desaturated <= 3% points no   Got Tachycardic >/= 90/min no   Symptoms at end of test none   Miscellaneous comments none       Myositis : Saw Dr Orest Dikes rhem 06/03/22  and 2/8/244 -external record reviwd:  Coronary arter calcification:CAD/ Has hx of remote bare metal strength.  SAw NP 01/23/23 [place] for cardiollogy: This extrenal record reviewed.  Toprol started. Echo ordered was normal . Advised to continue plavix.  Last stress test 2014 -> . Had PET CT 12/19/3: results reviewed. No evidenc of ischemia  Alcoholosm: He says he was dependent on alcohol in the past but currently is not but he still drinks 3-4 beers a day.   He has never had a right upper quadrant ultrasound.  He says he is not currently addicted but he does drink daily.  He does not think he have a withdrawal if he quits.  CT Chest data - HRCT 07/14/22 - indepenently visualized, revewied and independently conclude with findings below -> although I personally find the disease burden to be less than 10%.  I did find a prior CT abdomen lung image.  It appears that this ILD is  new compared to last year.  I personally contacted Dr. Allegra Lai via email and she replied back.  She is going to review the image again.  Narrative & Impression  CLINICAL DATA:  72 year old male with history of myositis. Evaluate for interstitial lung disease.   EXAM: CT CHEST WITHOUT CONTRAST   TECHNIQUE: Multidetector CT imaging of the chest was performed following the standard protocol without intravenous contrast. High resolution imaging of the lungs, as well as inspiratory and expiratory imaging, was performed.   RADIATION DOSE REDUCTION: This exam was performed according to the departmental dose-optimization program which includes automated exposure control, adjustment of the mA and/or kV according to patient size and/or use of iterative reconstruction technique.   COMPARISON:  No priors.   FINDINGS: Cardiovascular: Heart size is normal. There is no significant pericardial fluid, thickening or pericardial calcification. There is aortic atherosclerosis, as well as atherosclerosis of the great vessels of the mediastinum and the coronary arteries, including calcified atherosclerotic plaque in the left main, left anterior descending, left circumflex and right coronary arteries. Calcifications of the aortic valve.   Mediastinum/Nodes: No pathologically enlarged mediastinal or hilar lymph nodes. Please note that accurate exclusion of hilar adenopathy is limited on noncontrast CT scans. Esophagus is unremarkable in appearance. No axillary lymphadenopathy.    Lungs/Pleura: High-resolution images demonstrate some mild ground-glass attenuation and septal thickening, most evident in the lung bases bilaterally. Minimal subpleural reticulation. No traction bronchiectasis or honeycombing. Inspiratory and expiratory imaging demonstrates mild air trapping indicative of mild small airways disease. No acute consolidative airspace disease. No pleural effusions.   Upper Abdomen: Aortic atherosclerosis.   Musculoskeletal: There are no aggressive appearing lytic or blastic lesions noted in the visualized portions of the skeleton.   IMPRESSION: 1. The appearance of the lungs suggests early changes of interstitial lung disease, with a spectrum of findings at this time categorized as indeterminate for usual interstitial pneumonia (UIP) per current ATS guidelines. Repeat high-resolution chest CT is suggested in 12 months to assess for temporal changes in the appearance of the lung parenchyma. 2. Aortic atherosclerosis, in addition to left main and three-vessel coronary artery disease. Assessment for potential risk factor modification, dietary therapy or pharmacologic therapy may be warranted, if clinically indicated. 3. There are calcifications of the aortic valve. Echocardiographic correlation for evaluation of potential valvular dysfunction may be warranted if clinically indicated.   Aortic Atherosclerosis (ICD10-I70.0).     Electronically Signed   By: Trudie Reed M.D.   On: 07/18/2022 07:42      No results found.   ECHO 127/23 - normal.   PFT     Latest Ref Rng & Units 06/04/2022   11:25 AM  PFT Results  FVC-Pre L 3.00   FVC-Predicted Pre % 68   FVC-Post L 2.95   FVC-Predicted Post % 67   Pre FEV1/FVC % % 81   Post FEV1/FCV % % 84   FEV1-Pre L 2.43   FEV1-Predicted Pre % 75   FEV1-Post L 2.47   DLCO uncorrected ml/min/mmHg 21.96   DLCO UNC% % 85   DLCO corrected ml/min/mmHg 22.28   DLCO COR %Predicted % 86   DLVA  Predicted % 123   TLC L 5.25   TLC % Predicted % 74   RV % Predicted % 86     Latest Reference Range & Units 06/03/08 22:15 06/08/08 13:30 04/29/11 08:56 04/30/11 06:11 05/01/11 06:30 05/02/11 06:30 05/03/11 04:55 05/04/11 13:12 12/23/21 08:40 01/28/22 11:24 03/18/22 20:02 03/19/22 04:06 03/20/22 06:09  03/23/22 03:46 04/08/22 09:53 05/05/22 15:05 06/03/22 11:41 07/16/22 14:41  Creatinine 0.70 - 1.28 mg/dL 1.61 0.96 0.45 4.09 8.11 1.01 0.95 0.85 0.85 1.01 1.07 1.01 1.00 0.86 1.02 0.83 0.79 0.84    Latest Reference Range & Units 06/03/08 22:15 06/08/08 13:30 04/29/11 18:11 05/03/11 04:55 05/04/11 13:12 05/09/11 19:32 11/28/12 08:22 05/23/13 09:36 11/18/15 07:48 11/02/16 10:17 10/19/17 14:19 10/27/18 08:53 12/19/18 09:05 05/22/21 07:50 08/26/21 07:56 09/23/21 08:26 11/10/21 09:09 12/15/21 09:58 01/27/22 10:05 03/18/22 20:02 03/19/22 04:06 03/20/22 06:09 03/23/22 03:46 04/08/22 09:53 05/05/22 15:05 06/03/22 11:41 07/16/22 14:41  AST 10 - 35 U/L 70 (H) 88 (H) 844 (H) 388 (H) 286 (H) 78 (H)  21 21 23 26 24  67 (H)   72 (H) 118 (H) 157 (H) 205 (H) 255 (H) 212 (H) 211 (H) 135 (H) 199 (H) 98 (H) 94 (H) 49 (H)  ALT 9 - 46 U/L 49 73 (H) 325 (H) 215 (H) 203 (H) 110 (H) 30 24 24 19 23 21 26  61 (H) 28 55 (H) 63 (H) 93 (H) 140 (H) 176 (H) 215 (H) 175 (H) 207 (H) 258 (H) 220 (H) 125 (H) 124 (H) 59 (H)  (H): Data is abnormally high  Latest Reference Range & Units 03/11/22 11:39 03/20/22 06:09 03/21/22 02:26 03/22/22 02:12 03/23/22 03:46 04/08/22 09:53 05/05/22 15:05 06/03/22 11:41 07/16/22 14:41  CK Total 44 - 196 U/L 6,777 (H) 5,250 (H) 2,592 (H) 1,605 (H) 1,164 (H) 4,387 (H) 1,900 (H) 1,452 (H) 568 (H)  Pro B Natriuretic peptide (BNP) 0.0 - 100.0 pg/mL      98.0     (H): Data is abnormally high    Latest Reference Range & Units 06/03/08 22:15 06/08/08 13:30 04/29/11 08:56 04/30/11 06:11 05/02/11 12:37 05/03/11 04:55 05/04/11 13:12 05/09/11 19:32 03/18/22 20:02 03/19/22 04:06 03/20/22 06:09 03/23/22 03:46 05/05/22  15:05 06/03/22 11:41 07/16/22 14:41  Hemoglobin 13.2 - 17.1 g/dL 91.4 78.2 95.6 21.3 08.6 12.8 (L) 14.3 11.7 (L) 14.1 13.6 14.8 13.4 14.1 14.2 13.0 (L)  (L): Data is abnormally low  has a past medical history of Alcohol addiction (HCC), Arrhythmia, Coronary artery disease, Depression, ED (erectile dysfunction), HLD (hyperlipidemia), HTN (hypertension), Mental disorder, Peptic ulcer, and RBBB (10/24/2015).   reports that he quit smoking about 34 years ago. His smoking use included cigarettes. He has a 30.00 pack-year smoking history. He has been exposed to tobacco smoke. He has never used smokeless tobacco.  Past Surgical History:  Procedure Laterality Date   CORONARY STENT PLACEMENT     MUSCLE BIOPSY Left 03/23/2022   Procedure: LEFT THIGH MUSCLE BIOPSY;  Surgeon: Diamantina Monks, MD;  Location: [facility] OR;  Service: General;  Laterality: Left;    No Known Allergies  Immunization History  Administered Date(s) Administered   Fluad Quad(high Dose 65+) 10/20/2021   PFIZER(Purple Top)SARS-COV-2 Vaccination 04/01/2019    Family History  Problem Relation Age of Onset   Prostate cancer Father    Heart attack Brother 31   Anesthesia problems Neg Hx    Hypotension Neg Hx    Malignant hyperthermia Neg Hx    Pseudochol deficiency Neg Hx    Colon cancer Neg Hx    Stomach cancer Neg Hx    Esophageal cancer Neg Hx    Colon polyps Neg Hx      Current Outpatient Medications:    azaTHIOprine (IMURAN) 50 MG tablet, Take 3 tablets (150 mg total) by mouth daily., Disp: 90 tablet, Rfl: 2   clopidogrel (PLAVIX) 75 MG tablet, TAKE  ONE TABLET BY MOUTH ONE TIME DAILY, Disp: 90 tablet, Rfl: 0   furosemide (LASIX) 20 MG tablet, Take 1 tablet (20 mg total) by mouth as needed for fluid or edema. (Patient taking differently: Take 20 mg by mouth daily as needed for fluid or edema.), Disp: 90 tablet, Rfl: 3   levothyroxine (SYNTHROID) 25 MCG tablet, Take 25 mcg by mouth daily in the afternoon., Disp: , Rfl:     lisinopril (ZESTRIL) 10 MG tablet, Take 10 mg by mouth daily., Disp: , Rfl:    MELATONIN PO, Take 1 tablet by mouth at bedtime as needed (sleep)., Disp: , Rfl:    metoprolol succinate (TOPROL-XL) 50 MG 24 hr tablet, Take 1 tablet (50 mg total) by mouth every evening. Take with or immediately following a meal. (Patient taking differently: Take 50 mg by mouth every evening.), Disp: 90 tablet, Rfl: 3   Multiple Vitamin (MULITIVITAMIN WITH MINERALS) TABS, Take 1 tablet by mouth daily., Disp: , Rfl:    nitroGLYCERIN (NITROSTAT) 0.4 MG SL tablet, Place 1 tablet (0.4 mg total) under the tongue every 5 (five) minutes as needed for chest pain., Disp: 25 tablet, Rfl: 3   predniSONE (DELTASONE) 20 MG tablet, Take 1.5 tablets (30 mg total) by mouth daily with breakfast for 30 days, THEN 1 tablet (20 mg total) daily with breakfast., Disp: 75 tablet, Rfl: 0   Evolocumab (REPATHA SURECLICK) 140 MG/ML SOAJ, Inject 140 mg into the skin every 14 (fourteen) days. (Patient not taking: Reported on 07/16/2022), Disp: 2 mL, Rfl: 11   pantoprazole (PROTONIX) 40 MG tablet, Take 1 tablet (40 mg total) by mouth daily., Disp: 30 tablet, Rfl: 0      Objective:   Vitals:   07/28/22 0906  BP: 110/60  Pulse: 75  SpO2: 99%  Weight: 202 lb 6.4 oz (91.8 kg)  Height: 5\' 10"  (1.778 m)    Estimated body mass index is 29.04 kg/m as calculated from the following:   Height as of this encounter: 5\' 10"  (1.778 m).   Weight as of this encounter: 202 lb 6.4 oz (91.8 kg).  @WEIGHTCHANGE @  [employer]   07/28/22 0906  Weight: 202 lb 6.4 oz (91.8 kg)     Physical Exam   General: No distress. cushingoid O2 at rest: o Cane present: no Sitting in wheel chair: no Frail: no Obese: no Neuro: Alert and Oriented x 3. GCS 15. Speech normal Psych: Pleasant Resp:  Barrel Chest - no.  Wheeze - no, Crackles - YES RIGT BASE, No overt respiratory distress CVS: Normal heart sounds. Murmurs - no Ext: Stigmata of Connective Tissue  Disease - NO HEENT: Normal upper airway. PEERL +. No post nasal drip        Assessment:       ICD-10-CM   1. Interstitial lung disease due to connective tissue disease (HCC)  J84.89    M35.9     2. DOE (dyspnea on exertion)  R06.09     3. Myositis of lower extremity, unspecified laterality, unspecified myositis type  M60.9     4. Raynaud's phenomenon without gangrene  I73.00     5. History of smoking 30 or more pack years  Z87.891     6. Coronary artery calcification seen on CAT scan  I25.10     7. Aortic valve calcification  I35.9     8. Elevated transaminase level  R74.01     9. High risk medication use  Z79.899     10. Vaccine counseling  Z71.85     11. Alcohol dependence with uncomplicated withdrawal (HCC)  F10.230      Discussed Nintedanib/Ofev requires intensive drug monitoring due to high concerns for Adverse effects of , including  Drug Induced Liver Injury, significant GI side effects that include but not limited to Diarrhea, Nausea, Vomiting,  and other system side effects that include Fatigue,  weight loss. Cardiac side effects are a black box warning as well. These will be monitored with  blood work such as LFT initially once a month for 6 months and then quarterly   -> will conisider this based on repeat discussion with Radiology, repeat PFT in 8 weeks and also RUQ [place]   Also on Imuran which can cause various toxicities including liver.  Will check repeat liver function test today.     Plan:     Patient Instructions     ICD-10-CM   1. Interstitial lung disease due to connective tissue disease (HCC)  J84.89    M35.9     2. DOE (dyspnea on exertion)  R06.09     3. Myositis of lower extremity, unspecified laterality, unspecified myositis type  M60.9     4. Raynaud's phenomenon without gangrene  I73.00     5. History of smoking 30 or more pack years  Z87.891     6. Coronary artery calcification seen on CAT scan  I25.10     7. Aortic valve  calcification  I35.9        Interstitial lung disease due to connective tissue disease (HCC) DOE (dyspnea on exertion) Myositis of lower extremity, unspecified laterality, unspecified myositis type Raynaud's phenomenon without gangrene High Risk Medication use   - CK improving as of May 2024 -LFT slightly high in May 2024 and concre for drug induced liver injury with alcohol  Plan - r   - I have sent a message to our thoracic radiologist to identify the onset and the severely of the interstitial lung disease  -I will recommend antifibrotic's if the pulmonary function test at follow-up is not improving or is getting worse or if the severity of lung disease burden is greater than 10-20%  - with intenti for future anti-fibrotic - -Recheck  CK and liver function test today 07/28/2022  - Check RUQ    History of smoking 30 or more pack years.Quit > 30 years ago  - no evidence of lung cancer June 2024  Plan  - capture lung cancer screening on CT chest for ILD through age 26 per ACS guidelnes  Coronary artery calcification seen on CAT scan Aortic valve calcification  - last echo dec 2023 - normal  - last Stress test 2014 and dec 2023 without ischemia  Plan  - per cardiology  Vaccine counseling  Plan  - Recommend RSV vaccine  Alcohol use High LFT   Plan - Check right upper quadrant ultrasound - reheck LFT  Followup  - 8 weeks after spirometry 30-minute visit with Dr. Marchelle Gearing   ( Level 05 visit E&M 2024: Estb >= 40 min n  visit type: on-site physical face to visit  in total care time and counseling or/and coordination of care by this undersigned MD - Dr Kalman Shan. This includes one or more of the following on this same day 07/28/2022: pre-charting, chart review, note writing, documentation discussion of test results, diagnostic or treatment recommendations, prognosis, risks and benefits of management options, instructions, education, compliance or risk-factor  reduction. It excludes time spent by the CMA or office staff  in the care of the patient. Actual time 50 min)    SIGNATURE    Dr. Kalman Shan, M.D., F.C.C.P,  Pulmonary and Critical Care Medicine Staff Physician, [facility] Center Director - Interstitial Lung Disease  Program  [facility] at [facility] [place], [place], [address]  Pager: [phone], If no answer or between  15:00h - 7:00h: call [phone] Telephone: [phone]  9:46 AM 07/28/2022   [facility] OFFICE  The table below is from the 2021 E/M guidelines, first released in 2021, with minor revisions added in 2023. Must meet the requirements for 2 out of 3 dimensions to qualify.    Number and complexity of problems addressed Amount and/or complexity of data reviewed Risk of complications and/or morbidity  Severe exacerbation of chronic illness  Acute or chronic illnesses that may pose a threat to life or bodily function, e.g., multiple trauma, acute MI, pulmonary embolus, severe respiratory distress, progressive rheumatoid arthritis, psychiatric illness with potential threat to self or others, peritonitis, acute renal failure, abrupt change in neurological status Must meet the requirements for 2 of 3 of the categories)  Category 1: Tests and documents, historian  Any combination of 3 of the following:  Assessment requiring an independent historian  Review of prior external note(s) from each unique source  Review of results of each unique test - several   Ordering of each unique test - PFT, RUQ, blood work    Category 2: Interpretation of tests    Independent interpretation of a test performed by another physician/other qualified health care professional (not separately reported) - CT CHEST  Category 3: Discuss management/tests  Discussion of management or test interpretation with external physician/other qualified health care  professional/appropriate source (not separately reported) - [place]  HIGH risk of morbidity from additional diagnostic testing or treatment Examples only:  Drug therapy requiring intensive monitoring for toxicity  Decision for elective major surgery with identified pateint or procedure risk factors  Decision regarding hospitalization or escalation of level of care  Decision for DNR or to de-escalate care   Parenteral controlled  substances

## 2022-08-03 DIAGNOSIS — I10 Essential (primary) hypertension: Secondary | ICD-10-CM | POA: Diagnosis not present

## 2022-08-03 DIAGNOSIS — E039 Hypothyroidism, unspecified: Secondary | ICD-10-CM | POA: Diagnosis not present

## 2022-08-03 DIAGNOSIS — R748 Abnormal levels of other serum enzymes: Secondary | ICD-10-CM | POA: Diagnosis not present

## 2022-08-03 DIAGNOSIS — M609 Myositis, unspecified: Secondary | ICD-10-CM | POA: Diagnosis not present

## 2022-08-03 DIAGNOSIS — E785 Hyperlipidemia, unspecified: Secondary | ICD-10-CM | POA: Diagnosis not present

## 2022-08-03 DIAGNOSIS — R7309 Other abnormal glucose: Secondary | ICD-10-CM | POA: Diagnosis not present

## 2022-08-03 DIAGNOSIS — Z0000 Encounter for general adult medical examination without abnormal findings: Secondary | ICD-10-CM | POA: Diagnosis not present

## 2022-08-03 LAB — LAB REPORT - SCANNED: EGFR: 92

## 2022-08-07 ENCOUNTER — Encounter: Payer: Self-pay | Admitting: Cardiology

## 2022-08-07 ENCOUNTER — Ambulatory Visit: Payer: PPO | Attending: Cardiology | Admitting: Cardiology

## 2022-08-07 VITALS — BP 128/86 | HR 66 | Ht 70.0 in | Wt 198.2 lb

## 2022-08-07 DIAGNOSIS — E785 Hyperlipidemia, unspecified: Secondary | ICD-10-CM | POA: Diagnosis not present

## 2022-08-07 DIAGNOSIS — M3320 Polymyositis, organ involvement unspecified: Secondary | ICD-10-CM

## 2022-08-07 DIAGNOSIS — J849 Interstitial pulmonary disease, unspecified: Secondary | ICD-10-CM | POA: Insufficient documentation

## 2022-08-07 DIAGNOSIS — I2510 Atherosclerotic heart disease of native coronary artery without angina pectoris: Secondary | ICD-10-CM | POA: Diagnosis not present

## 2022-08-07 DIAGNOSIS — I10 Essential (primary) hypertension: Secondary | ICD-10-CM | POA: Diagnosis not present

## 2022-08-07 NOTE — Progress Notes (Addendum)
Date:  08/07/2022   ID:  [name], DOB 1950/10/31, MRN [id number]  PCP:  [name], MD  Cardiologist:   Electrophysiologist:  None   Chief Complaint:  CAD, HTN, HLD  History of Present Illness:    Sean [name] is a 72 y.o. male with a hx of ASCAD s/p BMS to LAD, dyslipidemia, ILD followed by Pulmonary, Myositis and HTN. He is here today for followup and is doing well.  He denies any chest pain or pressure, SOB, DOE, PND, orthopnea, LE edema, dizziness, palpitations or syncope. He is compliant with his meds and is tolerating meds with no SE.     Prior CV studies:   The following studies were reviewed today: none  Past Medical History:  Diagnosis Date   Alcohol addiction (HCC)    2010- admitted for withdrawal , detox admission after that   Arrhythmia    Coronary artery disease    95% mid LAD s/p BMS of LAD with aneurysmal segment   Depression    ED (erectile dysfunction)    HLD (hyperlipidemia)    HTN (hypertension)    ILD (interstitial lung disease) (HCC)    Followed by Pulmonary   Mental disorder    Myositis    Peptic ulcer    RBBB 10/24/2015   Past Surgical History:  Procedure Laterality Date   CORONARY STENT PLACEMENT     MUSCLE BIOPSY Left 03/23/2022   Procedure: LEFT THIGH MUSCLE BIOPSY;  Surgeon: Diamantina Monks, MD;  Location: [facility] OR;  Service: General;  Laterality: Left;     Current Meds  Medication Sig   azaTHIOprine (IMURAN) 50 MG tablet Take 3 tablets (150 mg total) by mouth daily.   clopidogrel (PLAVIX) 75 MG tablet TAKE ONE TABLET BY MOUTH ONE TIME DAILY   furosemide (LASIX) 20 MG tablet Take 1 tablet (20 mg total) by mouth as needed for fluid or edema. (Patient taking differently: Take 20 mg by mouth daily as needed for fluid or edema.)   levothyroxine (SYNTHROID) 25 MCG tablet Take 25 mcg by mouth daily in the afternoon.   MELATONIN PO Take 1 tablet by mouth at bedtime as needed (sleep).   metoprolol succinate (TOPROL-XL) 50 MG 24 hr tablet Take  1 tablet (50 mg total) by mouth every evening. Take with or immediately following a meal. (Patient taking differently: Take 50 mg by mouth every evening.)   Multiple Vitamin (MULITIVITAMIN WITH MINERALS) TABS Take 1 tablet by mouth daily.   nitroGLYCERIN (NITROSTAT) 0.4 MG SL tablet Place 1 tablet (0.4 mg total) under the tongue every 5 (five) minutes as needed for chest pain.   predniSONE (DELTASONE) 20 MG tablet Take 1.5 tablets (30 mg total) by mouth daily with breakfast for 30 days, THEN 1 tablet (20 mg total) daily with breakfast. (Patient taking differently: Take 1/2 tablet by mouth two (2) times a day   )     Allergies:   Patient has no known allergies.   Social History   Tobacco Use   Smoking status: Former    Packs/day: 1.00    Years: 30.00    Additional pack years: 0.00    Total pack years: 30.00    Types: Cigarettes    Quit date: 1990    Years since quitting: 34.4    Passive exposure: Past   Smokeless tobacco: Never  Vaping Use   Vaping Use: Never used  Substance Use Topics   Alcohol use: Yes    Alcohol/week: 14.0 standard  drinks of alcohol    Types: 14 Cans of beer per week   Drug use: No     Family Hx: The patient's family history includes Heart attack (age of onset: 8) in his brother; Prostate cancer in his father. There is no history of Anesthesia problems, Hypotension, Malignant hyperthermia, Pseudochol deficiency, Colon cancer, Stomach cancer, Esophageal cancer, or Colon polyps.  ROS:   Please see the history of present illness.     All other systems reviewed and are negative.   Labs/Other Tests and Data Reviewed:    Recent Labs: 03/18/2022: Magnesium 2.0 03/20/2022: B Natriuretic Peptide 121.0 04/08/2022: Pro B Natriuretic peptide (BNP) 98.0 07/16/2022: BUN 28; Creat 0.84; Hemoglobin 13.0; Platelets 271; Potassium 4.9; Sodium 138 07/28/2022: ALT 52   Recent Lipid Panel Lab Results  Component Value Date/Time   CHOL 104 03/31/2022 08:04 AM   TRIG 161  (H) 03/31/2022 08:04 AM   HDL 44 03/31/2022 08:04 AM   CHOLHDL 2.4 03/31/2022 08:04 AM   CHOLHDL 2.2 11/18/2015 07:48 AM   LDLCALC 33 03/31/2022 08:04 AM    Wt Readings from Last 3 Encounters:  08/07/22 198 lb 3.2 oz (89.9 kg)  07/28/22 202 lb 6.4 oz (91.8 kg)  07/16/22 200 lb (90.7 kg)     Objective:    Vital Signs:  BP 128/86   Pulse 66   Ht 5\' 10"  (1.778 m)   Wt 198 lb 3.2 oz (89.9 kg)   SpO2 96%   BMI 28.44 kg/m    GEN: Well nourished, well developed in no acute distress HEENT: Normal NECK: No JVD; No carotid bruits LYMPHATICS: No lymphadenopathy CARDIAC:RRR, no murmurs, rubs, gallops RESPIRATORY:  Clear to auscultation without rales, wheezing or rhonchi  ABDOMEN: Soft, non-tender, non-distended MUSCULOSKELETAL:  No edema; No deformity  SKIN: Warm and dry NEUROLOGIC:  Alert and oriented x 3 PSYCHIATRIC:  Normal affect  ASSESSMENT & PLAN:    1.  ASCAD  -s/p BMS to the LAD -He denies any anginal symptoms since I saw him last -continue prescription drug management Plavix 75mg  daily -off statin due to myositis -stopped Repatha due to cost -not on ASA due to increased bleeding.   2.  HTN  -BP controlled on exam today -continue prescription drug management with Lisinopril 20mg  daily with PRN refills  3.  Hyperlipidemia -LDL goal < 70 -I have personally reviewed and interpreted outside labs performed by patient's PCP which showed LDL 93 and HDL 46 on 08/03/2022 -no statin due to myositis -Repatha was cost prohibitive and stopped taking it 3 months ago -will refer to lipid clinic  4.  Polymyositis -this is a new dx -will check 2D echo to make sure LVF is normal  Medication Adjustments/Labs and Tests Ordered: Current medicines are reviewed at length with the patient today.  Concerns regarding medicines are outlined above.  Tests Ordered: No orders of the defined types were placed in this encounter.  Medication Changes: No orders of the defined types were  placed in this encounter.   Disposition:  Follow up in 1 year(s)  Signed, Armanda Magic, MD  08/07/2022 9:20 AM    [facility]

## 2022-08-07 NOTE — Addendum Note (Signed)
Addended by: Luellen Pucker on: 08/07/2022 09:29 AM   Modules accepted: Orders

## 2022-08-07 NOTE — Patient Instructions (Signed)
Medication Instructions:  Your physician recommends that you continue on your current medications as directed. Please refer to the Current Medication list given to you today.  *If you need a refill on your cardiac medications before your next appointment, please call your pharmacy*   Lab Work: None.  If you have labs (blood work) drawn today and your tests are completely normal, you will receive your results only by: MyChart Message (if you have MyChart) OR A paper copy in the mail If you have any lab test that is abnormal or we need to change your treatment, we will call you to review the results.   Testing/Procedures: Your physician has requested that you have an echocardiogram. Echocardiography is a painless test that uses sound waves to create images of your heart. It provides your doctor with information about the size and shape of your heart and how well your heart's chambers and valves are working. This procedure takes approximately one hour. There are no restrictions for this procedure. Please do NOT wear cologne, perfume, aftershave, or lotions (deodorant is allowed). Please arrive 15 minutes prior to your appointment time.    Follow-Up: At [facility], you and your health needs are our priority.  As part of our continuing mission to provide you with exceptional heart care, we have created designated Provider Care Teams.  These Care Teams include your primary Cardiologist (physician) and Advanced Practice Providers (APPs -  Physician Assistants and Nurse Practitioners) who all work together to provide you with the care you need, when you need it.  We recommend signing up for the patient portal called "MyChart".  Sign up information is provided on this After Visit Summary.  MyChart is used to connect with patients for Virtual Visits (Telemedicine).  Patients are able to view lab/test results, encounter notes, upcoming appointments, etc.  Non-urgent messages can be sent to  your provider as well.   To learn more about what you can do with MyChart, go to [email or web address].    Your next appointment:   1 year(s)  Provider:   Traci Turner, MD     

## 2022-09-03 ENCOUNTER — Ambulatory Visit (HOSPITAL_BASED_OUTPATIENT_CLINIC_OR_DEPARTMENT_OTHER): Payer: PPO

## 2022-09-03 ENCOUNTER — Ambulatory Visit (HOSPITAL_COMMUNITY): Payer: PPO | Attending: Cardiology | Admitting: Pharmacist

## 2022-09-03 ENCOUNTER — Other Ambulatory Visit (HOSPITAL_COMMUNITY): Payer: PPO

## 2022-09-03 DIAGNOSIS — M3320 Polymyositis, organ involvement unspecified: Secondary | ICD-10-CM | POA: Diagnosis not present

## 2022-09-03 DIAGNOSIS — E785 Hyperlipidemia, unspecified: Secondary | ICD-10-CM | POA: Diagnosis not present

## 2022-09-03 DIAGNOSIS — I2510 Atherosclerotic heart disease of native coronary artery without angina pectoris: Secondary | ICD-10-CM

## 2022-09-03 LAB — ECHOCARDIOGRAM COMPLETE
Area-P 1/2: 3.46 cm2
S' Lateral: 2.7 cm

## 2022-09-03 MED ORDER — EZETIMIBE 10 MG PO TABS: 10.0000 mg | ORAL_TABLET | Freq: Every day | ORAL | 3 refills | Status: DC

## 2022-09-03 NOTE — Patient Instructions (Signed)
Start taking ezetimibe 10mg  daily Follow up labs 8/30. Lab opens at 7:15 AM  Please call me at [phone] with any questions

## 2022-09-03 NOTE — Progress Notes (Signed)
Patient ID: Sean [name]                 DOB: [birth year]                    MRN: [id number]      HPI: Sean [name] is a 72 y.o. male patient of Dr. Mayford Knife referred to lipid clinic by Eligha Bridegroom, NP. PMH is significant for CAD s/p remote BMS to LAD on Plavix, RBBB, HTN, HLD, prior alcohol dependence, and elevated liver enzymes.  At cardiology clinic visit 05/21/2021 LDL was elevated. He admitted to not taking Zetia 10 mg daily. This was reinitiated in addition to atorvastatin and LDL improved. Due to elevated LFTs, he was switched to rosuvastatin and advised to decrease alcohol consumption. At appointment with Marcelino Duster 12/7 he reported he had decreased alcohol intake but was still drinking 1 large and 1 small IPA per day. LFT continued to increase despite change to rosuvastatin. Rosuvastatin and ezetimibe was stopped. He was started on Repatha. Referred to GI, pulmonary and rheumatology.  After a thorough workup, patient was diagnosed with inflammatory myositis and interstital lung disease. He is being treated with azathioprine and prednisone. He stopped Repatha due to cost.   Patient presents today to clinic.  He states that he has been trying to decrease the amount of medications that he takes.  Stopped Repatha not because he could not afford it but because he saw how much it was costing him and the insurance and did not like it.  He is doing much better from a myositis standpoint.  He tries to do something active most days.  He is not as strong as he would like to be but he has seen significant improvement since October of last year.  LFTs have improved but AST is still elevated.  Patient does not have any interest in stopping alcohol.  He is drinking about 30 ounces of a 9% IPA daily.  Which is significantly less than in the past.  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  We discussed the importance of treating his cholesterol given his history of CAD.  We discussed that ultimately he is  the patient and the decision is purely up to him but my job is to give him all of the information to make an informed decision.  Patient interested in getting a baseline lipid panel today.  We talked about his LDL-C goal of less than 70.  Cannot use statins due to his myositis.  Patient does not want to pay for Repatha.  Financially he would not qualify for any grants nor would patient want to except even if he did qualify.  Leqvio also extremely expensive and not covered well at all by his insurance.  We discussed that we could use ezetimibe although will be significantly less effective than Repatha.  Current Medications: none Intolerances: atorvastatin 80mg , rosuvastatin 40mg , ezetimibe (increased LFT) Risk Factors: CAD, HTN LDL-C goal: <70 ApoB goal: <80  Diet:  Breakfast: egg, cereal Admits too much salt, not much fried food Loves beef- doesn't limit Eats more processed meat  Lots of vegetables and fruits  Exercise: walks most days about a mile  Family History:  Family History  Problem Relation Age of Onset   Prostate cancer Father    Heart attack Brother 64   Anesthesia problems Neg Hx    Hypotension Neg Hx    Malignant hyperthermia Neg Hx    Pseudochol deficiency Neg Hx  Colon cancer Neg Hx    Stomach cancer Neg Hx    Esophageal cancer Neg Hx    Colon polyps Neg Hx     Social History: 30oz 9% IPA per day, no tobacco  Labs: Lipid Panel     Component Value Date/Time   CHOL 104 03/31/2022 0804   TRIG 161 (H) 03/31/2022 0804   HDL 44 03/31/2022 0804   CHOLHDL 2.4 03/31/2022 0804   CHOLHDL 2.2 11/18/2015 0748   VLDL 13 11/18/2015 0748   LDLCALC 33 03/31/2022 0804   LABVLDL 27 03/31/2022 0804    Past Medical History:  Diagnosis Date   Alcohol addiction (HCC)    2010- admitted for withdrawal , detox admission after that   Arrhythmia    Coronary artery disease    95% mid LAD s/p BMS of LAD with aneurysmal segment   Depression    ED (erectile dysfunction)     HLD (hyperlipidemia)    HTN (hypertension)    ILD (interstitial lung disease) (HCC)    Followed by Pulmonary   Mental disorder    Myositis    Peptic ulcer    RBBB 10/24/2015    Current Outpatient Medications on File Prior to Visit  Medication Sig Dispense Refill   azaTHIOprine (IMURAN) 50 MG tablet Take 3 tablets (150 mg total) by mouth daily. 90 tablet 2   clopidogrel (PLAVIX) 75 MG tablet TAKE ONE TABLET BY MOUTH ONE TIME DAILY 90 tablet 0   furosemide (LASIX) 20 MG tablet Take 1 tablet (20 mg total) by mouth as needed for fluid or edema. (Patient taking differently: Take 20 mg by mouth daily as needed for fluid or edema.) 90 tablet 3   levothyroxine (SYNTHROID) 25 MCG tablet Take 25 mcg by mouth daily in the afternoon.     MELATONIN PO Take 1 tablet by mouth at bedtime as needed (sleep).     metoprolol succinate (TOPROL-XL) 50 MG 24 hr tablet Take 1 tablet (50 mg total) by mouth every evening. Take with or immediately following a meal. (Patient taking differently: Take 50 mg by mouth every evening.) 90 tablet 3   Multiple Vitamin (MULITIVITAMIN WITH MINERALS) TABS Take 1 tablet by mouth daily.     nitroGLYCERIN (NITROSTAT) 0.4 MG SL tablet Place 1 tablet (0.4 mg total) under the tongue every 5 (five) minutes as needed for chest pain. 25 tablet 3   pantoprazole (PROTONIX) 40 MG tablet Take 1 tablet (40 mg total) by mouth daily. 30 tablet 0   No current facility-administered medications on file prior to visit.    No Known Allergies  Assessment/Plan:  1. Hyperlipidemia -  HLD (hyperlipidemia) Assessment: LDL-C above goal less than 70.  Patient is not on any cholesterol medications.  Getting new baseline today Cannot use statins due to myositis Previously on Repatha with very good results however patient does not want to pay for it nor does she agree with how high the cost of the medication is Patient remaining active Still drinking more alcohol than recommended per guidelines but  much less than in the past Only inexpensive medication options will be to treat with ezetimibe  Plan: Get baseline lipid panel today Start ezetimibe 10 mg daily Will be on the more cautious side and check LFTs and lipid in 6 weeks He will have his CK checked with rheumatology at the end of August    Thank you,  Olene Floss, Pharm.D, BCACP, BCPS, CPP [facility] [place]  [facility] [address], [place], [place] [address]  Phone: [phone]; Fax: [phone]

## 2022-09-03 NOTE — Assessment & Plan Note (Signed)
Assessment: LDL-C above goal less than 70.  Patient is not on any cholesterol medications.  Getting new baseline today Cannot use statins due to myositis Previously on Repatha with very good results however patient does not want to pay for it nor does she agree with how high the cost of the medication is Patient remaining active Still drinking more alcohol than recommended per guidelines but much less than in the past Only inexpensive medication options will be to treat with ezetimibe  Plan: Get baseline lipid panel today Start ezetimibe 10 mg daily Will be on the more cautious side and check LFTs and lipid in 6 weeks He will have his CK checked with rheumatology at the end of August

## 2022-09-04 ENCOUNTER — Encounter: Payer: Self-pay | Admitting: Cardiology

## 2022-09-04 ENCOUNTER — Telehealth: Payer: Self-pay

## 2022-09-04 DIAGNOSIS — I77810 Thoracic aortic ectasia: Secondary | ICD-10-CM

## 2022-09-04 LAB — LIPID PANEL
Chol/HDL Ratio: 5 ratio (ref 0.0–5.0)
Cholesterol, Total: 181 mg/dL (ref 100–199)
HDL: 36 mg/dL — ABNORMAL LOW (ref >39)
LDL Chol Calc (NIH): 115 mg/dL — ABNORMAL HIGH (ref 0–99)
Triglycerides: 168 mg/dL — ABNORMAL HIGH (ref 0–149)
VLDL Cholesterol Cal: 30 mg/dL (ref 5–40)

## 2022-09-04 NOTE — Telephone Encounter (Signed)
Called, no answer, left detailed message per DPR that 2D echo showed normal heart function with mildly thickened heart muscle, trivial leakiness of mitral valve, mildly enlarged RA and mildly calcified AV with no AS and mildly dilated aortic root.  Advised that Dr. Mayford Knife would like to repeat echo in 1 year for midly dilated aortic root. Order placed, advised patient that echo dept will call to schedule.

## 2022-09-04 NOTE — Telephone Encounter (Signed)
-----   Message from Armanda Magic sent at 09/04/2022  8:16 AM EDT ----- 2D echo showed normal heart function with mildly thickened heart muscle, trivial leakiness of mitral valve, mildly enlarged RA and mildly calcified AV with no AS and mildly dilated aortic root.  Repeat echo in 1 year for midly dilated aortic root

## 2022-09-16 ENCOUNTER — Telehealth: Payer: Self-pay

## 2022-09-16 NOTE — Telephone Encounter (Signed)
Submitted a Prior Authorization request to  [employer]  for  Imuran  via CoverMyMeds. Will update once we receive a response.

## 2022-09-18 ENCOUNTER — Encounter (HOSPITAL_BASED_OUTPATIENT_CLINIC_OR_DEPARTMENT_OTHER): Payer: Self-pay

## 2022-09-25 ENCOUNTER — Encounter (HOSPITAL_BASED_OUTPATIENT_CLINIC_OR_DEPARTMENT_OTHER): Payer: Self-pay

## 2022-09-29 ENCOUNTER — Other Ambulatory Visit: Payer: Self-pay

## 2022-09-29 ENCOUNTER — Encounter: Payer: Self-pay | Admitting: Internal Medicine

## 2022-09-29 ENCOUNTER — Ambulatory Visit (INDEPENDENT_AMBULATORY_CARE_PROVIDER_SITE_OTHER): Payer: PPO | Admitting: Internal Medicine

## 2022-09-29 ENCOUNTER — Ambulatory Visit: Payer: PPO | Admitting: Internal Medicine

## 2022-09-29 VITALS — BP 108/60 | HR 78 | Ht 70.0 in | Wt 202.8 lb

## 2022-09-29 DIAGNOSIS — J8489 Other specified interstitial pulmonary diseases: Secondary | ICD-10-CM

## 2022-09-29 DIAGNOSIS — I7300 Raynaud's syndrome without gangrene: Secondary | ICD-10-CM | POA: Diagnosis not present

## 2022-09-29 DIAGNOSIS — R748 Abnormal levels of other serum enzymes: Secondary | ICD-10-CM

## 2022-09-29 DIAGNOSIS — M359 Systemic involvement of connective tissue, unspecified: Secondary | ICD-10-CM | POA: Diagnosis not present

## 2022-09-29 DIAGNOSIS — Z87891 Personal history of nicotine dependence: Secondary | ICD-10-CM | POA: Diagnosis not present

## 2022-09-29 DIAGNOSIS — R7401 Elevation of levels of liver transaminase levels: Secondary | ICD-10-CM | POA: Diagnosis not present

## 2022-09-29 LAB — PULMONARY FUNCTION TEST
DL/VA % pred: 97 %
DL/VA: 3.93 ml/min/mmHg/L
DLCO cor % pred: 72 %
DLCO cor: 18.62 ml/min/mmHg
DLCO unc % pred: 72 %
DLCO unc: 18.62 ml/min/mmHg
FEF 25-75 Pre: 1.64 L/sec
FEF2575-%Pred-Pre: 67 %
FEV1-%Pred-Pre: 62 %
FEV1-Pre: 2.02 L
FEV1FVC-%Pred-Pre: 96 %
FEV6-%Pred-Pre: 68 %
FEV6-Pre: 2.82 L
FEV6FVC-%Pred-Pre: 106 %
FVC-%Pred-Pre: 64 %
FVC-Pre: 2.83 L
Pre FEV1/FVC ratio: 71 %
Pre FEV6/FVC Ratio: 100 %

## 2022-09-29 NOTE — Patient Instructions (Signed)
Spiro/DLCO performed today. 

## 2022-09-29 NOTE — Patient Instructions (Addendum)
ICD-10-CM   1. Interstitial lung disease due to connective tissue disease (HCC)  J84.89    M35.9     2. DOE (dyspnea on exertion)  R06.09     3. Myositis of lower extremity, unspecified laterality, unspecified myositis type  M60.9     4. Raynaud's phenomenon without gangrene  I73.00     5. History of smoking 30 or more pack years  Z87.891     6. Coronary artery calcification seen on CAT scan  I25.10     7. Aortic valve calcification  I35.9        Interstitial lung disease due to connective tissue disease (HCC) DOE (dyspnea on exertion) Myositis of lower extremity, unspecified laterality, unspecified myositis type Raynaud's phenomenon without gangrene High Risk Medication use    - ILD is clinically stable based on symptoms but breathing test shows number are down - not sure if this is just varation or truly down because radiology in June 2024 burden of ILD was mild and < 10%  -  CK improving as of May 2024 and down to 500s from 6700 in Jan 2024 -LFT slightly high in May 2024 and June 2024 and concre for drug induced liver injury with alcohol  Plan -  Hold off anti-fibrotic for now - I will recommend antifibrotic's if the pulmonary function test at follow-up is not improving or is getting worse or if the severity of lung disease burden is greater than 10-20% provided liver is normal  - with intenti for future anti-fibrotic - -Recheck  CK and liver function test today 09/29/2022   - do spirometry and dlco in 2-3 months  History of smoking 30 or more pack years.Quit > 30 years ago  - no evidence of lung cancer June 2024  Plan  - capture lung cancer screening on CT chest for ILD through age 37 per ACS guidelnes  Coronary artery calcification seen on CAT scan Aortic valve calcification  - last echo dec 2023 - normal  - last Stress test 2014 and dec 2023 without ischemia  Plan  - per cardiology  Vaccine counseling  Plan  - Recommend RSV vaccine  Alcohol  use High LFT   Plan - Check right upper quadrant ultrasound   Followup  - 8 - 12 weeks after spirometry 30-minute visit with Dr. Marchelle Gearing

## 2022-09-29 NOTE — Progress Notes (Signed)
IOV 03/11/22 - Patient of Dr Tonia Brooms   This is a 72 year old gentleman, history of alcohol abuse, coronary artery disease, depression, hypertension, hyperlipidemia.  Patient is a former smoker quit in 19 90, 34-pack-year history. He trys to walk a lot. He has dogs that he walks regularly. Over the past 4 months he has had increased sob and fatigue. Distances and inclines make walking worse. He saw cardiology and they did several studies and everything seemed fine. He was sent to GI for evaluation for elevated LFTs, still drinking alcohol daily.  He feels like he is becoming weaker and weaker over the past couple of months.  He is finding that he is having pains in his muscles and when he describes this he points to locations in large muscle groups and side of the thigh as well as shoulders.  He is also noticed ongoing weight loss.  He also has recurrent episodes of Raynaud's of the hands.  OV 03/18/2022: Here today for evaluation of recent abnormal labs.  His CK is elevated, aldolase elevated, ANA titer was positive.  For some reason his myositis antibody panel was canceled.  04/08/2022 Patient was seen last month for shortness of breath/dyspnea symptoms. He was dx with inflammatory myositis recently. Currently on prednisone 60mg  and following with rheumatology.   No issues with shortness of breath. Congestion is a lot better since being discharged from the hospital. His main complaint is muscle fatigue. He first noticed weakness last spring. He has no muscle pain except for chronic lower back pain. He has seen some improvement in his functioning since starting steroids. He is able to do a little bit more than he could last week. He is getting physical therapy. After therapy he was tired for the rest of the day.   He also has generalized fluid retention that started before he began taking high dose steroids, he increased lasix 40mg  for a couple of days. He has cut back on alcohol but still having 2-3  drinks every couple of days. Following with Dr. Allena Katz with neurology , planning for EMG and NCS.   OV 06/15/2022: Doing well today.  He feels so much better.  He is stronger.  Tolerating his Imuran plus prednisone.  He had pulmonary function test completed which reviewed today in the office which shows a reduced FVC.  His DLCO however is normal.  He does have some crackles on exam has not had any axial CT imaging.  Still feels dyspneic when he exerts himself.  OV 07/28/2022 -transfer of care from Dr. Elige Radon Icard to Dr. Marchelle Gearing at the ILD center  Subjective:  Patient ID: [name], male , DOB: [birth year] , age 77 y.o. , MRN: [id number] , ADDRESS: [address] [place] [address] PCP [name], MD Patient Care Team: [name], MD as PCP - General (Family Medicine) Quintella Reichert, MD as PCP - Cardiology (Cardiology)  This Provider for this visit: Treatment Team:  Attending Provider: Kalman Shan, MD    07/28/2022 -   Chief Complaint  Patient presents with   Follow-up    F/up on Myositis, ILD     HPI [name] 72 y.o. -I am meeting him for the first time.  History is gained from talking to him. y.  History is also gained from review of the external records that include cardiology in December 2023 and rheumatology in February 2024 and April 2024.  He tells me that a year ago he started noticing  some difficulty standing up but in October 2023 while walking the dog he became extremely fatigued.  He was admitted to the hospital after that for 6 days.  He received IV steroids.  He says after that he established with Dr. Audie Box.  He has been on prednisone since early 2024.  He established with Dr. Sheliah Hatch and rheumatology.  Has been given a diagnosis of myositis.  He was also started on azathioprine.  Currently is on prednisone taper.  The plan is to get him to half tablet per day [?  Milligram] within the next 2 months.  He is tolerating these  medications fine.  Has become a little cushingoid.  He states he is much better but still weak.  His current symptom scores are below.  He does have a mild cough but it is is clearing of the throat.  This is chronic.  He is noticed to be on ACE inhibitor's.  He did return from a [place] cruise 2 weeks ago and got sick but he is now recovered.  The cough is gone away from that.     Myositis : Saw Dr Orest Dikes rhem 06/03/22  and 2/8/244 -external record reviwd:  Coronary arter calcification:CAD/ Has hx of remote bare metal strength.  SAw NP 01/23/23 [place] for cardiollogy: This extrenal record reviewed.  Toprol started. Echo ordered was normal . Advised to continue plavix.  Last stress test 2014 -> . Had PET CT 12/19/3: results reviewed. No evidenc of ischemia  Alcoholosm: He says he was dependent on alcohol in the past but currently is not but he still drinks 3-4 beers a day.  He has never had a right upper quadrant ultrasound.  He says he is not currently addicted but he does drink daily.  He does not think he have a withdrawal if he quits.  CT Chest data - HRCT 07/14/22 - indepenently visualized, revewied and independently conclude with findings below -> although I personally find the disease burden to be less than 10%.  I did find a prior CT abdomen lung image.  It appears that this ILD is new compared to last year.  I personally contacted Dr. Allegra Lai via email and she replied back.  She is going to review the image again.  Narrative & Impression  CLINICAL DATA:  72 year old male with history of myositis. Evaluate for interstitial lung disease.   EXAM: CT CHEST WITHOUT CONTRAST   TECHNIQUE: Multidetector CT imaging of the chest was performed following the standard protocol without intravenous contrast. High resolution imaging of the lungs, as well as inspiratory and expiratory imaging, was performed.   RADIATION DOSE REDUCTION: This exam was performed according to the departmental  dose-optimization program which includes automated exposure control, adjustment of the mA and/or kV according to patient size and/or use of iterative reconstruction technique.   COMPARISON:  No priors.   FINDINGS: Cardiovascular: Heart size is normal. There is no significant pericardial fluid, thickening or pericardial calcification. There is aortic atherosclerosis, as well as atherosclerosis of the great vessels of the mediastinum and the coronary arteries, including calcified atherosclerotic plaque in the left main, left anterior descending, left circumflex and right coronary arteries. Calcifications of the aortic valve.   Mediastinum/Nodes: No pathologically enlarged mediastinal or hilar lymph nodes. Please note that accurate exclusion of hilar adenopathy is limited on noncontrast CT scans. Esophagus is unremarkable in appearance. No axillary lymphadenopathy.   Lungs/Pleura: High-resolution images demonstrate some mild ground-glass attenuation and septal thickening, most evident  in the lung bases bilaterally. Minimal subpleural reticulation. No traction bronchiectasis or honeycombing. Inspiratory and expiratory imaging demonstrates mild air trapping indicative of mild small airways disease. No acute consolidative airspace disease. No pleural effusions.   Upper Abdomen: Aortic atherosclerosis.   Musculoskeletal: There are no aggressive appearing lytic or blastic lesions noted in the visualized portions of the skeleton.   IMPRESSION: 1. The appearance of the lungs suggests early changes of interstitial lung disease, with a spectrum of findings at this time categorized as indeterminate for usual interstitial pneumonia (UIP) per current ATS guidelines. Repeat high-resolution chest CT is suggested in 12 months to assess for temporal changes in the appearance of the lung parenchyma. 2. Aortic atherosclerosis, in addition to left main and three-vessel coronary artery disease.  Assessment for potential risk factor modification, dietary therapy or pharmacologic therapy may be warranted, if clinically indicated. 3. There are calcifications of the aortic valve. Echocardiographic correlation for evaluation of potential valvular dysfunction may be warranted if clinically indicated.   Aortic Atherosclerosis (ICD10-I70.0).     Electronically Signed   By: Trudie Reed M.D.   On: 07/18/2022 07:42        OV 09/29/2022  Subjective:  Patient ID: [name], male , DOB: 1950-08-20 , age 49 y.o. , MRN: [id number] , ADDRESS: [address] [place] [address] PCP [name], MD Patient Care Team: [name], MD as PCP - General (Family Medicine) Quintella Reichert, MD as PCP - Cardiology (Cardiology)  This Provider for this visit: Treatment Team:  Attending Provider: Kalman Shan, MD    09/29/2022 -   Chief Complaint  Patient presents with   Follow-up    F/up on PFT     HPI [name] 72 y.o. -myositis with ILD follow-up.  After his last visit I got an email from Dr. Dorothey Baseman, ccording to Dr. Allegra Lai thoracic radiologist: In an email; , "this would be consistent with early NSIP,  but I do not see definite traction bronchiectasis and could also be mild infect/inflam pneumonitis. Findings are new from Feb 11, 2022.  Involvement is less than 10%."  He tells me now that he continues to do stable.  In fact dyspnea symptom score slightly better.  He says [place].  He is able to climb 3 levels holding the guardrail but without stopping.  He spent a week there.  He felt his effort tolerance was way better than it was in January 2024.  Therefore he feels he is improved.  He is weaning down on prednisone.  He has upcoming appointment Dr. Sheliah Hatch and he believes he will be weaned off prednisone.  He continues Imuran at 150 mg daily.  He is tolerating this well.  Review  of the labs indicate that his CK in May 2024 was 500s.  His liver enzymes continue to be high even in June 2024.  I thought I ordered a right upper quadrant ultrasound but I do not see this being done.  Will reorder this today.  He is known to have alcohol intake in the past.  He did pulmonary function test today and shows a slight decline compared to spring 2024.  During this time his been on steroids and Imuran.  I do not know if this is a true decrease or a variation.  This because his symptoms are stable.  He has a true decline and he needs change of the  Imuran to CellCept or another immunomodulator and definitely will need to add antifibrotic.  However his abnormal liver function test will preclude adding an antifibrotic.  Will get this rechecked today.  Repeat sit stand hypxoemia test is stable  SYMPTOM SCALE - ILD 07/28/2022 09/29/2022   Current weight    O2 use ra ra  Shortness of Breath 0 -> 5 scale with 5 being worst (score 6 If unable to do)   At rest 0 0  Simple tasks - showers, clothes change, eating, shaving 0 0  Household (dishes, doing bed, laundry) 0 0  Shopping 1 0  Walking level at own pace 1 1  Walking up Stairs 2 2  Total (30-36) Dyspnea Score 4 3  How bad is your cough? 1 -ACE inhibitor. 1  How bad is your fatigue 2 2  How bad is nausea 0 0  How bad is vomiting?  0 0  How bad is diarrhea? 2 1  How bad is anxiety? 1 1  How bad is depression 1 0  Any chronic pain - if so where and how bad x x    Simple office walk 224 (66+46 x 2) feet Pod A at [facility] x  3 laps goal with forehead probe 07/28/2022  09/29/2022 t 09/29/2022   O2 used ra Ra - finger Ra forehead probel  Number laps completed 15 sit stand 15 sit stand 15  Comments about pace good good   Resting Pulse Ox/HR 98% and 69/min 98% and HR 70 100%a ahd HR 74  Final Pulse Ox/HR 96% and 83/min 96% (dropped to 80% x 1 but trace was bad) 99% and HR 86  Desaturated </= 88% no no   Desaturated <= 3% points no no    Got Tachycardic >/= 90/min no no   Symptoms at end of test none Level 2 dyspnea   Miscellaneous comments none      CT Chest data from date: n*   According to Dr. Allegra Lai thoracic radiologist: In an email; , "this would be consistent with early NSIP,  but I do not see definite traction bronchiectasis and could also be mild infect/inflam pneumonitis. Findings are new from Feb 11, 2022.  Involvement is less than 10%."  PFT    Latest Ref Rng & Units 09/29/2022   10:25 AM 06/04/2022   11:25 AM  PFT Results  FVC-Pre L 2.83  P 3.00   FVC-Predicted Pre % 64  P 68   FVC-Post L  2.95   FVC-Predicted Post %  67   Pre FEV1/FVC % % 71  P 81   Post FEV1/FCV % %  84   FEV1-Pre L 2.02  P 2.43   FEV1-Predicted Pre % 62  P 75   FEV1-Post L  2.47   DLCO uncorrected ml/min/mmHg 18.62  P 21.96   DLCO UNC% % 72  P 85   DLCO corrected ml/min/mmHg 18.62  P 22.28   DLCO COR %Predicted % 72  P 86   DLVA Predicted % 97  P 123   TLC L  5.25   TLC % Predicted %  74   RV % Predicted %  86     P Preliminary result     LAB RESULTS last 96 hours No results found.  LAB RESULTS last 90 days Recent Results (from the past 2160 hour(s))  CBC with Differential/Platelet     Status: Abnormal   Collection Time: 07/16/22  2:41 PM  Result Value Ref  Range   WBC 8.8 3.8 - 10.8 Thousand/uL   RBC 3.97 (L) 4.20 - 5.80 Million/uL   Hemoglobin 13.0 (L) 13.2 - 17.1 g/dL   HCT 03.4 74.2 - 59.5 %   MCV 99.0 80.0 - 100.0 fL   MCH 32.7 27.0 - 33.0 pg   MCHC 33.1 32.0 - 36.0 g/dL   RDW 63.8 (H) 75.6 - 43.3 %   Platelets 271 140 - 400 Thousand/uL   MPV 10.5 7.5 - 12.5 fL   Neutro Abs 6,890 1,500 - 7,800 cells/uL   Lymphs Abs 1,188 850 - 3,900 cells/uL   Absolute Monocytes 642 200 - 950 cells/uL   Eosinophils Absolute 18 15 - 500 cells/uL   Basophils Absolute 62 0 - 200 cells/uL   Neutrophils Relative % 78.3 %   Total Lymphocyte 13.5 %   Monocytes Relative 7.3 %   Eosinophils Relative 0.2 %   Basophils  Relative 0.7 %   Smear Review      Comment: Review of peripheral smear confirms automated results.   CK     Status: Abnormal   Collection Time: 07/16/22  2:41 PM  Result Value Ref Range   Total CK 568 (H) 44 - 196 U/L  COMPLETE METABOLIC PANEL WITH GFR     Status: Abnormal   Collection Time: 07/16/22  2:41 PM  Result Value Ref Range   Glucose, Bld 116 (H) 65 - 99 mg/dL    Comment: .            Fasting reference interval . For someone without known diabetes, a glucose value between 100 and 125 mg/dL is consistent with prediabetes and should be confirmed with a follow-up test. .    BUN 28 (H) 7 - 25 mg/dL   Creat 2.95 1.88 - 4.16 mg/dL   eGFR 93 > OR = 60 SA/YTK/1.60F0   BUN/Creatinine Ratio 33 (H) 6 - 22 (calc)   Sodium 138 135 - 146 mmol/L   Potassium 4.9 3.5 - 5.3 mmol/L   Chloride 100 98 - 110 mmol/L   CO2 30 20 - 32 mmol/L   Calcium 9.4 8.6 - 10.3 mg/dL   Total Protein 5.9 (L) 6.1 - 8.1 g/dL   Albumin 3.7 3.6 - 5.1 g/dL   Globulin 2.2 1.9 - 3.7 g/dL (calc)   AG Ratio 1.7 1.0 - 2.5 (calc)   Total Bilirubin 0.4 0.2 - 1.2 mg/dL   Alkaline phosphatase (APISO) 57 35 - 144 U/L   AST 49 (H) 10 - 35 U/L   ALT 59 (H) 9 - 46 U/L  Hepatic function panel     Status: Abnormal   Collection Time: 07/28/22 10:25 AM  Result Value Ref Range   Total Bilirubin 0.6 0.2 - 1.2 mg/dL   Bilirubin, Direct 0.1 0.0 - 0.3 mg/dL   Alkaline Phosphatase 52 39 - 117 U/L   AST 52 (H) 0 - 37 U/L   ALT 52 0 - 53 U/L   Total Protein 6.2 6.0 - 8.3 g/dL   Albumin 3.9 3.5 - 5.2 g/dL  Lab report - scanned     Status: None   Collection Time: 08/03/22  9:38 AM  Result Value Ref Range   EGFR 92.0     Comment: Abstracted by HIM  ECHOCARDIOGRAM COMPLETE     Status: None   Collection Time: 09/03/22  7:50 AM  Result Value Ref Range   Area-P 1/2 3.46 cm2   S' Lateral 2.70 cm   Est EF 55 -  60%   Lipid panel     Status: Abnormal   Collection Time: 09/03/22  9:12 AM  Result Value Ref Range    Cholesterol, Total 181 100 - 199 mg/dL   Triglycerides 324 (H) 0 - 149 mg/dL   HDL 36 (L) >40 mg/dL   VLDL Cholesterol Cal 30 5 - 40 mg/dL   LDL Chol Calc (NIH) 102 (H) 0 - 99 mg/dL   Chol/HDL Ratio 5.0 0.0 - 5.0 ratio    Comment:                                   T. Chol/HDL Ratio                                             Men  Women                               1/2 Avg.Risk  3.4    3.3                                   Avg.Risk  5.0    4.4                                2X Avg.Risk  9.6    7.1                                3X Avg.Risk 23.4   11.0   Pulmonary function test     Status: None (Preliminary result)   Collection Time: 09/29/22 10:25 AM  Result Value Ref Range   FVC-Pre 2.83 L   FVC-%Pred-Pre 64 %   FEV1-Pre 2.02 L   FEV1-%Pred-Pre 62 %   FEV6-Pre 2.82 L   FEV6-%Pred-Pre 68 %   Pre FEV1/FVC ratio 71 %   FEV1FVC-%Pred-Pre 96 %   Pre FEV6/FVC Ratio 100 %   FEV6FVC-%Pred-Pre 106 %   FEF 25-75 Pre 1.64 L/sec   FEF2575-%Pred-Pre 67 %   DLCO unc 18.62 ml/min/mmHg   DLCO unc % pred 72 %   DLCO cor 18.62 ml/min/mmHg   DLCO cor % pred 72 %   DL/VA 7.25 ml/min/mmHg/L   DL/VA % pred 97 %         has a past medical history of Alcohol addiction (HCC), Aortic root dilatation (HCC), Arrhythmia, Coronary artery disease, Depression, ED (erectile dysfunction), HLD (hyperlipidemia), HTN (hypertension), ILD (interstitial lung disease) (HCC), Mental disorder, Myositis, Peptic ulcer, and RBBB (10/24/2015).   reports that he quit smoking about 34 years ago. His smoking use included cigarettes. He started smoking about 64 years ago. He has a 30 pack-year smoking history. He has been exposed to tobacco smoke. He has never used smokeless tobacco.  Past Surgical History:  Procedure Laterality Date   CORONARY STENT PLACEMENT     MUSCLE BIOPSY Left 03/23/2022   Procedure: LEFT THIGH MUSCLE BIOPSY;  Surgeon: Diamantina Monks, MD;  Location: [facility] OR;  Service: General;  Laterality: Left;     No Known Allergies  Immunization History  Administered  Date(s) Administered   Fluad Quad(high Dose 65+) 10/20/2021   PFIZER(Purple Top)SARS-COV-2 Vaccination 04/01/2019    Family History  Problem Relation Age of Onset   Prostate cancer Father    Heart attack Brother 6   Anesthesia problems Neg Hx    Hypotension Neg Hx    Malignant hyperthermia Neg Hx    Pseudochol deficiency Neg Hx    Colon cancer Neg Hx    Stomach cancer Neg Hx    Esophageal cancer Neg Hx    Colon polyps Neg Hx      Current Outpatient Medications:    azaTHIOprine (IMURAN) 50 MG tablet, Take 3 tablets (150 mg total) by mouth daily., Disp: 90 tablet, Rfl: 2   clopidogrel (PLAVIX) 75 MG tablet, TAKE ONE TABLET BY MOUTH ONE TIME DAILY, Disp: 90 tablet, Rfl: 0   ezetimibe (ZETIA) 10 MG tablet, Take 1 tablet (10 mg total) by mouth daily., Disp: 90 tablet, Rfl: 3   furosemide (LASIX) 20 MG tablet, Take 1 tablet (20 mg total) by mouth as needed for fluid or edema. (Patient taking differently: Take 20 mg by mouth daily as needed for fluid or edema.), Disp: 90 tablet, Rfl: 3   levothyroxine (SYNTHROID) 25 MCG tablet, Take 25 mcg by mouth daily in the afternoon., Disp: , Rfl:    MELATONIN PO, Take 1 tablet by mouth at bedtime as needed (sleep)., Disp: , Rfl:    metoprolol succinate (TOPROL-XL) 50 MG 24 hr tablet, Take 1 tablet (50 mg total) by mouth every evening. Take with or immediately following a meal. (Patient taking differently: Take 50 mg by mouth every evening.), Disp: 90 tablet, Rfl: 3   Multiple Vitamin (MULITIVITAMIN WITH MINERALS) TABS, Take 1 tablet by mouth daily., Disp: , Rfl:    nitroGLYCERIN (NITROSTAT) 0.4 MG SL tablet, Place 1 tablet (0.4 mg total) under the tongue every 5 (five) minutes as needed for chest pain., Disp: 25 tablet, Rfl: 3   predniSONE (DELTASONE) 20 MG tablet, Take 10 mg by mouth daily with breakfast. Takes 10 MG daily, Disp: , Rfl:    pantoprazole (PROTONIX) 40 MG tablet, Take 1  tablet (40 mg total) by mouth daily., Disp: 30 tablet, Rfl: 0      Objective:   Vitals:   09/29/22 1440  BP: 108/60  Pulse: 78  SpO2: 96%  Weight: 202 lb 12.8 oz (92 kg)  Height: 5\' 10"  (1.778 m)    Estimated body mass index is 29.1 kg/m as calculated from the following:   Height as of this encounter: 5\' 10"  (1.778 m).   Weight as of this encounter: 202 lb 12.8 oz (92 kg).  @WEIGHTCHANGE @  [employer]   09/29/22 1440  Weight: 202 lb 12.8 oz (92 kg)     Physical Exam   General: No distress. cushingoid O2 at rest: no Cane present: no Sitting in wheel chair: no Frail: no Obese: no Neuro: Alert and Oriented x 3. GCS 15. Speech normal Psych: Pleasant Resp:  Barrel Chest - no.  Wheeze - no, Crackles - YES BASE, No overt respiratory distress CVS: Normal heart sounds. Murmurs - no Ext: Stigmata of Connective Tissue Disease - RAYNAOD . Thick fingers HEENT: Normal upper airway. PEERL +. No post nasal drip        Assessment:       ICD-10-CM   1. Interstitial lung disease due to connective tissue disease (HCC)  J84.89    M35.9     2. Raynaud's phenomenon without gangrene  I73.00  3. History of smoking 30 or more pack years  Z87.891     4. Elevated transaminase level  R74.01          Plan:     Patient Instructions     ICD-10-CM   1. Interstitial lung disease due to connective tissue disease (HCC)  J84.89    M35.9     2. DOE (dyspnea on exertion)  R06.09     3. Myositis of lower extremity, unspecified laterality, unspecified myositis type  M60.9     4. Raynaud's phenomenon without gangrene  I73.00     5. History of smoking 30 or more pack years  Z87.891     6. Coronary artery calcification seen on CAT scan  I25.10     7. Aortic valve calcification  I35.9        Interstitial lung disease due to connective tissue disease (HCC) DOE (dyspnea on exertion) Myositis of lower extremity, unspecified laterality, unspecified myositis type Raynaud's  phenomenon without gangrene High Risk Medication use    - ILD is clinically stable based on symptoms but breathing test shows number are down - not sure if this is just varation or truly down because radiology in June 2024 burden of ILD was mild and < 10%  -  CK improving as of May 2024 and down to 500s from 6700 in Jan 2024 -LFT slightly high in May 2024 and June 2024 and concre for drug induced liver injury with alcohol  Plan -  Hold off anti-fibrotic for now - I will recommend antifibrotic's if the pulmonary function test at follow-up is not improving or is getting worse or if the severity of lung disease burden is greater than 10-20% provided liver is normal  - with intenti for future anti-fibrotic - -Recheck  CK and liver function test today 09/29/2022   - do spirometry and dlco in 2-3 months  History of smoking 30 or more pack years.Quit > 30 years ago  - no evidence of lung cancer June 2024  Plan  - capture lung cancer screening on CT chest for ILD through age 45 per ACS guidelnes  Coronary artery calcification seen on CAT scan Aortic valve calcification  - last echo dec 2023 - normal  - last Stress test 2014 and dec 2023 without ischemia  Plan  - per cardiology  Vaccine counseling  Plan  - Recommend RSV vaccine  Alcohol use High LFT   Plan - Check right upper quadrant ultrasound   Followup  - 8 - 12 weeks after spirometry 30-minute visit with Dr. Sherrin Daisy Return in about 10 weeks (around 12/08/2022) for 30 min visit, after Cleda Daub and DLCO, ILD, Face to Face OR Video Visit, with Dr Marchelle Gearing.    SIGNATURE    Dr. Kalman Shan, M.D., F.C.C.P,  Pulmonary and Critical Care Medicine Staff Physician, [facility] Center Director - Interstitial Lung Disease  Program  Pulmonary Fibrosis [facility] [place], [place], [address]  Pager: [phone], If no answer or between  15:00h - 7:00h: call  [phone] Telephone: [phone]  3:14 PM 09/29/2022   Moderate Complexity MDM OFFICE  2021 E/M guidelines, first released in 2021, with minor revisions added in 2023 and 2024 Must meet the requirements for 2 out of 3 dimensions to qualify.    Number and complexity of problems addressed Amount and/or complexity of data reviewed Risk of complications and/or morbidity  One or  more chronic illness with mild exacerbation, OR progression, OR  side effects of treatment  Two or more stable chronic illnesses  One undiagnosed new problem with uncertain prognosis  One acute illness with systemic symptoms   One Acute complicated injury Must meet the requirements for 1 of 3 of the categories)  Category 1: Tests and documents, historian  Any combination of 3 of the following:  Assessment requiring an independent historian  Review of prior external note(s) from each unique source  Review of results of each unique test  Ordering of each unique test    Category 2: Interpretation of tests   Independent interpretation of a test performed by another physician/other qualified health care professional (not separately reported)  Category 3: Discuss management/tests  Discussion of management or test interpretation with external physician/other qualified health care professional/appropriate source (not separately reported) Moderate risk of morbidity from additional diagnostic testing or treatment Examples only:  Prescription drug management  Decision regarding minor surgery with identfied patient or procedure risk factors  Decision regarding elective major surgery without identified patient or procedure risk factors  Diagnosis or treatment significantly limited by social determinants of health             HIGh Complexity  OFFICE   2021 E/M guidelines, first released in 2021, with minor revisions added in 2023. Must meet the requirements for 2 out of 3 dimensions to  qualify.    Number and complexity of problems addressed Amount and/or complexity of data reviewed Risk of complications and/or morbidity  Severe exacerbation of chronic illness  Acute or chronic illnesses that may pose a threat to life or bodily function, e.g., multiple trauma, acute MI, pulmonary embolus, severe respiratory distress, progressive rheumatoid arthritis, psychiatric illness with potential threat to self or others, peritonitis, acute renal failure, abrupt change in neurological status Must meet the requirements for 2 of 3 of the categories)  Category 1: Tests and documents, historian  Any combination of 3 of the following:  Assessment requiring an independent historian  Review of prior external note(s) from each unique source  Review of results of each unique test  Ordering of each unique test    Category 2: Interpretation of tests    Independent interpretation of a test performed by another physician/other qualified health care professional (not separately reported)  Category 3: Discuss management/tests  Discussion of management or test interpretation with external physician/other qualified health care professional/appropriate source (not separately reported)  HIGH risk of morbidity from additional diagnostic testing or treatment Examples only:  Drug therapy requiring intensive monitoring for toxicity  Decision for elective major surgery with identified pateint or procedure risk factors  Decision regarding hospitalization or escalation of level of care  Decision for DNR or to de-escalate care   Parenteral controlled  substances            LEGEND - Independent interpretation involves the interpretation of a test for which there is a CPT code, and an interpretation or report is customary. When a review and interpretation of a test is performed and documented by the provider, but not separately reported (billed), then this would represent an  independent interpretation. This report does not need to conform to the usual standards of a complete report of the test. This does not include interpretation of tests that do not have formal reports such as a complete blood count with differential and blood cultures. Examples would include reviewing a chest radiograph and documenting in the medical record an interpretation, but  not separately reporting (billing) the interpretation of the chest radiograph.   An appropriate source includes professionals who are not health care professionals but may be involved in the management of the patient, such as a Clinical research associate, upper officer, case manager or teacher, and does not include discussion with family or informal caregivers.    - SDOH: SDOH are the conditions in the environments where people are born, live, learn, work, play, worship, and age that affect a wide range of health, functioning, and quality-of-life outcomes and risks. (e.g., housing, food insecurity, transportation, etc.). SDOH-related Z codes ranging from Z55-Z65 are the ICD-10-CM diagnosis codes used to document SDOH data Z55 - Problems related to education and literacy Z56 - Problems related to employment and unemployment Z57 - Occupational exposure to risk factors Z58 - Problems related to physical environment Z59 - Problems related to housing and economic circumstances [phone] - Problems related to social environment [phone] - Problems related to upbringing [phone] - Other problems related to primary support group, including family circumstances Z52 - Problems related to certain psychosocial circumstances Z65 - Problems related to other psychosocial circumstances

## 2022-09-29 NOTE — Progress Notes (Signed)
Spiro/DLCO performed today. 

## 2022-09-30 NOTE — Progress Notes (Signed)
Office Visit Note  Patient: Sean [name]             Date of Birth: 1950-09-11           MRN: [id number]             PCP: [name], MD Referring: [name], MD Visit Date: 10/14/2022   Subjective:  Follow-up (Patient states he has a letter from his insurance stating they will not cover azathioprine. )   History of Present Illness: Sean [name] is a 72 y.o. 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  He has proceeded with gradually tapering down the prednisone further and had visit with Dr. Marchelle Gearing earlier this month that looked pretty reassuring regarding ILD.  His repeat CK was also downtrending at 278.  He feels his strength has continued to improve with not having difficulties with any particular physical activities or movement anymore.  Leg swelling and hand swelling remains although less severe.  No new trouble with skin rashes, no coughing, no trouble swallowing.  Previous HPI 07/16/2022 Sean [name] is a 72 y.o. 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  Since her last visit he continues doing pretty well with gradual but consistent improvement in his strength and mobility.  He went on a Viking cruise of the [place] and came back with mild upper respiratory viral illness which resolved without incident.  He had an appointment in pulmonology clinic with Dr. Tonia Brooms had chest CT ordered and referred to see Dr. Marchelle Gearing in ILD clinic.  Chest CT obtained on the 28th no report available yet.  He still noticing mild persistent swelling in his legs is not bothering him too much.  Raynaud's symptoms just with cold exposure and not causing any serious pain or long-lasting symptoms.     Previous HPI 06/03/22 Sean KNISELEY is a 72 y.o. 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  Since her last  visit he is continue to stay active and working on his mobility with some additional partial improvement.  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  Leg swelling remains mostly improved.     Previous HPI 05/05/22 DRAEGAN [name] is a 72 y.o. male here for follow up for myositis currently on azathioprine titrating dose now at 100 mg daily and prednisone 60 mg daily.  Since last visit he had EMG testing on March 14 with Dr. 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  He is tolerating the medication okay without major issue.  He is working with physical therapy focusing pretty extensively on his walking and also is doing a lot of shoulder mobility exercises.  He is seen partial improvement in his strength and stiffness with these exercises.  Most recently had a physical therapy session earlier today and has some muscle soreness.  He is also had more frequent nosebleeds reported particularly with bending forward position.  In the past week the frequency decreased he thinks this is from stopping sinus irrigation and applying Vaseline regularly.   Previous HPI 04/13/22 Sean [name] is a 72 y.o. male here for follow up for myositis after initial visit almost 3 weeks  ago he continues on prednisone 60 mg daily.  He started working with physical therapy and has noticed very mild improvements in strength for example is able to brace his foot up 1 step at a time unaided on the right side.  No interval worsening of strength elsewhere no swallowing or respiratory difficulties.  That follow-up with Dr. Kateri Plummer last week he had repeat lab test with CK more elevated again to 4,387 up from 1,164 at his hospital discharge.  BNP was normal at 98. He was taking Lasix at increased dose to daily for edema with some benefit but continues to have swelling in bilateral  hands and legs.  Myositis antibody panel has resulted with positive for 52 kDa SSA antibody.  Muscle biopsy result was pretty nonspecific.   03/23/22 Muscle Biopsy A. MUSCLE, LEFT THIGH, BIOPSY:  - Mildly abnormal muscle showing regenerative fibrosis.  Presence of COX negative fibrous could be age-related, but the presence of regenerating fibers could be in agreement with the diagnosis of myositis; however, there are no inflammatory infiltrates.  Clinical correlation is suggested.   Previous HPI 03/26/22 Sean [name] is a 72 y.o. male here for myositis after recent hospital discharge.  He has experienced progressive weakening in proximal muscles as well as increased peripheral edema ongoing for months leading up to this evaluation.  Increase in his swelling and some dyspnea on exertion prompting cardiology evaluation in October and due to abnormal liver function test he was discontinued off of rosuvastatin at that time.  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  Over the next few months until January he experienced ongoing unintentional weight loss down about 20 pounds and worsening exertion intolerance difficulty with mobility and noticed progressive hunching over of his shoulders with height loss.  He saw gastroenterology due to the unintentional weight loss and abnormal LFTs with CT scan of the abdomen and pelvis with contrast was entirely normal besides prostate hypertrophy.  He eventually had updated echocardiogram and cardiac PET CT scan for evaluation showing normal function no abnormal metabolic uptake in the associated chest CT was negative for abnormal findings.  He saw Dr. Lenise Herald in pulmonology clinic due to his dyspnea despite normal cardiac workup where he was noted to have some myalgias and proximal weakness as well as apparent Raynaud's symptoms with pallor in several digits  prompted concern for connective tissue disease.  Laboratory testing showed highly elevated CK 6777 and aldolase 83.1 and highly positive ANA antibody titer 1:1280.  He was admitted to the hospital and started on high-dose steroids to expedite muscle biopsy with sampling from the left vastus lateralis collected on February 5. He is currently taking prednisone 60 mg daily with plan to taper down by 20 mg/day dose each month and also has plans to follow-up with Dr. Allena Katz for EMG study.  Remains quite weak compared to his baseline that he is able to walk without assistance just only limited to short distances.  He is noticing some dyspnea and nonproductive cough especially at nighttime when lying flat.  Denies any significant dysphagia.  He is not seeing any of the ongoing discoloration occurring in his fingers and toes.  No new skin rashes.   Review of Systems  Constitutional:  Negative for fatigue.  HENT:  Negative for mouth sores and mouth dryness.   Eyes:  Negative for dryness.  Respiratory:  Negative  for shortness of breath.   Cardiovascular:  Negative for chest pain and palpitations.  Gastrointestinal:  Negative for blood in stool, constipation and diarrhea.  Endocrine: Negative for increased urination.  Genitourinary:  Negative for involuntary urination.  Musculoskeletal:  Positive for morning stiffness. Negative for joint pain, gait problem, joint pain, joint swelling, myalgias, muscle weakness, muscle tenderness and myalgias.  Skin:  Negative for color change, rash, hair loss and sensitivity to sunlight.  Allergic/Immunologic: Negative for susceptible to infections.  Neurological:  Negative for dizziness and headaches.  Hematological:  Negative for swollen glands.  Psychiatric/Behavioral:  Negative for depressed mood and sleep disturbance. The patient is not nervous/anxious.     PMFS History:  Patient Active Problem List   Diagnosis Date Noted   Aortic root dilatation (HCC)    ILD  (interstitial lung disease) (HCC) 08/07/2022   High risk medication use 05/05/2022   Swelling 04/13/2022   Long term (current) use of systemic steroids 03/26/2022   Pedal edema 03/26/2022   Myositis 03/18/2022   Alcohol addiction (HCC) 03/18/2022   Hypothyroidism 03/18/2022   Elevated transaminase level 03/18/2022   RBBB 10/24/2015   Coronary artery disease    Tachycardia 05/09/2011   Abnormal liver enzymes 05/06/2011   HTN (hypertension) 04/29/2011   HLD (hyperlipidemia) 04/29/2011    Past Medical History:  Diagnosis Date   Alcohol addiction (HCC)    2010- admitted for withdrawal , detox admission after that   Aortic root dilatation (HCC)    38mm by echo 08/2022   Arrhythmia    Coronary artery disease    95% mid LAD s/p BMS of LAD with aneurysmal segment   Depression    ED (erectile dysfunction)    HLD (hyperlipidemia)    HTN (hypertension)    ILD (interstitial lung disease) (HCC)    Followed by Pulmonary   Mental disorder    Myositis    Peptic ulcer    RBBB 10/24/2015    Family History  Problem Relation Age of Onset   Prostate cancer Father    Heart attack Brother 31   Anesthesia problems Neg Hx    Hypotension Neg Hx    Malignant hyperthermia Neg Hx    Pseudochol deficiency Neg Hx    Colon cancer Neg Hx    Stomach cancer Neg Hx    Esophageal cancer Neg Hx    Colon polyps Neg Hx    Past Surgical History:  Procedure Laterality Date   CORONARY STENT PLACEMENT     MUSCLE BIOPSY Left 03/23/2022   Procedure: LEFT THIGH MUSCLE BIOPSY;  Surgeon: Diamantina Monks, MD;  Location: [facility] OR;  Service: General;  Laterality: Left;   Social History   Social History Narrative   Live with wife in [place], is an Art gallery manager, currently driving trucks for a local firm. Quit smoking 1990 after smoking for 20 years. Drinks heavilyu (1 bottle of vodka or 18-24 beers a day). Used to smoke marijuana but quit in 1990s. Has united healthcare insurance. Goes to [employer] for alcohol  cessation   Immunization History  Administered Date(s) Administered   Fluad Quad(high Dose 65+) 10/20/2021   PFIZER(Purple Top)SARS-COV-2 Vaccination 04/01/2019     Objective: Vital Signs: BP 133/79 (BP Location: Left Arm, Patient Position: Sitting, Cuff Size: Normal)   Pulse 71   Resp 14   Ht 5\' 10"  (1.778 m)   Wt 207 lb (93.9 kg)   BMI 29.70 kg/m    Physical Exam Eyes:     Conjunctiva/sclera: Conjunctivae  normal.  Cardiovascular:     Rate and Rhythm: Normal rate and regular rhythm.  Pulmonary:     Effort: Pulmonary effort is normal.     Breath sounds: Normal breath sounds.  Lymphadenopathy:     Cervical: No cervical adenopathy.  Skin:    General: Skin is warm and dry.     Findings: No rash.     Comments: 1+ pitting edema both legs  Neurological:     Mental Status: He is alert.     Comments: 5/5 strength throughout  Psychiatric:        Mood and Affect: Mood normal.      Musculoskeletal Exam:  Shoulders full ROM no tenderness or swelling Elbows full ROM no tenderness or swelling Wrists full ROM no tenderness or swelling Fingers full ROM no tenderness or swelling Knees full ROM no tenderness or swelling   Investigation: No additional findings.  Imaging: No results found.  Recent Labs: Lab Results  Component Value Date   WBC 8.8 07/16/2022   HGB 13.0 (L) 07/16/2022   PLT 271 07/16/2022   NA 138 07/16/2022   K 4.9 07/16/2022   CL 100 07/16/2022   CO2 30 07/16/2022   GLUCOSE 116 (H) 07/16/2022   BUN 28 (H) 07/16/2022   CREATININE 0.84 07/16/2022   BILITOT 0.4 10/16/2022   ALKPHOS 61 10/16/2022   AST 31 10/16/2022   ALT 24 10/16/2022   PROT 6.2 10/16/2022   ALBUMIN 4.0 10/16/2022   CALCIUM 9.4 07/16/2022   GFRAA >90 05/04/2011    Speciality Comments: No specialty comments available.  Procedures:  No procedures performed Allergies: Patient has no known allergies.   Assessment / Plan:     Visit Diagnoses: Other myositis of multiple  sites  Muscle strength is excellent the last CK was still above goal but only minimally so.  Most recent testing for ILD looked promising has continued follow-up there.  Will recommend proceeding with prednisone taper currently on 10 mg can decrease by 2.5 mg of daily dose every 2 weeks until off.  Will continue azathioprine 150 mg daily.  High risk medication use - Azathioprine 150 mg daily.  Recent lab reviewed LFTs at normal range.  No reported medication intolerance not clear if the continuous steroids are contributing at all with his edema.  No interval infections.  Orders: No orders of the defined types were placed in this encounter.  Meds ordered this encounter  Medications   predniSONE (DELTASONE) 5 MG tablet    Sig: Take 1.5 tablets (7.5 mg total) by mouth daily with breakfast for 30 days, THEN 1 tablet (5 mg total) daily with breakfast for 30 days, THEN 0.5 tablets (2.5 mg total) daily with breakfast.    Dispense:  90 tablet    Refill:  0   azaTHIOprine (IMURAN) 50 MG tablet    Sig: Take 3 tablets (150 mg total) by mouth daily.    Dispense:  90 tablet    Refill:  2     Follow-Up Instructions: Return in about 3 months (around 01/14/2023) for Myositis/ILD AZA/GC taper f/u 3mos.   Fuller Plan, MD  Note - This record has been created using [employer].  Chart creation errors have been sought, but may not always  have been located. Such creation errors do not reflect on  the standard of medical care.

## 2022-10-01 ENCOUNTER — Telehealth: Payer: Self-pay | Admitting: Internal Medicine

## 2022-10-01 NOTE — Telephone Encounter (Signed)
Hi Sean [name]  His CK continues to improve but not normal yet. He is seeing you later in the month. Alerting you in case you want to consider IvIG or cellcept or continue same  Thanks  <MR     Latest Reference Range & Units 03/11/22 11:39 03/20/22 06:09 03/21/22 02:26 03/22/22 02:12 03/23/22 03:46 04/08/22 09:53 05/05/22 15:05 06/03/22 11:41 07/16/22 14:41 09/29/22 15:33  CK Total 7 - 232 U/L 6,777 (H) 5,250 (H) 2,592 (H) 1,605 (H) 1,164 (H) 4,387 (H) 1,900 (H) 1,452 (H) 568 (H) 278 (H)  (H): Data is abnormally high

## 2022-10-02 NOTE — Telephone Encounter (Signed)
Thanks for the update. I will talk with him about the options at our upcoming visit.

## 2022-10-09 ENCOUNTER — Other Ambulatory Visit: Payer: Self-pay | Admitting: Cardiology

## 2022-10-12 ENCOUNTER — Other Ambulatory Visit: Payer: Self-pay | Admitting: Internal Medicine

## 2022-10-12 DIAGNOSIS — M6089 Other myositis, multiple sites: Secondary | ICD-10-CM

## 2022-10-12 NOTE — Telephone Encounter (Signed)
Last Fill: 07/16/2022  Labs: 08/03/2022 Glucose 103 AST 46 RBC 3.90 MCV 102.0 MCH 33.5 RDW 17.4 Neut % 76.1 Lymph % 12.8 Lymph # 0.90  Next Visit: 10/14/2022  Last Visit: 07/16/2022  DX: Other myositis of multiple sites   Current Dose per office note 07/16/2022: not mentioned  Okay to refill Imuran?

## 2022-10-14 ENCOUNTER — Ambulatory Visit: Payer: PPO | Attending: Internal Medicine | Admitting: Internal Medicine

## 2022-10-14 ENCOUNTER — Encounter: Payer: Self-pay | Admitting: Internal Medicine

## 2022-10-14 VITALS — BP 133/79 | HR 71 | Resp 14 | Ht 70.0 in | Wt 207.0 lb

## 2022-10-14 DIAGNOSIS — M6089 Other myositis, multiple sites: Secondary | ICD-10-CM

## 2022-10-14 DIAGNOSIS — Z79899 Other long term (current) drug therapy: Secondary | ICD-10-CM | POA: Diagnosis not present

## 2022-10-14 DIAGNOSIS — Z7952 Long term (current) use of systemic steroids: Secondary | ICD-10-CM | POA: Diagnosis not present

## 2022-10-14 DIAGNOSIS — J849 Interstitial pulmonary disease, unspecified: Secondary | ICD-10-CM

## 2022-10-14 MED ORDER — AZATHIOPRINE 50 MG PO TABS
150.0000 mg | ORAL_TABLET | Freq: Every day | ORAL | 2 refills | Status: DC
Start: 2022-10-14 — End: 2023-01-19

## 2022-10-14 MED ORDER — PREDNISONE 5 MG PO TABS
ORAL_TABLET | ORAL | 0 refills | Status: AC
Start: 2022-10-14 — End: 2023-01-12

## 2022-10-16 ENCOUNTER — Ambulatory Visit: Payer: PPO | Attending: Cardiology

## 2022-10-16 DIAGNOSIS — E785 Hyperlipidemia, unspecified: Secondary | ICD-10-CM

## 2022-10-16 LAB — LIPID PANEL
Chol/HDL Ratio: 3.7 ratio (ref 0.0–5.0)
Cholesterol, Total: 152 mg/dL (ref 100–199)
HDL: 41 mg/dL (ref >39)
LDL Chol Calc (NIH): 90 mg/dL (ref 0–99)
Triglycerides: 118 mg/dL (ref 0–149)
VLDL Cholesterol Cal: 21 mg/dL (ref 5–40)

## 2022-10-16 LAB — HEPATIC FUNCTION PANEL
ALT: 24 IU/L (ref 0–44)
AST: 31 IU/L (ref 0–40)
Albumin: 4 g/dL (ref 3.8–4.8)
Alkaline Phosphatase: 61 IU/L (ref 44–121)
Bilirubin Total: 0.4 mg/dL (ref 0.0–1.2)
Bilirubin, Direct: 0.16 mg/dL (ref 0.00–0.40)
Total Protein: 6.2 g/dL (ref 6.0–8.5)

## 2022-10-21 ENCOUNTER — Other Ambulatory Visit (HOSPITAL_COMMUNITY): Payer: Self-pay

## 2022-10-22 NOTE — Telephone Encounter (Signed)
Submitted a Prior Authorization request to [facility] for Imuran via CoverMyMeds. Will update once we receive a response.  Prior Authorization from [facility] for Inuran. Authorization has been DENIED because There is a previously denied request on file, please contact [facility] for further assistance.  Phone# [phone]  Attempted to contacted the patient to advise previous PA was denied and the new PA was put in too soon. 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 Attempted to contact the patient to verify that he is okay with that price still. Left patient and message to call the office back.

## 2022-11-09 DIAGNOSIS — H353111 Nonexudative age-related macular degeneration, right eye, early dry stage: Secondary | ICD-10-CM | POA: Diagnosis not present

## 2022-11-09 DIAGNOSIS — H35453 Secondary pigmentary degeneration, bilateral: Secondary | ICD-10-CM | POA: Diagnosis not present

## 2022-11-09 DIAGNOSIS — H25813 Combined forms of age-related cataract, bilateral: Secondary | ICD-10-CM | POA: Diagnosis not present

## 2022-11-09 DIAGNOSIS — H53143 Visual discomfort, bilateral: Secondary | ICD-10-CM | POA: Diagnosis not present

## 2022-11-09 DIAGNOSIS — H353122 Nonexudative age-related macular degeneration, left eye, intermediate dry stage: Secondary | ICD-10-CM | POA: Diagnosis not present

## 2022-11-09 DIAGNOSIS — H35363 Drusen (degenerative) of macula, bilateral: Secondary | ICD-10-CM | POA: Diagnosis not present

## 2022-11-13 DIAGNOSIS — Z23 Encounter for immunization: Secondary | ICD-10-CM | POA: Diagnosis not present

## 2022-12-07 DIAGNOSIS — E785 Hyperlipidemia, unspecified: Secondary | ICD-10-CM | POA: Diagnosis not present

## 2022-12-07 DIAGNOSIS — I10 Essential (primary) hypertension: Secondary | ICD-10-CM | POA: Diagnosis not present

## 2022-12-07 DIAGNOSIS — M609 Myositis, unspecified: Secondary | ICD-10-CM | POA: Diagnosis not present

## 2022-12-07 DIAGNOSIS — R7309 Other abnormal glucose: Secondary | ICD-10-CM | POA: Diagnosis not present

## 2023-01-04 NOTE — Progress Notes (Unsigned)
IOV 03/11/22 - Patient of Dr Tonia Brooms   This is a 72 year old gentleman, history of alcohol abuse, coronary artery disease, depression, hypertension, hyperlipidemia.  Patient is a former smoker quit in 19 90, 34-pack-year history. He trys to walk a lot. He has dogs that he walks regularly. Over the past 4 months he has had increased sob and fatigue. Distances and inclines make walking worse. He saw cardiology and they did several studies and everything seemed fine. He was sent to GI for evaluation for elevated LFTs, still drinking alcohol daily.  He feels like he is becoming weaker and weaker over the past couple of months.  He is finding that he is having pains in his muscles and when he describes this he points to locations in large muscle groups and side of the thigh as well as shoulders.  He is also noticed ongoing weight loss.  He also has recurrent episodes of Raynaud's of the hands.  OV 03/18/2022: Here today for evaluation of recent abnormal labs.  His CK is elevated, aldolase elevated, ANA titer was positive.  For some reason his myositis antibody panel was canceled.  04/08/2022 Patient was seen last month for shortness of breath/dyspnea symptoms. He was dx with inflammatory myositis recently. Currently on prednisone 60mg  and following with rheumatology.   No issues with shortness of breath. Congestion is a lot better since being discharged from the hospital. His main complaint is muscle fatigue. He first noticed weakness last spring. He has no muscle pain except for chronic lower back pain. He has seen some improvement in his functioning since starting steroids. He is able to do a little bit more than he could last week. He is getting physical therapy. After therapy he was tired for the rest of the day.   He also has generalized fluid retention that started before he began taking high dose steroids, he increased lasix 40mg  for a couple of days. He has cut back on alcohol but still having 2-3  drinks every couple of days. Following with Dr. Allena Katz with neurology , planning for EMG and NCS.   OV 06/15/2022: Doing well today.  He feels so much better.  He is stronger.  Tolerating his Imuran plus prednisone.  He had pulmonary function test completed which reviewed today in the office which shows a reduced FVC.  His DLCO however is normal.  He does have some crackles on exam has not had any axial CT imaging.  Still feels dyspneic when he exerts himself.  OV 07/28/2022 -transfer of care from Dr. Elige Radon Icard to Dr. Marchelle Gearing at the ILD center  Subjective:  Patient ID: Sean [name], Sean [name] , DOB: [birth year] , age 72 y.o. , MRN: [id number] , ADDRESS: [address] [place] [address] PCP [name], MD Patient Care Team: [name], MD as PCP - General (Family Medicine) Quintella Reichert, MD as PCP - Cardiology (Cardiology)  This Provider for this visit: Treatment Team:  Attending Provider: Kalman Shan, MD    07/28/2022 -   Chief Complaint  Patient presents with   Follow-up    F/up on Myositis, ILD     HPI Sean [name] 72 y.o. -I am meeting him for the first time.  History is gained from talking to him. y.  History is also gained from review of the external records that include cardiology in December 2023 and rheumatology in February 2024 and April 2024.  He tells me that a year ago he started  noticing some difficulty standing up but in October 2023 while walking the dog he became extremely fatigued.  He was admitted to the hospital after that for 6 days.  He received IV steroids.  He says after that he established with Dr. Audie Box.  He has been on prednisone since early 2024.  He established with Dr. Sheliah Hatch and rheumatology.  Has been given a diagnosis of myositis.  He was also started on azathioprine.  Currently is on prednisone taper.  The plan is to get him to half tablet per day [?  Milligram] within the next 2 months.  He is tolerating these  medications fine.  Has become a little cushingoid.  He states he is much better but still weak.  His current symptom scores are below.  He does have a mild cough but it is is clearing of the throat.  This is chronic.  He is noticed to be on ACE inhibitor's.  He did return from a [place] cruise 2 weeks ago and got sick but he is now recovered.  The cough is gone away from that.     Myositis : Saw Dr Orest Dikes rhem 06/03/22  and 2/8/244 -external record reviwd:  Coronary arter calcification:CAD/ Has hx of remote bare metal strength.  SAw NP 01/23/23 [place] for cardiollogy: This extrenal record reviewed.  Toprol started. Echo ordered was normal . Advised to continue plavix.  Last stress test 2014 -> . Had PET CT 12/19/3: results reviewed. No evidenc of ischemia  Alcoholosm: He says he was dependent on alcohol in the past but currently is not but he still drinks 3-4 beers a day.  He has never had a right upper quadrant ultrasound.  He says he is not currently addicted but he does drink daily.  He does not think he have a withdrawal if he quits.  CT Chest data - HRCT 07/14/22 - indepenently visualized, revewied and independently conclude with findings below -> although I personally find the disease burden to be less than 10%.  I did find a prior CT abdomen lung image.  It appears that this ILD is new compared to last year.  I personally contacted Dr. Allegra Lai via email and she replied back.  She is going to review the image again.  Narrative & Impression  CLINICAL DATA:  72 year old Sean [name] with history of myositis. Evaluate for interstitial lung disease.   EXAM: CT CHEST WITHOUT CONTRAST   TECHNIQUE: Multidetector CT imaging of the chest was performed following the standard protocol without intravenous contrast. High resolution imaging of the lungs, as well as inspiratory and expiratory imaging, was performed.   RADIATION DOSE REDUCTION: This exam was performed according to the departmental  dose-optimization program which includes automated exposure control, adjustment of the mA and/or kV according to patient size and/or use of iterative reconstruction technique.   COMPARISON:  No priors.   FINDINGS: Cardiovascular: Heart size is normal. There is no significant pericardial fluid, thickening or pericardial calcification. There is aortic atherosclerosis, as well as atherosclerosis of the great vessels of the mediastinum and the coronary arteries, including calcified atherosclerotic plaque in the left main, left anterior descending, left circumflex and right coronary arteries. Calcifications of the aortic valve.   Mediastinum/Nodes: No pathologically enlarged mediastinal or hilar lymph nodes. Please note that accurate exclusion of hilar adenopathy is limited on noncontrast CT scans. Esophagus is unremarkable in appearance. No axillary lymphadenopathy.   Lungs/Pleura: High-resolution images demonstrate some mild ground-glass attenuation and septal thickening, most  evident in the lung bases bilaterally. Minimal subpleural reticulation. No traction bronchiectasis or honeycombing. Inspiratory and expiratory imaging demonstrates mild air trapping indicative of mild small airways disease. No acute consolidative airspace disease. No pleural effusions.   Upper Abdomen: Aortic atherosclerosis.   Musculoskeletal: There are no aggressive appearing lytic or blastic lesions noted in the visualized portions of the skeleton.   IMPRESSION: 1. The appearance of the lungs suggests early changes of interstitial lung disease, with a spectrum of findings at this time categorized as indeterminate for usual interstitial pneumonia (UIP) per current ATS guidelines. Repeat high-resolution chest CT is suggested in 12 months to assess for temporal changes in the appearance of the lung parenchyma. 2. Aortic atherosclerosis, in addition to left main and three-vessel coronary artery disease.  Assessment for potential risk factor modification, dietary therapy or pharmacologic therapy may be warranted, if clinically indicated. 3. There are calcifications of the aortic valve. Echocardiographic correlation for evaluation of potential valvular dysfunction may be warranted if clinically indicated.   Aortic Atherosclerosis (ICD10-I70.0).     Electronically Signed   By: Trudie Reed M.D.   On: 07/18/2022 07:42        OV 09/29/2022  Subjective:  Patient ID: Sean [name], Sean [name] , DOB: 10/14/1950 , age 14 y.o. , MRN: [id number] , ADDRESS: [address] [place] [address] PCP [name], MD Patient Care Team: [name], MD as PCP - General (Family Medicine) Quintella Reichert, MD as PCP - Cardiology (Cardiology)  This Provider for this visit: Treatment Team:  Attending Provider: Kalman Shan, MD    09/29/2022 -   Chief Complaint  Patient presents with   Follow-up    F/up on PFT     HPI Sean [name] 72 y.o. -myositis with ILD follow-up.  After his last visit I got an email from Dr. Dorothey Baseman, ccording to Dr. Allegra Lai thoracic radiologist: In an email; , "this would be consistent with early NSIP,  but I do not see definite traction bronchiectasis and could also be mild infect/inflam pneumonitis. Findings are new from Feb 11, 2022.  Involvement is less than 10%."  He tells me now that he continues to do stable.  In fact dyspnea symptom score slightly better.  He says [place].  He is able to climb 3 levels holding the guardrail but without stopping.  He spent a week there.  He felt his effort tolerance was way better than it was in January 2024.  Therefore he feels he is improved.  He is weaning down on prednisone.  He has upcoming appointment Dr. Sheliah Hatch and he believes he will be weaned off prednisone.  He continues Imuran at 150 mg daily.  He is tolerating this well.  Review  of the labs indicate that his CK in May 2024 was 500s.  His liver enzymes continue to be high even in June 2024.  I thought I ordered a right upper quadrant ultrasound but I do not see this being done.  Will reorder this today.  He is known to have alcohol intake in the past.  He did pulmonary function test today and shows a slight decline compared to spring 2024.  During this time his been on steroids and Imuran.  I do not know if this is a true decrease or a variation.  This because his symptoms are stable.  He has a true decline and he needs change of  the Imuran to CellCept or another immunomodulator and definitely will need to add antifibrotic.  However his abnormal liver function test will preclude adding an antifibrotic.  Will get this rechecked today.  Repeat sit stand hypxoemia test is stable  CT Chest data from date: nMay 2024   According to Dr. Allegra Lai thoracic radiologist: In an email; , "this would be consistent with early NSIP,  but I do not see definite traction bronchiectasis and could also be mild infect/inflam pneumonitis. Findings are new from Feb 11, 2022.  Involvement is less than 10%."   OV 01/05/2023  Subjective:  Patient ID: Sean [name], Sean [name] , DOB: 1950/04/16 , age 59 y.o. , MRN: [id number] , ADDRESS: [address] [place] [address] PCP [name], MD Patient Care Team: [name], MD as PCP - General (Family Medicine) Quintella Reichert, MD as PCP - Cardiology (Cardiology)  This Provider for this visit: Treatment Team:  Attending Provider: Kalman Shan, MD    01/05/2023 -   Chief Complaint  Patient presents with   Follow-up    Pft f/u, pt states he has a increase productive cough. ( Clear ) no inhaler usage    Follow-up very mild interstitial lung disease less than 10% burden in the setting of myositis and elevated CK  Myositis immunosuppressed with Imuran and prednisone follows with Dr. Sheliah Hatch  Alcohol use previous  history; normal CT scan of the abdomen in December 2023  Previous history of greater than 30 pack smoking  HPI Sean [name] 72 y.o. -returns for follow-up.  Overall he is stable as seen by the symptom score although his cough is something that he feels it is different or new.  He states there is no associated wheezing but for the last few months he has had some cough particularly when he lies on his lateral side but is not limited supine position.  Its intermittent it is on and off but in the last 1 week it suddenly improving.  He says it significant enough that his wife goes to another bed.  He believes it is because of the wet fall season.  In the past his blood eosinophils have been high but no allergy test has been done.  But he has no wheezing or other symptoms.  He had pulmonary function test and it is stable.  At this point in time we resolved that we will just follow along.  He did have a CT scan of the chest earlier this year and there is no evidence of lung cancer.  His next CT scan of the chest should be both for ILD and for lung cancer screening would be in June 2025.  Of note he has myositis and is on Imuran and prednisone.  His CK is down to 200s in Sean 2024 he did see Dr. Sheliah Hatch back then.  He is going to see Dr. Sheliah Hatch in the next few weeks.  We took a shared decision for [place] to check his CK and LFT ahead of seeing Dr. Dimple Casey.    SYMPTOM SCALE - ILD 07/28/2022 09/29/2022  01/05/2023 Supportive care  Current weight     O2 use ra ra ra  Shortness of Breath 0 -> 5 scale with 5 being worst (score 6 If unable to do)  0  At rest 0 0 0  Simple tasks - showers, clothes change, eating, shaving 0 0 0  Household (dishes, doing bed, laundry) 0 0 00  Shopping 1  0 0  Walking level at own pace 1 1 1   Walking up Stairs 2 2 2   Total (30-36) Dyspnea Score 4 3 3   How bad is your cough? 1 -ACE inhibitor. 1 3  How bad is your fatigue 2 2 1   How bad is nausea 0 0 0  How bad is  vomiting?  0 0 0  How bad is diarrhea? 2 1 1   How bad is anxiety? 1 1 0  How bad is depression 1 0 0  Any chronic pain - if so where and how bad x x     Simple office walk 224 (66+46 x 2) feet Pod A at [facility] x  3 laps goal with forehead probe 07/28/2022  09/29/2022 t 09/29/2022    O2 used ra Ra - finger Ra forehead probel   Number laps completed 15 sit stand 15 sit stand 15   Comments about pace good good    Resting Pulse Ox/HR 98% and 69/min 98% and HR 70 100%a ahd HR 74   Final Pulse Ox/HR 96% and 83/min 96% (dropped to 80% x 1 but trace was bad) 99% and HR 86   Desaturated </= 88% no no    Desaturated <= 3% points no no    Got Tachycardic >/= 90/min no no    Symptoms at end of test none Level 2 dyspnea    Miscellaneous comments none      PFT     Latest Ref Rng & Units 09/29/2022   10:25 AM 06/04/2022   11:25 AM  ILD indicators  FVC-Pre L 2.83  3.00   FVC-Predicted Pre % 64  68   FVC-Post L  2.95   FVC-Predicted Post %  67   TLC L  5.25   TLC Predicted %  74   DLCO uncorrected ml/min/mmHg 18.62  21.96   DLCO UNC %Pred % 72  85   DLCO Corrected ml/min/mmHg 18.62  22.28   DLCO COR %Pred % 72  86       LAB RESULTS last 96 hours No results found.  LAB RESULTS last 90 days Recent Results (from the past 2160 hour(s))  Lipid panel     Status: None   Collection Time: 10/16/22  9:09 AM  Result Value Ref Range   Cholesterol, Total 152 100 - 199 mg/dL   Triglycerides 621 0 - 149 mg/dL   HDL 41 >30 mg/dL   VLDL Cholesterol Cal 21 5 - 40 mg/dL   LDL Chol Calc (NIH) 90 0 - 99 mg/dL   Chol/HDL Ratio 3.7 0.0 - 5.0 ratio    Comment:                                   T. Chol/HDL Ratio                                             Men  Women                               1/2 Avg.Risk  3.4    3.3  Avg.Risk  5.0    4.4                                2X Avg.Risk  9.6    7.1                                3X Avg.Risk 23.4   11.0   Hepatic  function panel     Status: None   Collection Time: 10/16/22  9:09 AM  Result Value Ref Range   Total Protein 6.2 6.0 - 8.5 g/dL   Albumin 4.0 3.8 - 4.8 g/dL   Bilirubin Total 0.4 0.0 - 1.2 mg/dL   Bilirubin, Direct 1.61 0.00 - 0.40 mg/dL   Alkaline Phosphatase 61 44 - 121 IU/L   AST 31 0 - 40 IU/L   ALT 24 0 - 44 IU/L         has a past medical history of Alcohol addiction (HCC), Aortic root dilatation (HCC), Arrhythmia, Coronary artery disease, Depression, ED (erectile dysfunction), HLD (hyperlipidemia), HTN (hypertension), ILD (interstitial lung disease) (HCC), Mental disorder, Myositis, Peptic ulcer, and RBBB (10/24/2015).   reports that he quit smoking about 34 years ago. His smoking use included cigarettes. He started smoking about 64 years ago. He has a 30 pack-year smoking history. He has been exposed to tobacco smoke. He has never used smokeless tobacco.  Past Surgical History:  Procedure Laterality Date   CORONARY STENT PLACEMENT     MUSCLE BIOPSY Left 03/23/2022   Procedure: LEFT THIGH MUSCLE BIOPSY;  Surgeon: Diamantina Monks, MD;  Location: [facility] OR;  Service: General;  Laterality: Left;    No Known Allergies  Immunization History  Administered Date(s) Administered   Fluad Quad(high Dose 65+) 10/20/2021   PFIZER(Purple Top)SARS-COV-2 Vaccination 04/01/2019    Family History  Problem Relation Age of Onset   Prostate cancer Father    Heart attack Brother 35   Anesthesia problems Neg Hx    Hypotension Neg Hx    Malignant hyperthermia Neg Hx    Pseudochol deficiency Neg Hx    Colon cancer Neg Hx    Stomach cancer Neg Hx    Esophageal cancer Neg Hx    Colon polyps Neg Hx      Current Outpatient Medications:    azaTHIOprine (IMURAN) 50 MG tablet, Take 3 tablets (150 mg total) by mouth daily., Disp: 90 tablet, Rfl: 2   clopidogrel (PLAVIX) 75 MG tablet, TAKE ONE TABLET BY MOUTH ONCE A DAY, Disp: 90 tablet, Rfl: 3   furosemide (LASIX) 20 MG tablet, Take 1 tablet  (20 mg total) by mouth as needed for fluid or edema. (Patient taking differently: Take 20 mg by mouth daily as needed for fluid or edema.), Disp: 90 tablet, Rfl: 3   levothyroxine (SYNTHROID) 25 MCG tablet, Take 25 mcg by mouth daily in the afternoon., Disp: , Rfl:    MELATONIN PO, Take 1 tablet by mouth at bedtime as needed (sleep)., Disp: , Rfl:    metoprolol succinate (TOPROL-XL) 50 MG 24 hr tablet, Take 1 tablet (50 mg total) by mouth every evening. Take with or immediately following a meal. (Patient taking differently: Take 50 mg by mouth every evening.), Disp: 90 tablet, Rfl: 3   Multiple Vitamin (MULITIVITAMIN WITH MINERALS) TABS, Take 1 tablet by mouth daily., Disp: , Rfl:    nitroGLYCERIN (NITROSTAT) 0.4 MG  SL tablet, Place 1 tablet (0.4 mg total) under the tongue every 5 (five) minutes as needed for chest pain., Disp: 25 tablet, Rfl: 3   predniSONE (DELTASONE) 5 MG tablet, Take 1.5 tablets (7.5 mg total) by mouth daily with breakfast for 30 days, THEN 1 tablet (5 mg total) daily with breakfast for 30 days, THEN 0.5 tablets (2.5 mg total) daily with breakfast., Disp: 90 tablet, Rfl: 0   ezetimibe (ZETIA) 10 MG tablet, Take 1 tablet (10 mg total) by mouth daily., Disp: 90 tablet, Rfl: 3   pantoprazole (PROTONIX) 40 MG tablet, Take 1 tablet (40 mg total) by mouth daily., Disp: 30 tablet, Rfl: 0      Objective:   Vitals:   01/05/23 1527  BP: (!) 168/81  Pulse: 64  SpO2: 98%  Weight: 206 lb (93.4 kg)  Height: 5\' 10"  (1.778 m)    Estimated body mass index is 29.56 kg/m as calculated from the following:   Height as of this encounter: 5\' 10"  (1.778 m).   Weight as of this encounter: 206 lb (93.4 kg).  @WEIGHTCHANGE @  Filed Weights   01/05/23 1527  Weight: 206 lb (93.4 kg)     Physical Exam   General: No distress. Looks well O2 at rest: no Cane present: no Sitting in wheel chair: no Frail: no Obese: no Neuro: Alert and Oriented x 3. GCS 15. Speech normal Psych:  Pleasant Resp:  Barrel Chest - no.  Wheeze - no, Crackles - no, No overt respiratory distress CVS: Normal heart sounds. Murmurs - no Ext: Stigmata of Connective Tissue Disease - no HEENT: Normal upper airway. PEERL +. No post nasal drip        Assessment:       ICD-10-CM   1. Interstitial lung disease due to connective tissue disease (HCC)  J84.89 CK (Creatine Kinase)   M35.9 Hepatic function panel    2. Raynaud's phenomenon without gangrene  I73.00 CK (Creatine Kinase)    Hepatic function panel    3. Myositis of lower extremity, unspecified laterality, unspecified myositis type  M60.9     4. High risk medication use  Z79.899 CK (Creatine Kinase)    Hepatic function panel    5. Cough, unspecified type  R05.9          Plan:     Patient Instructions     ICD-10-CM   1. Interstitial lung disease due to connective tissue disease (HCC)  J84.89    M35.9     2. Raynaud's phenomenon without gangrene  I73.00     3. Myositis of lower extremity, unspecified laterality, unspecified myositis type  M60.9     4. High risk medication use  Z79.899     5. Cough, unspecified type  R05.9         Interstitial lung disease due to connective tissue disease (HCC) DOE (dyspnea on exertion) Myositis of lower extremity, unspecified laterality, unspecified myositis type Raynaud's phenomenon without gangrene High Risk Medication use    - ILD is clinically stable based on symptoms, , and PFT - Overall burden in CT chest June 2024 burden of ILD was mild and < 10% - CK improved to 200s in Sean 2024   Plan -  Hold off anti-fibrotic for now - I will recommend antifibrotic's if the pulmonary function test at follow-up is not improving or is getting worse or if the severity of lung disease burden is greater than 10-20% provided liver is normal  --Recheck  CK and liver  function test today 01/05/2023  - HRCT in 7 months; (1 year followuop)   History of smoking 30 or more pack  years.Quit > 30 years ago  - no evidence of lung cancer June 2024  Plan  - capture lung cancer screening on CT chest for ILD through age 12 per ACS guidelnes; June 2025  Cough - unclear cause  - glad is better  Plan  - If recurs can do allergy testing  Coronary artery calcification seen on CAT scan Aortic valve calcification  - last echo dec 2023 - normal  - last Stress test 2014 and dec 2023 without ischemia  Plan  - per cardiology    Alcohol use High LFT history  - norma CT abd dec 2023 - normal LFT Aug 2024  Plan -per PCP [name], MD    Followup  - June 2024; 15 min vsiit but after CT chest   FOLLOWUP Return in about 7 months (around 08/05/2023) for 15 min visit, ILD, Face to Face Visit.    SIGNATURE    Dr. Kalman Shan, M.D., F.C.C.P,  Pulmonary and Critical Care Medicine Staff Physician, [facility] Director - Interstitial Lung Disease  Program  Pulmonary Fibrosis [facility] at [facility] [place], [place], [address]  Pager: [phone], If no answer or between  15:00h - 7:00h: call [phone] Telephone: [phone]  3:55 PM 01/05/2023

## 2023-01-04 NOTE — Patient Instructions (Signed)
ICD-10-CM   1. Interstitial lung disease due to connective tissue disease (HCC)  J84.89    M35.9     2. Raynaud's phenomenon without gangrene  I73.00     3. Myositis of lower extremity, unspecified laterality, unspecified myositis type  M60.9     4. High risk medication use  Z79.899     5. Cough, unspecified type  R05.9         Interstitial lung disease due to connective tissue disease (HCC) DOE (dyspnea on exertion) Myositis of lower extremity, unspecified laterality, unspecified myositis type Raynaud's phenomenon without gangrene High Risk Medication use    - ILD is clinically stable based on symptoms, , and PFT - Overall burden in CT chest June 2024 burden of ILD was mild and < 10% - CK improved to 200s in August 2024   Plan -  Hold off anti-fibrotic for now - I will recommend antifibrotic's if the pulmonary function test at follow-up is not improving or is getting worse or if the severity of lung disease burden is greater than 10-20% provided liver is normal  --Recheck  CK and liver function test today 01/05/2023  - HRCT in 7 months; (1 year followuop)   History of smoking 30 or more pack years.Quit > 30 years ago  - no evidence of lung cancer June 2024  Plan  - capture lung cancer screening on CT chest for ILD through age 27 per ACS guidelnes; June 2025  Cough - unclear cause  - glad is better  Plan  - If recurs can do allergy testing  Coronary artery calcification seen on CAT scan Aortic valve calcification  - last echo dec 2023 - normal  - last Stress test 2014 and dec 2023 without ischemia  Plan  - per cardiology    Alcohol use High LFT history  - norma CT abd dec 2023 - normal LFT Aug 2024  Plan -per PCP [name], MD    Followup  - June 2024; 15 min vsiit but after CT chest

## 2023-01-04 NOTE — Progress Notes (Unsigned)
Office Visit Note  Patient: Sean [name]             Date of Birth: [birth year]           MRN: [id number]             PCP: [name], MD Referring: [name], MD Visit Date: 01/18/2023   Subjective:  No chief complaint on file.   History of Present Illness: Sean [name] is a 72 y.o. 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    Previous HPI 10/14/2022 [name] is a 72 y.o. 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  He has proceeded with gradually tapering down the prednisone further and had visit with Dr. Marchelle Gearing earlier this month that looked pretty reassuring regarding ILD.  His repeat CK was also downtrending at 278.  He feels his strength has continued to improve with not having difficulties with any particular physical activities or movement anymore.  Leg swelling and hand swelling remains although less severe.  No new trouble with skin rashes, no coughing, no trouble swallowing.   Previous HPI 07/16/2022 Sean [name] is a 72 y.o. 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  Since her last visit he continues doing pretty well with gradual but consistent improvement in his strength and mobility.  He went on a Viking cruise of the [place] and came back with mild upper respiratory viral illness which resolved without incident.  He had an appointment in pulmonology clinic with Dr. Tonia Brooms had chest CT ordered and referred to see Dr. Marchelle Gearing in ILD clinic.  Chest CT obtained on the 28th no report available yet.  He still noticing mild persistent swelling in his legs is not bothering him too much.  Raynaud's symptoms just with cold exposure and not causing any serious pain or long-lasting symptoms.     Previous HPI 06/03/22 Sean [name] is a 72 y.o. male here for follow up for autoimmune myositis  currently on azathioprine 150 mg daily and prednisone 20 mg twice daily.  Since her last visit he is continue to stay active and working on his mobility with some additional partial improvement.  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  Leg swelling remains mostly improved.     Previous HPI 05/05/22 Sean [name] is a 72 y.o. male here for follow up for myositis currently on azathioprine titrating dose now at 100 mg daily and prednisone 60 mg daily.  Since last visit he had EMG testing on March 14 with Dr. 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  He is tolerating the medication okay without major issue.  He is working with physical therapy focusing pretty extensively on his walking and also is doing a lot of shoulder mobility exercises.  He is seen partial improvement in his strength and stiffness with these exercises.  Most recently had a physical therapy session earlier today and has some muscle soreness.  He is also had more frequent nosebleeds reported particularly with bending forward position.  In the past week the frequency decreased he thinks this is from stopping sinus irrigation and applying Vaseline regularly.   Previous HPI 04/13/22  Sean [name] is a 72 y.o. male here for follow up for myositis after initial visit almost 3 weeks ago he continues on prednisone 60 mg daily.  He started working with physical therapy and has noticed very mild improvements in strength for example is able to brace his foot up 1 step at a time unaided on the right side.  No interval worsening of strength elsewhere no swallowing or respiratory difficulties.  That follow-up with Dr. Kateri Plummer last week he had repeat lab test with CK more elevated again to 4,387 up from 1,164 at his hospital discharge.  BNP was normal at 98. He was taking Lasix at increased  dose to daily for edema with some benefit but continues to have swelling in bilateral hands and legs.  Myositis antibody panel has resulted with positive for 52 kDa SSA antibody.  Muscle biopsy result was pretty nonspecific.   03/23/22 Muscle Biopsy A. MUSCLE, LEFT THIGH, BIOPSY:  - Mildly abnormal muscle showing regenerative fibrosis.  Presence of COX negative fibrous could be age-related, but the presence of regenerating fibers could be in agreement with the diagnosis of myositis; however, there are no inflammatory infiltrates.  Clinical correlation is suggested.   Previous HPI 03/26/22 Sean [name] is a 72 y.o. male here for myositis after recent hospital discharge.  He has experienced progressive weakening in proximal muscles as well as increased peripheral edema ongoing for months leading up to this evaluation.  Increase in his swelling and some dyspnea on exertion prompting cardiology evaluation in October and due to abnormal liver function test he was discontinued off of rosuvastatin at that time.  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  Over the next few months until January he experienced ongoing unintentional weight loss down about 20 pounds and worsening exertion intolerance difficulty with mobility and noticed progressive hunching over of his shoulders with height loss.  He saw gastroenterology due to the unintentional weight loss and abnormal LFTs with CT scan of the abdomen and pelvis with contrast was entirely normal besides prostate hypertrophy.  He eventually had updated echocardiogram and cardiac PET CT scan for evaluation showing normal function no abnormal metabolic uptake in the associated chest CT was negative for abnormal findings.  He saw Dr. Lenise Herald in pulmonology clinic due to his dyspnea despite normal cardiac workup where he was noted to have some myalgias and  proximal weakness as well as apparent Raynaud's symptoms with pallor in several digits prompted concern for connective tissue disease.  Laboratory testing showed highly elevated CK 6777 and aldolase 83.1 and highly positive ANA antibody titer 1:1280.  He was admitted to the hospital and started on high-dose steroids to expedite muscle biopsy with sampling from the left vastus lateralis collected on February 5. He is currently taking prednisone 60 mg daily with plan to taper down by 20 mg/day dose each month and also has plans to follow-up with Dr. Allena Katz for EMG study.  Remains quite weak compared to his baseline that he is able to walk without assistance just only limited to short distances.  He is noticing some dyspnea and nonproductive cough especially at nighttime when lying flat.  Denies any significant dysphagia.  He is not seeing any of the ongoing discoloration occurring in his fingers and toes.  No new skin rashes.   No Rheumatology ROS completed.   PMFS History:  Patient  Active Problem List   Diagnosis Date Noted   Aortic root dilatation (HCC)    ILD (interstitial lung disease) (HCC) 08/07/2022   High risk medication use 05/05/2022   Swelling 04/13/2022   Long term (current) use of systemic steroids 03/26/2022   Pedal edema 03/26/2022   Myositis 03/18/2022   Alcohol addiction (HCC) 03/18/2022   Hypothyroidism 03/18/2022   Elevated transaminase level 03/18/2022   RBBB 10/24/2015   Coronary artery disease    Tachycardia 05/09/2011   Abnormal liver enzymes 05/06/2011   HTN (hypertension) 04/29/2011   HLD (hyperlipidemia) 04/29/2011    Past Medical History:  Diagnosis Date   Alcohol addiction (HCC)    2010- admitted for withdrawal , detox admission after that   Aortic root dilatation (HCC)    38mm by echo 08/2022   Arrhythmia    Coronary artery disease    95% mid LAD s/p BMS of LAD with aneurysmal segment   Depression    ED (erectile dysfunction)    HLD (hyperlipidemia)     HTN (hypertension)    ILD (interstitial lung disease) (HCC)    Followed by Pulmonary   Mental disorder    Myositis    Peptic ulcer    RBBB 10/24/2015    Family History  Problem Relation Age of Onset   Prostate cancer Father    Heart attack Brother 39   Anesthesia problems Neg Hx    Hypotension Neg Hx    Malignant hyperthermia Neg Hx    Pseudochol deficiency Neg Hx    Colon cancer Neg Hx    Stomach cancer Neg Hx    Esophageal cancer Neg Hx    Colon polyps Neg Hx    Past Surgical History:  Procedure Laterality Date   CORONARY STENT PLACEMENT     MUSCLE BIOPSY Left 03/23/2022   Procedure: LEFT THIGH MUSCLE BIOPSY;  Surgeon: Diamantina Monks, MD;  Location: [facility] OR;  Service: General;  Laterality: Left;   Social History   Social History Narrative   Live with wife in [place], is an Art gallery manager, currently driving trucks for a local firm. Quit smoking 1990 after smoking for 20 years. Drinks heavilyu (1 bottle of vodka or 18-24 beers a day). Used to smoke marijuana but quit in 1990s. Has united healthcare insurance. Goes to [employer] for alcohol cessation   Immunization History  Administered Date(s) Administered   Fluad Quad(high Dose 65+) 10/20/2021   PFIZER(Purple Top)SARS-COV-2 Vaccination 04/01/2019     Objective: Vital Signs: There were no vitals taken for this visit.   Physical Exam   Musculoskeletal Exam: ***  CDAI Exam: CDAI Score: -- Patient Global: --; Provider Global: -- Swollen: --; Tender: -- Joint Exam 01/18/2023   No joint exam has been documented for this visit   There is currently no information documented on the homunculus. Go to the Rheumatology activity and complete the homunculus joint exam.  Investigation: No additional findings.  Imaging: No results found.  Recent Labs: Lab Results  Component Value Date   WBC 8.8 07/16/2022   HGB 13.0 (L) 07/16/2022   PLT 271 07/16/2022   NA 138 07/16/2022   K 4.9 07/16/2022   CL 100 07/16/2022   CO2  30 07/16/2022   GLUCOSE 116 (H) 07/16/2022   BUN 28 (H) 07/16/2022   CREATININE 0.84 07/16/2022   BILITOT 0.4 10/16/2022   ALKPHOS 61 10/16/2022   AST 31 10/16/2022   ALT 24 10/16/2022   PROT 6.2 10/16/2022   ALBUMIN 4.0 10/16/2022  CALCIUM 9.4 07/16/2022   GFRAA >90 05/04/2011    Speciality Comments: No specialty comments available.  Procedures:  No procedures performed Allergies: Patient has no known allergies.   Assessment / Plan:     Visit Diagnoses: No diagnosis found.  ***  Orders: No orders of the defined types were placed in this encounter.  No orders of the defined types were placed in this encounter.    Follow-Up Instructions: No follow-ups on file.   Metta Clines, RT  Note - This record has been created using [employer].  Chart creation errors have been sought, but may not always  have been located. Such creation errors do not reflect on  the standard of medical care.

## 2023-01-05 ENCOUNTER — Encounter: Payer: Self-pay | Admitting: Internal Medicine

## 2023-01-05 ENCOUNTER — Ambulatory Visit: Payer: PPO | Admitting: Internal Medicine

## 2023-01-05 VITALS — BP 149/80 | HR 64 | Ht 70.0 in | Wt 206.0 lb

## 2023-01-05 DIAGNOSIS — M609 Myositis, unspecified: Secondary | ICD-10-CM

## 2023-01-05 DIAGNOSIS — R059 Cough, unspecified: Secondary | ICD-10-CM

## 2023-01-05 DIAGNOSIS — Z79899 Other long term (current) drug therapy: Secondary | ICD-10-CM

## 2023-01-05 DIAGNOSIS — I7300 Raynaud's syndrome without gangrene: Secondary | ICD-10-CM

## 2023-01-05 DIAGNOSIS — J8489 Other specified interstitial pulmonary diseases: Secondary | ICD-10-CM

## 2023-01-05 DIAGNOSIS — M359 Systemic involvement of connective tissue, unspecified: Secondary | ICD-10-CM | POA: Diagnosis not present

## 2023-01-05 NOTE — Progress Notes (Signed)
Spirometry/DLCO performed today. 

## 2023-01-05 NOTE — Patient Instructions (Signed)
Spirometry/DLCO performed today. 

## 2023-01-06 LAB — HEPATIC FUNCTION PANEL
ALT: 21 U/L (ref 0–53)
AST: 29 U/L (ref 0–37)
Albumin: 4.2 g/dL (ref 3.5–5.2)
Alkaline Phosphatase: 63 U/L (ref 39–117)
Bilirubin, Direct: 0.1 mg/dL (ref 0.0–0.3)
Total Bilirubin: 0.4 mg/dL (ref 0.2–1.2)
Total Protein: 6.9 g/dL (ref 6.0–8.3)

## 2023-01-06 LAB — CK: Total CK: 214 U/L (ref 7–232)

## 2023-01-11 ENCOUNTER — Telehealth: Payer: Self-pay | Admitting: *Deleted

## 2023-01-11 DIAGNOSIS — K219 Gastro-esophageal reflux disease without esophagitis: Secondary | ICD-10-CM | POA: Diagnosis not present

## 2023-01-11 DIAGNOSIS — Z1211 Encounter for screening for malignant neoplasm of colon: Secondary | ICD-10-CM | POA: Diagnosis not present

## 2023-01-11 DIAGNOSIS — Z7901 Long term (current) use of anticoagulants: Secondary | ICD-10-CM | POA: Diagnosis not present

## 2023-01-11 NOTE — Telephone Encounter (Signed)
   Pre-operative Risk Assessment    Patient Name: Sean [name]  DOB: 12-Oct-1950 MRN: [id number]  DATE OF LAST VISIT: 08/07/22 DR. TURNER DATE OF NEXT VISIT: NONE    Request for Surgical Clearance    Procedure:   COLONOSCOPY  Date of Surgery:  Clearance TBD                                 Surgeon:  DR. Liliane Shi Surgeon's Group or Practice Name:  [facility] Phone number:  [phone] Fax number:  [phone]   Type of Clearance Requested:   - Medical  - Pharmacy:  Hold Clopidogrel (Plavix) x 5 DAYS PRIOR   Type of Anesthesia:   PROPOFOL   Additional requests/questions:    Elpidio Anis   01/11/2023, 10:47 AM

## 2023-01-12 ENCOUNTER — Telehealth: Payer: Self-pay

## 2023-01-12 NOTE — Telephone Encounter (Signed)
   Name: Sean [name]  DOB: 12-11-1950  MRN: [id number]  Primary Cardiologist: Armanda Magic, MD  Chart reviewed as part of pre-operative protocol coverage. Because of Sean [name] past medical history and time since last visit, he will require a follow-up telephone visit in order to better assess preoperative cardiovascular risk.  Pre-op covering staff: - Please schedule appointment and call patient to inform them. If patient already had an upcoming appointment within acceptable timeframe, please add "pre-op clearance" to the appointment notes so provider is aware. - Please contact requesting surgeon's office via preferred method (i.e, phone, fax) to inform them of need for appointment prior to surgery.  He is taking Plavix for history of bare-metal stents in the LAD.  Would highly recommend substituting aspirin if Plavix hold is absolutely necessary.  Then, resuming Plavix alone after procedure as soon as medically safe to do so.  Sharlene Dory, PA-C  01/12/2023, 2:19 PM

## 2023-01-12 NOTE — Telephone Encounter (Signed)
Preop clearance tele visit now scheduled, med rec and consent done

## 2023-01-12 NOTE — Telephone Encounter (Signed)
  Patient Consent for Virtual Visit        Sean [name] has provided verbal consent on 01/12/2023 for a virtual visit (video or telephone).   CONSENT FOR VIRTUAL VISIT FOR:  Sean [name]  By participating in this virtual visit I agree to the following:  I hereby voluntarily request, consent and authorize [facility] and its employed or contracted physicians, physician assistants, nurse practitioners or other licensed health care professionals (the Practitioner), to provide me with telemedicine health care services (the "Services") as deemed necessary by the treating Practitioner. I acknowledge and consent to receive the Services by the Practitioner via telemedicine. I understand that the telemedicine visit will involve communicating with the Practitioner through live audiovisual communication technology and the disclosure of certain medical information by electronic transmission. I acknowledge that I have been given the opportunity to request an in-person assessment or other available alternative prior to the telemedicine visit and am voluntarily participating in the telemedicine visit.  I understand that I have the right to withhold or withdraw my consent to the use of telemedicine in the course of my care at any time, without affecting my right to future care or treatment, and that the Practitioner or I may terminate the telemedicine visit at any time. I understand that I have the right to inspect all information obtained and/or recorded in the course of the telemedicine visit and may receive copies of available information for a reasonable fee.  I understand that some of the potential risks of receiving the Services via telemedicine include:  Delay or interruption in medical evaluation due to technological equipment failure or disruption; Information transmitted may not be sufficient (e.g. poor resolution of images) to allow for appropriate medical decision making by the Practitioner;  and/or  In rare instances, security protocols could fail, causing a breach of personal health information.  Furthermore, I acknowledge that it is my responsibility to provide information about my medical history, conditions and care that is complete and accurate to the best of my ability. I acknowledge that Practitioner's advice, recommendations, and/or decision may be based on factors not within their control, such as incomplete or inaccurate data provided by me or distortions of diagnostic images or specimens that may result from electronic transmissions. I understand that the practice of medicine is not an exact science and that Practitioner makes no warranties or guarantees regarding treatment outcomes. I acknowledge that a copy of this consent can be made available to me via my patient portal [facility] MyChart), or I can request a printed copy by calling the office of [facility].    I understand that my insurance will be billed for this visit.   I have read or had this consent read to me. I understand the contents of this consent, which adequately explains the benefits and risks of the Services being provided via telemedicine.  I have been provided ample opportunity to ask questions regarding this consent and the Services and have had my questions answered to my satisfaction. I give my informed consent for the services to be provided through the use of telemedicine in my medical care

## 2023-01-18 ENCOUNTER — Other Ambulatory Visit: Payer: Self-pay | Admitting: Internal Medicine

## 2023-01-18 ENCOUNTER — Ambulatory Visit: Payer: PPO | Admitting: Internal Medicine

## 2023-01-18 DIAGNOSIS — M6089 Other myositis, multiple sites: Secondary | ICD-10-CM

## 2023-01-18 DIAGNOSIS — Z79899 Other long term (current) drug therapy: Secondary | ICD-10-CM

## 2023-01-18 DIAGNOSIS — Z7952 Long term (current) use of systemic steroids: Secondary | ICD-10-CM

## 2023-01-18 NOTE — Telephone Encounter (Signed)
Last Fill: 10/14/2022  Labs: 01/05/2023 Hepatic Function Panel WNL  Next Visit: 02/01/2023  Last Visit: 10/14/2022  DX: Other myositis of multiple sites   Current Dose per office note 10/14/2022: Azathioprine 150 mg daily.   Patient to update labs at upcoming appointment on 02/01/2023.   Okay to refill Imuran?

## 2023-01-20 NOTE — Progress Notes (Signed)
Office Visit Note  Patient: Sean [name]             Date of Birth: September 13, 1950           MRN: [id number]             PCP: [name], MD Referring: [name], MD Visit Date: 02/01/2023   Subjective:  Follow-up (Patient states he has been off of prednisone for about ten days now. )   Discussed the use of AI scribe software for clinical note transcription with the patient, who gave verbal consent to proceed.  History of Present Illness   FEDRICK [name] is a 72 y.o. 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  His CK level was recently normalized in November with last follow-up with Dr. Marchelle Gearing. They have been off prednisone for approximately ten days and have not noticed any problems since discontinuing the medication. They are currently on azathioprine, which they take three times a day.  The patient also reports experiencing Raynaud's syndrome, which they have had for a long time. They note that the symptoms have been worse recently, with numb fingers persisting even in warm conditions. They believe their blood pressure medication, a beta-blocker, is exacerbating these symptoms.  The patient also mentions some edema, which they monitor using a ring as a gauge. They had to take Lasix for two days recently due to swelling. Despite these issues, the patient reports that they are able to do everything they want to do.    Previous HPI 10/14/2022 Sean [name] is a 72 y.o. 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  He has proceeded with gradually tapering down the prednisone further and had visit with Dr. Marchelle Gearing earlier this month that looked pretty reassuring regarding ILD.  His repeat CK was also downtrending at 278.  He feels his strength has continued to improve with not having difficulties with any particular physical activities or movement anymore.  Leg swelling and hand swelling  remains although less severe.  No new trouble with skin rashes, no coughing, no trouble swallowing.   Previous HPI 07/16/2022 Sean [name] is a 72 y.o. 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  Since her last visit he continues doing pretty well with gradual but consistent improvement in his strength and mobility.  He went on a Viking cruise of the [place] and came back with mild upper respiratory viral illness which resolved without incident.  He had an appointment in pulmonology clinic with Dr. Tonia Brooms had chest CT ordered and referred to see Dr. Marchelle Gearing in ILD clinic.  Chest CT obtained on the 28th no report available yet.  He still noticing mild persistent swelling in his legs is not bothering him too much.  Raynaud's symptoms just with cold exposure and not causing any serious pain or long-lasting symptoms.     Previous HPI 06/03/22 Sean [name] is a 72 y.o. 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  Since her last visit he is continue to stay active and working on his mobility with some additional partial improvement.  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  Leg swelling remains mostly improved.  Previous HPI 05/05/22 Sean [name] is a 71 y.o. male here for follow up for myositis currently on azathioprine titrating dose now at 100 mg daily and prednisone 60 mg daily.  Since last visit he had EMG testing on March 14 with Dr. 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  He is tolerating the medication okay without major issue.  He is working with physical therapy focusing pretty extensively on his walking and also is doing a lot of shoulder mobility exercises.  He is seen partial improvement in  his strength and stiffness with these exercises.  Most recently had a physical therapy session earlier today and has some muscle soreness.  He is also had more frequent nosebleeds reported particularly with bending forward position.  In the past week the frequency decreased he thinks this is from stopping sinus irrigation and applying Vaseline regularly.   Previous HPI 04/13/22 Sean [name] is a 72 y.o. male here for follow up for myositis after initial visit almost 3 weeks ago he continues on prednisone 60 mg daily.  He started working with physical therapy and has noticed very mild improvements in strength for example is able to brace his foot up 1 step at a time unaided on the right side.  No interval worsening of strength elsewhere no swallowing or respiratory difficulties.  That follow-up with Dr. Kateri Plummer last week he had repeat lab test with CK more elevated again to 4,387 up from 1,164 at his hospital discharge.  BNP was normal at 98. He was taking Lasix at increased dose to daily for edema with some benefit but continues to have swelling in bilateral hands and legs.  Myositis antibody panel has resulted with positive for 52 kDa SSA antibody.  Muscle biopsy result was pretty nonspecific.   03/23/22 Muscle Biopsy A. MUSCLE, LEFT THIGH, BIOPSY:  - Mildly abnormal muscle showing regenerative fibrosis.  Presence of COX negative fibrous could be age-related, but the presence of regenerating fibers could be in agreement with the diagnosis of myositis; however, there are no inflammatory infiltrates.  Clinical correlation is suggested.   Previous HPI 03/26/22 Sean [name] is a 72 y.o. male here for myositis after recent hospital discharge.  He has experienced progressive weakening in proximal muscles as well as increased peripheral edema ongoing for months leading up to this evaluation.  Increase in his swelling and some dyspnea on exertion prompting cardiology evaluation in October and due to abnormal liver  function test he was discontinued off of rosuvastatin at that time.  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  Over the next few months until January he experienced ongoing unintentional weight loss down about 20 pounds and worsening exertion intolerance difficulty with mobility and noticed progressive hunching over of his shoulders with height loss.  He saw gastroenterology due to the unintentional weight loss and abnormal LFTs with CT scan of the abdomen and pelvis with contrast was entirely normal besides prostate hypertrophy.  He eventually had updated echocardiogram and cardiac PET CT scan for evaluation showing normal function no abnormal metabolic uptake in the associated chest CT was negative for abnormal findings.  He saw Dr. Lenise Herald in pulmonology clinic due to his dyspnea despite normal cardiac workup where he was noted to have some myalgias and proximal weakness as well as apparent  Raynaud's symptoms with pallor in several digits prompted concern for connective tissue disease.  Laboratory testing showed highly elevated CK 6777 and aldolase 83.1 and highly positive ANA antibody titer 1:1280.  He was admitted to the hospital and started on high-dose steroids to expedite muscle biopsy with sampling from the left vastus lateralis collected on February 5. He is currently taking prednisone 60 mg daily with plan to taper down by 20 mg/day dose each month and also has plans to follow-up with Dr. Allena Katz for EMG study.  Remains quite weak compared to his baseline that he is able to walk without assistance just only limited to short distances.  He is noticing some dyspnea and nonproductive cough especially at nighttime when lying flat.  Denies any significant dysphagia.  He is not seeing any of the ongoing discoloration occurring in his fingers and toes.  No new skin rashes.   Review of  Systems  Constitutional:  Negative for fatigue.  HENT:  Negative for mouth sores and mouth dryness.   Eyes:  Negative for dryness.  Respiratory:  Negative for shortness of breath.   Cardiovascular:  Negative for chest pain and palpitations.  Gastrointestinal:  Negative for blood in stool, constipation and diarrhea.  Endocrine: Negative for increased urination.  Genitourinary:  Negative for involuntary urination.  Musculoskeletal:  Negative for joint pain, gait problem, joint pain, joint swelling, myalgias, muscle weakness, morning stiffness, muscle tenderness and myalgias.  Skin:  Positive for color change. Negative for rash, hair loss and sensitivity to sunlight.  Allergic/Immunologic: Negative for susceptible to infections.  Neurological:  Negative for dizziness and headaches.  Hematological:  Negative for swollen glands.  Psychiatric/Behavioral:  Negative for depressed mood and sleep disturbance. The patient is not nervous/anxious.     PMFS History:  Patient Active Problem List   Diagnosis Date Noted   Aortic root dilatation (HCC)    ILD (interstitial lung disease) (HCC) 08/07/2022   High risk medication use 05/05/2022   Swelling 04/13/2022   Long term (current) use of systemic steroids 03/26/2022   Pedal edema 03/26/2022   Myositis 03/18/2022   Alcohol addiction (HCC) 03/18/2022   Hypothyroidism 03/18/2022   Elevated transaminase level 03/18/2022   RBBB 10/24/2015   Coronary artery disease    Tachycardia 05/09/2011   Abnormal liver enzymes 05/06/2011   HTN (hypertension) 04/29/2011   HLD (hyperlipidemia) 04/29/2011    Past Medical History:  Diagnosis Date   Alcohol addiction (HCC)    2010- admitted for withdrawal , detox admission after that   Aortic root dilatation (HCC)    38mm by echo 08/2022   Arrhythmia    Coronary artery disease    95% mid LAD s/p BMS of LAD with aneurysmal segment   Depression    ED (erectile dysfunction)    HLD (hyperlipidemia)    HTN  (hypertension)    ILD (interstitial lung disease) (HCC)    Followed by Pulmonary   Mental disorder    Myositis    Peptic ulcer    RBBB 10/24/2015    Family History  Problem Relation Age of Onset   Prostate cancer Father    Heart attack Brother 34   Anesthesia problems Neg Hx    Hypotension Neg Hx    Malignant hyperthermia Neg Hx    Pseudochol deficiency Neg Hx    Colon cancer Neg Hx    Stomach cancer Neg Hx    Esophageal cancer Neg Hx    Colon polyps Neg Hx    Past Surgical  History:  Procedure Laterality Date   CORONARY STENT PLACEMENT     MUSCLE BIOPSY Left 03/23/2022   Procedure: LEFT THIGH MUSCLE BIOPSY;  Surgeon: Diamantina Monks, MD;  Location: [facility] OR;  Service: General;  Laterality: Left;   Social History   Social History Narrative   Live with wife in [place], is an Art gallery manager, currently driving trucks for a local firm. Quit smoking 1990 after smoking for 20 years. Drinks heavilyu (1 bottle of vodka or 18-24 beers a day). Used to smoke marijuana but quit in 1990s. Has united healthcare insurance. Goes to [employer] for alcohol cessation   Immunization History  Administered Date(s) Administered   Fluad Quad(high Dose 65+) 10/20/2021   PFIZER(Purple Top)SARS-COV-2 Vaccination 04/01/2019     Objective: Vital Signs: BP 112/67 (BP Location: Left Arm, Patient Position: Sitting, Cuff Size: Normal)   Pulse 76   Resp 14   Ht 5\' 10"  (1.778 m)   Wt 204 lb (92.5 kg)   BMI 29.27 kg/m    Physical Exam Eyes:     Conjunctiva/sclera: Conjunctivae normal.  Cardiovascular:     Rate and Rhythm: Normal rate and regular rhythm.  Pulmonary:     Effort: Pulmonary effort is normal.     Breath sounds: Normal breath sounds.  Musculoskeletal:     Comments: Right worse than left 1+ pitting edema below the knee, wearing compressive socks  Lymphadenopathy:     Cervical: No cervical adenopathy.  Skin:    General: Skin is warm and dry.     Comments: Mild swelling in both hands and  cool to touch, no discoloration  Neurological:     Mental Status: He is alert.  Psychiatric:        Mood and Affect: Mood normal.      Musculoskeletal Exam:  Shoulders full ROM no tenderness or swelling Elbows full ROM no tenderness or swelling Wrists full ROM no tenderness or swelling Fingers full ROM no tenderness or swelling Knees full ROM no tenderness or swelling Ankles full ROM no tenderness or swelling   Investigation: No additional findings.  Imaging: No results found.  Recent Labs: Lab Results  Component Value Date   WBC 8.8 07/16/2022   HGB 13.0 (L) 07/16/2022   PLT 271 07/16/2022   NA 138 07/16/2022   K 4.9 07/16/2022   CL 100 07/16/2022   CO2 30 07/16/2022   GLUCOSE 116 (H) 07/16/2022   BUN 28 (H) 07/16/2022   CREATININE 0.84 07/16/2022   BILITOT 0.4 01/05/2023   ALKPHOS 63 01/05/2023   AST 29 01/05/2023   ALT 21 01/05/2023   PROT 6.9 01/05/2023   ALBUMIN 4.2 01/05/2023   CALCIUM 9.4 07/16/2022   GFRAA >90 05/04/2011    Speciality Comments: No specialty comments available.  Procedures:  No procedures performed Allergies: Patient has no known allergies.   Assessment / Plan:     Visit Diagnoses: Other myositis of multiple sites  CK normalized after treatment with Prednisone and Azathioprine.  Lung appears mildly involved clinically without progression sounds like plan is follow-up in 6 months with Dr. Marchelle Gearing.  Prednisone has been discontinued for 10 days with no reported symptoms. Discussed the possibility of tapering Azathioprine in the future if remission is maintained. -Continue Azathioprine 150 mg daily. -Recheck CK in 3 weeks to assess for any changes after discontinuation of Prednisone along with monitoring labs for AZA.  High risk medication use -Will check CBC and CMP at upcoming labs scheduled in few weeks.  Edema On  Lasix 20 mg as needed taking on some days and not others.  Currently mild persistent swelling despite doses on last 2  days.  Could be a risk factor to increase if started on vasodilator for Raynaud's.  Raynaud's Syndrome Exacerbation of symptoms, possibly related to blood pressure medication.  He was asking about tadalafil or other treatment that 1 may be contraindicated at present as he has prescription for as needed nitroglycerin. -Check with Dr. Kateri Plummer about HTN meds if any switch is appropriate   Orders: Orders Placed This Encounter  Procedures   CK   CBC with Differential/Platelet   COMPLETE METABOLIC PANEL WITH GFR   No orders of the defined types were placed in this encounter.    Follow-Up Instructions: Return in about 3 months (around 05/02/2023) for Myositis on AZA f/u 3mos.   Fuller Plan, MD  Note - This record has been created using [employer].  Chart creation errors have been sought, but may not always  have been located. Such creation errors do not reflect on  the standard of medical care.

## 2023-01-21 LAB — PULMONARY FUNCTION TEST
DL/VA % pred: 114 %
DL/VA: 4.61 ml/min/mmHg/L
DLCO cor % pred: 82 %
DLCO cor: 21.21 ml/min/mmHg
DLCO unc % pred: 82 %
DLCO unc: 21.21 ml/min/mmHg
FEF 25-75 Pre: 2.25 L/s
FEF2575-%Pred-Pre: 94 %
FEV1-%Pred-Pre: 73 %
FEV1-Pre: 2.33 L
FEV1FVC-%Pred-Pre: 108 %
FEV6-%Pred-Pre: 71 %
FEV6-Pre: 2.93 L
FEV6FVC-%Pred-Pre: 106 %
FVC-%Pred-Pre: 67 %
FVC-Pre: 2.93 L
Pre FEV1/FVC ratio: 80 %
Pre FEV6/FVC Ratio: 100 %

## 2023-01-26 ENCOUNTER — Ambulatory Visit: Payer: PPO | Attending: Cardiovascular Disease | Admitting: Student

## 2023-01-26 DIAGNOSIS — Z01810 Encounter for preprocedural cardiovascular examination: Secondary | ICD-10-CM

## 2023-01-26 NOTE — Progress Notes (Signed)
Virtual Visit via Telephone Note   Because of Sean [name]'s co-morbid illnesses, he is at least at moderate risk for complications without adequate follow up.  This format is felt to be most appropriate for this patient at this time.  The patient did not have access to video technology/had technical difficulties with video requiring transitioning to audio format only (telephone).  All issues noted in this document were discussed and addressed.  No physical exam could be performed with this format.  Please refer to the patient's chart for his consent to telehealth for [facility].  Evaluation Performed:  Preoperative cardiovascular risk assessment _____________   Date:  01/26/2023   Patient ID:  Sean [name], DOB 07-Aug-1950, MRN [id number] Patient Location:  Home Provider location:   Office  Primary Care Provider:  [name], [name] Primary Cardiologist:  Sean Magic, [name]  Chief Complaint / Patient Profile   72 y.o. y/o male with a h/o CAD s/p BMS to mid LAD April 2007, RBBB, aortic root dilatation, hypertension, hyperlipidemia, interstitial lung disease who is pending colonoscopy by Dr. Lorenso [name] and presents today for telephonic preoperative cardiovascular risk assessment.  History of Present Illness    Sean [name] is a 72 y.o. male who presents via audio/video conferencing for a telehealth visit today.  Pt was last seen in cardiology clinic on 08/07/2022 by Dr. Mayford [name].  At that time Sean [name] was stable from a cardiac standpoint.  The patient is now pending procedure as outlined above. Since his last visit, he is doing well. Patient denies shortness of breath, dyspnea on exertion, orthopnea or PND. Patient [name] stable chronic lower extremity edema he manages with occasional Lasix. No chest pain, pressure, or tightness. No palpitations. He is active walking his dogs daily and performing moderate construction work including Therapist, music.     Past Medical History    Past  Medical History:  Diagnosis Date   Alcohol addiction (HCC)    2010- admitted for withdrawal , detox admission after that   Aortic root dilatation (HCC)    38mm by echo 08/2022   Arrhythmia    Coronary artery disease    95% mid LAD s/p BMS of LAD with aneurysmal segment   Depression    ED (erectile dysfunction)    HLD (hyperlipidemia)    HTN (hypertension)    ILD (interstitial lung disease) (HCC)    Followed by Pulmonary   Mental disorder    Myositis    Peptic ulcer    RBBB 10/24/2015   Past Surgical History:  Procedure Laterality Date   CORONARY STENT PLACEMENT     MUSCLE BIOPSY Left 03/23/2022   Procedure: LEFT THIGH MUSCLE BIOPSY;  Surgeon: Sean Monks, [name];  Location: [facility] OR;  Service: General;  Laterality: Left;    Allergies  No Known Allergies  Home Medications    Prior to Admission medications   Medication Sig Start Date End Date Taking? Authorizing Provider  azaTHIOprine (IMURAN) 50 MG tablet TAKE 3 TABLETS BY MOUTH ONCE A DAY 01/19/23   Sean [name]  clopidogrel (PLAVIX) 75 MG tablet TAKE ONE TABLET BY MOUTH ONCE A DAY 10/09/22   Sean Reichert, [name]  ezetimibe (ZETIA) 10 MG tablet Take 1 tablet (10 mg total) by mouth daily. 09/03/22 12/02/22  Sean Reichert, [name]  furosemide (LASIX) 20 MG tablet Take 1 tablet (20 mg total) by mouth as needed for fluid or edema. Patient taking differently: Take 20 mg by mouth  daily as needed for fluid or edema. 02/12/22 02/13/23  Sean [name]  levothyroxine (SYNTHROID) 25 MCG tablet Take 25 mcg by mouth daily in the afternoon.    Provider, Historical, [name]  MELATONIN PO Take 1 tablet by mouth at bedtime as needed (sleep).    Provider, Historical, [name]  metoprolol succinate (TOPROL-XL) 50 MG 24 hr tablet Take 1 tablet (50 mg total) by mouth every evening. Take with or immediately following a meal. Patient taking differently: Take 50 mg by mouth every evening. 01/22/22 01/23/23  Sean [name]  Multiple Vitamin  (MULITIVITAMIN WITH MINERALS) TABS Take 1 tablet by mouth daily.    Provider, Historical, [name]  nitroGLYCERIN (NITROSTAT) 0.4 MG SL tablet Place 1 tablet (0.4 mg total) under the tongue every 5 (five) minutes as needed for chest pain. 01/12/22   Sean [name]  pantoprazole (PROTONIX) 40 MG tablet Take 1 tablet (40 mg total) by mouth daily. 03/23/22 04/22/22  Briant Cedar, [name]    Physical Exam    Vital Signs:  Sean [name] does not have vital signs available for review today.  Given telephonic nature of communication, physical exam is limited. AAOx3. NAD. Normal affect.  Speech and respirations are unlabored.  Accessory Clinical Findings    None  Assessment & Plan    Primary Cardiologist: Sean Magic, [name]  Preoperative cardiovascular risk assessment.  Colonoscopy by Dr. Lorenso [name].  Chart reviewed as part of pre-operative protocol coverage. According to the RCRI, patient [name] a 0.9% risk of MACE. Patient reports activity equivalent to >4.0 METS (walking with dogs daily, performs moderate construction work).   Given past medical history and time since last visit, based on ACC/AHA guidelines, AKIN STEEVES would be at acceptable risk for the planned procedure without further cardiovascular testing.   Patient was advised that if he develops new symptoms prior to surgery to contact our office to arrange a follow-up appointment.  he verbalized understanding.  Per office protocol, he may hold Plavix for 5 days prior to procedure and should resume as soon as hemodynamically stable postoperatively.  Patient is to take aspirin 81 mg daily throughout perioperative period.  He may discontinue aspirin when he resumes Plavix.  I will route this recommendation to the requesting party via Epic fax function.  Please call with questions.  Time:   Today, I have spent 5 minutes with the patient with telehealth technology discussing medical history, symptoms, and management plan.     Sean Levering, [name]  01/26/2023, 7:15 AM

## 2023-02-01 ENCOUNTER — Encounter: Payer: Self-pay | Admitting: Internal Medicine

## 2023-02-01 ENCOUNTER — Ambulatory Visit: Payer: PPO | Attending: Internal Medicine | Admitting: Internal Medicine

## 2023-02-01 VITALS — BP 112/67 | HR 76 | Resp 14 | Ht 70.0 in | Wt 204.0 lb

## 2023-02-01 DIAGNOSIS — M6089 Other myositis, multiple sites: Secondary | ICD-10-CM

## 2023-02-01 DIAGNOSIS — Z79899 Other long term (current) drug therapy: Secondary | ICD-10-CM

## 2023-02-08 DIAGNOSIS — H35363 Drusen (degenerative) of macula, bilateral: Secondary | ICD-10-CM | POA: Diagnosis not present

## 2023-02-08 DIAGNOSIS — H43812 Vitreous degeneration, left eye: Secondary | ICD-10-CM | POA: Diagnosis not present

## 2023-02-08 DIAGNOSIS — H353130 Nonexudative age-related macular degeneration, bilateral, stage unspecified: Secondary | ICD-10-CM | POA: Diagnosis not present

## 2023-02-12 ENCOUNTER — Other Ambulatory Visit: Payer: Self-pay | Admitting: Internal Medicine

## 2023-02-12 ENCOUNTER — Other Ambulatory Visit: Payer: Self-pay | Admitting: Nurse Practitioner

## 2023-02-12 DIAGNOSIS — K644 Residual hemorrhoidal skin tags: Secondary | ICD-10-CM | POA: Diagnosis not present

## 2023-02-12 DIAGNOSIS — K640 First degree hemorrhoids: Secondary | ICD-10-CM | POA: Diagnosis not present

## 2023-02-12 DIAGNOSIS — M6089 Other myositis, multiple sites: Secondary | ICD-10-CM

## 2023-02-12 DIAGNOSIS — Z1211 Encounter for screening for malignant neoplasm of colon: Secondary | ICD-10-CM | POA: Diagnosis not present

## 2023-02-12 DIAGNOSIS — K5730 Diverticulosis of large intestine without perforation or abscess without bleeding: Secondary | ICD-10-CM | POA: Diagnosis not present

## 2023-02-12 DIAGNOSIS — K6389 Other specified diseases of intestine: Secondary | ICD-10-CM | POA: Diagnosis not present

## 2023-02-12 NOTE — Telephone Encounter (Signed)
Please schedule patient a follow up visit. Patient due 05/02/2023. Thanks!

## 2023-02-12 NOTE — Telephone Encounter (Signed)
Last Fill: 01/19/2023  Labs: 08/03/2022 CBC and CMP Glucose 103 AST 46 RBC 3.90 MCV 102.0 MCH 33.5 RDW 17.4 Neut % 76.1 Lymph % 12.8 Lymph # 0.90  01/05/2023 Hepatic Function Panel normal  Next Visit: Due 05/02/2023. Message sent to the front to schedule.   Last Visit: 02/01/2023  DX: Other myositis of multiple sites   Current Dose per office note 02/01/2023: Azathioprine 150 mg daily   Contacted the patient and advised labs are due. Patient states he plans to come in on 02/23/2023 to have his lab work done.   Okay to refill Imuran?

## 2023-02-15 NOTE — Telephone Encounter (Signed)
LMOM for patient to call and schedule 3 month follow-up appointment with Dr. Dimple Casey.

## 2023-02-23 ENCOUNTER — Other Ambulatory Visit: Payer: Self-pay | Admitting: *Deleted

## 2023-02-23 DIAGNOSIS — M6089 Other myositis, multiple sites: Secondary | ICD-10-CM

## 2023-02-23 DIAGNOSIS — Z79899 Other long term (current) drug therapy: Secondary | ICD-10-CM

## 2023-02-24 LAB — CBC WITH DIFFERENTIAL/PLATELET
Absolute Lymphocytes: 996 {cells}/uL (ref 850–3900)
Absolute Monocytes: 912 {cells}/uL (ref 200–950)
Basophils Absolute: 42 {cells}/uL (ref 0–200)
Basophils Relative: 0.7 %
Eosinophils Absolute: 138 {cells}/uL (ref 15–500)
Eosinophils Relative: 2.3 %
HCT: 45.4 % (ref 38.5–50.0)
Hemoglobin: 15 g/dL (ref 13.2–17.1)
MCH: 32.3 pg (ref 27.0–33.0)
MCHC: 33 g/dL (ref 32.0–36.0)
MCV: 97.8 fL (ref 80.0–100.0)
MPV: 10.7 fL (ref 7.5–12.5)
Monocytes Relative: 15.2 %
Neutro Abs: 3912 {cells}/uL (ref 1500–7800)
Neutrophils Relative %: 65.2 %
Platelets: 272 10*3/uL (ref 140–400)
RBC: 4.64 10*6/uL (ref 4.20–5.80)
RDW: 13.2 % (ref 11.0–15.0)
Total Lymphocyte: 16.6 %
WBC: 6 10*3/uL (ref 3.8–10.8)

## 2023-02-24 LAB — COMPLETE METABOLIC PANEL WITH GFR
AG Ratio: 1.7 (calc) (ref 1.0–2.5)
ALT: 19 U/L (ref 9–46)
AST: 24 U/L (ref 10–35)
Albumin: 4.3 g/dL (ref 3.6–5.1)
Alkaline phosphatase (APISO): 75 U/L (ref 35–144)
BUN/Creatinine Ratio: 26 (calc) — ABNORMAL HIGH (ref 6–22)
BUN: 27 mg/dL — ABNORMAL HIGH (ref 7–25)
CO2: 31 mmol/L (ref 20–32)
Calcium: 9.7 mg/dL (ref 8.6–10.3)
Chloride: 98 mmol/L (ref 98–110)
Creat: 1.02 mg/dL (ref 0.70–1.28)
Globulin: 2.5 g/dL (ref 1.9–3.7)
Glucose, Bld: 104 mg/dL — ABNORMAL HIGH (ref 65–99)
Potassium: 4.7 mmol/L (ref 3.5–5.3)
Sodium: 137 mmol/L (ref 135–146)
Total Bilirubin: 0.4 mg/dL (ref 0.2–1.2)
Total Protein: 6.8 g/dL (ref 6.1–8.1)
eGFR: 78 mL/min/{1.73_m2} (ref >=60)

## 2023-02-24 LAB — CK: Total CK: 138 U/L (ref 44–196)

## 2023-04-30 DIAGNOSIS — Z125 Encounter for screening for malignant neoplasm of prostate: Secondary | ICD-10-CM | POA: Diagnosis not present

## 2023-04-30 DIAGNOSIS — E785 Hyperlipidemia, unspecified: Secondary | ICD-10-CM | POA: Diagnosis not present

## 2023-04-30 DIAGNOSIS — R7309 Other abnormal glucose: Secondary | ICD-10-CM | POA: Diagnosis not present

## 2023-04-30 DIAGNOSIS — E039 Hypothyroidism, unspecified: Secondary | ICD-10-CM | POA: Diagnosis not present

## 2023-04-30 DIAGNOSIS — M609 Myositis, unspecified: Secondary | ICD-10-CM | POA: Diagnosis not present

## 2023-04-30 DIAGNOSIS — I10 Essential (primary) hypertension: Secondary | ICD-10-CM | POA: Diagnosis not present

## 2023-04-30 DIAGNOSIS — I2510 Atherosclerotic heart disease of native coronary artery without angina pectoris: Secondary | ICD-10-CM | POA: Diagnosis not present

## 2023-04-30 DIAGNOSIS — Z23 Encounter for immunization: Secondary | ICD-10-CM | POA: Diagnosis not present

## 2023-04-30 DIAGNOSIS — Z0000 Encounter for general adult medical examination without abnormal findings: Secondary | ICD-10-CM | POA: Diagnosis not present

## 2023-05-11 ENCOUNTER — Other Ambulatory Visit: Payer: Self-pay | Admitting: Internal Medicine

## 2023-05-11 ENCOUNTER — Other Ambulatory Visit: Payer: Self-pay | Admitting: Nurse Practitioner

## 2023-05-11 DIAGNOSIS — M6089 Other myositis, multiple sites: Secondary | ICD-10-CM

## 2023-05-11 NOTE — Telephone Encounter (Signed)
 Attempted to contact patient and left message to advise patient to call the office and schedule appointment.

## 2023-05-11 NOTE — Telephone Encounter (Signed)
 Please schedule patient a follow up visit. Patient due March 2025. Thanks!   Follow-Up Instructions: Return in about 3 months (around 05/02/2023) for Myositis on AZA f/u 3mos.

## 2023-05-11 NOTE — Telephone Encounter (Signed)
 Last Fill: 02/12/2023  Labs: 02/23/2023 Glucose 104, BUN 27, BUN/Creat. Ratio 26  Next Visit: Due March 2025. Message sent to the front to schedule.   Last Visit: 02/01/2023  DX: Other myositis of multiple sites   Current Dose per office note 02/01/2023: Azathioprine 150 mg daily   Okay to refill Imuran?

## 2023-05-13 ENCOUNTER — Other Ambulatory Visit: Payer: Self-pay | Admitting: *Deleted

## 2023-05-13 DIAGNOSIS — Z79899 Other long term (current) drug therapy: Secondary | ICD-10-CM

## 2023-05-13 DIAGNOSIS — M6089 Other myositis, multiple sites: Secondary | ICD-10-CM

## 2023-06-17 NOTE — Progress Notes (Unsigned)
 Office Visit Note  Patient: Sean [name]             Date of Birth: 15-Jul-1950           MRN: [id number]             PCP: Ronna Coho, MD Referring: Ronna Coho, MD Visit Date: 06/23/2023   Subjective:  No chief complaint on file.   History of Present Illness: ALIJAH [name] is a 73 y.o. 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    Previous HPI 02/01/2023 Sean [name] is a 73 y.o. 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  His CK level was recently normalized in November with last follow-up with Dr. Bertrum Brodie. They have been off prednisone  for approximately ten days and have not noticed any problems since discontinuing the medication. They are currently on azathioprine , which they take three times a day.   The patient also reports experiencing Raynaud's syndrome, which they have had for a long time. They note that the symptoms have been worse recently, with numb fingers persisting even in warm conditions. They believe their blood pressure medication, a beta-blocker, is exacerbating these symptoms.   The patient also mentions some edema, which they monitor using a ring as a gauge. They had to take Lasix  for two days recently due to swelling. Despite these issues, the patient reports that they are able to do everything they want to do.      Previous HPI 10/14/2022 Sean [name] is a 73 y.o. 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  He has proceeded with gradually tapering down the prednisone  further and had visit with Dr. Bertrum Brodie earlier this month that looked pretty reassuring regarding ILD.  His repeat CK was also downtrending at 278.  He feels his strength has continued to improve with not having difficulties with any particular physical activities or movement anymore.  Leg swelling and hand swelling remains although less severe.  No new trouble with  skin rashes, no coughing, no trouble swallowing.   Previous HPI 07/16/2022 Sean [name] is a 73 y.o. 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  Since her last visit he continues doing pretty well with gradual but consistent improvement in his strength and mobility.  He went on a Viking cruise of the [place] and came back with mild upper respiratory viral illness which resolved without incident.  He had an appointment in pulmonology clinic with Dr. Thelda Finney had chest CT ordered and referred to see Dr. Bertrum Brodie in ILD clinic.  Chest CT obtained on the 28th no report available yet.  He still noticing mild persistent swelling in his legs is not bothering him too much.  Raynaud's symptoms just with cold exposure and not causing any serious pain or long-lasting symptoms.     Previous HPI 06/03/22 Sean [name] is a 73 y.o. male here for follow up for autoimmune myositis currently on azathioprine  150 mg daily and prednisone  20 mg twice daily.  Since her last visit he is continue to stay active and working on his mobility with some additional partial improvement.  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  Leg swelling remains mostly improved.     Previous HPI 05/05/22  Sean [name] is a 73 y.o. male here for follow up for myositis currently on azathioprine  titrating dose now at 100 mg daily and prednisone  60 mg daily.  Since last visit he had EMG testing on March 14 with Dr. 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  He is tolerating the medication okay without major issue.  He is working with physical therapy focusing pretty extensively on his walking and also is doing a lot of shoulder mobility exercises.  He is seen partial improvement in his strength and stiffness with these exercises.   Most recently had a physical therapy session earlier today and has some muscle soreness.  He is also had more frequent nosebleeds reported particularly with bending forward position.  In the past week the frequency decreased he thinks this is from stopping sinus irrigation and applying Vaseline regularly.   Previous HPI 04/13/22 Sean [name] is a 73 y.o. male here for follow up for myositis after initial visit almost 3 weeks ago he continues on prednisone  60 mg daily.  He started working with physical therapy and has noticed very mild improvements in strength for example is able to brace his foot up 1 step at a time unaided on the right side.  No interval worsening of strength elsewhere no swallowing or respiratory difficulties.  That follow-up with Dr. Maryrose Soja last week he had repeat lab test with CK more elevated again to 4,387 up from 1,164 at his hospital discharge.  BNP was normal at 98. He was taking Lasix  at increased dose to daily for edema with some benefit but continues to have swelling in bilateral hands and legs.  Myositis antibody panel has resulted with positive for 52 kDa SSA antibody.  Muscle biopsy result was pretty nonspecific.   03/23/22 Muscle Biopsy A. MUSCLE, LEFT THIGH, BIOPSY:  - Mildly abnormal muscle showing regenerative fibrosis.  Presence of COX negative fibrous could be age-related, but the presence of regenerating fibers could be in agreement with the diagnosis of myositis; however, there are no inflammatory infiltrates.  Clinical correlation is suggested.   Previous HPI 03/26/22 Sean [name] is a 73 y.o. male here for myositis after recent hospital discharge.  He has experienced progressive weakening in proximal muscles as well as increased peripheral edema ongoing for months leading up to this evaluation.  Increase in his swelling and some dyspnea on exertion prompting cardiology evaluation in October and due to abnormal liver function test he was discontinued off of  rosuvastatin  at that time.  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  Over the next few months until January he experienced ongoing unintentional weight loss down about 20 pounds and worsening exertion intolerance difficulty with mobility and noticed progressive hunching over of his shoulders with height loss.  He saw gastroenterology due to the unintentional weight loss and abnormal LFTs with CT scan of the abdomen and pelvis with contrast was entirely normal besides prostate hypertrophy.  He eventually had updated echocardiogram and cardiac PET CT scan for evaluation showing normal function no abnormal metabolic uptake in the associated chest CT was negative for abnormal findings.  He saw Dr. Evy Holding in pulmonology clinic due to his dyspnea despite normal cardiac workup where he was noted to have some myalgias and proximal weakness as well as apparent Raynaud's symptoms with  pallor in several digits prompted concern for connective tissue disease.  Laboratory testing showed highly elevated CK 6777 and aldolase 83.1 and highly positive ANA antibody titer 1:1280.  He was admitted to the hospital and started on high-dose steroids to expedite muscle biopsy with sampling from the left vastus lateralis collected on February 5. He is currently taking prednisone  60 mg daily with plan to taper down by 20 mg/day dose each month and also has plans to follow-up with Dr. Lydia Sams for EMG study.  Remains quite weak compared to his baseline that he is able to walk without assistance just only limited to short distances.  He is noticing some dyspnea and nonproductive cough especially at nighttime when lying flat.  Denies any significant dysphagia.  He is not seeing any of the ongoing discoloration occurring in his fingers and toes.  No new skin rashes.   No Rheumatology ROS completed.   PMFS History:   Patient Active Problem List   Diagnosis Date Noted   Aortic root dilatation (HCC)    ILD (interstitial lung disease) (HCC) 08/07/2022   High risk medication use 05/05/2022   Swelling 04/13/2022   Long term (current) use of systemic steroids 03/26/2022   Pedal edema 03/26/2022   Myositis 03/18/2022   Alcohol  addiction (HCC) 03/18/2022   Hypothyroidism 03/18/2022   Elevated transaminase level 03/18/2022   RBBB 10/24/2015   Coronary artery disease    Tachycardia 05/09/2011   Abnormal liver enzymes 05/06/2011   HTN (hypertension) 04/29/2011   HLD (hyperlipidemia) 04/29/2011    Past Medical History:  Diagnosis Date   Alcohol  addiction (HCC)    2010- admitted for withdrawal , detox admission after that   Aortic root dilatation (HCC)    38mm by echo 08/2022   Arrhythmia    Coronary artery disease    95% mid LAD s/p BMS of LAD with aneurysmal segment   Depression    ED (erectile dysfunction)    HLD (hyperlipidemia)    HTN (hypertension)    ILD (interstitial lung disease) (HCC)    Followed by Pulmonary   Mental disorder    Myositis    Peptic ulcer    RBBB 10/24/2015    Family History  Problem Relation Age of Onset   Prostate cancer Father    Heart attack Brother 2   Anesthesia problems Neg Hx    Hypotension Neg Hx    Malignant hyperthermia Neg Hx    Pseudochol deficiency Neg Hx    Colon cancer Neg Hx    Stomach cancer Neg Hx    Esophageal cancer Neg Hx    Colon polyps Neg Hx    Past Surgical History:  Procedure Laterality Date   CORONARY STENT PLACEMENT     MUSCLE BIOPSY Left 03/23/2022   Procedure: LEFT THIGH MUSCLE BIOPSY;  Surgeon: Anda Bamberg, MD;  Location: [facility] OR;  Service: General;  Laterality: Left;   Social History   Social History Narrative   Live with wife in [place], is an Art gallery manager, currently driving trucks for a local firm. Quit smoking 1990 after smoking for 20 years. Drinks heavilyu (1 bottle of vodka or 18-24 beers a day). Used to smoke  marijuana but quit in 1990s. Has united healthcare insurance. Goes to [employer] for alcohol  cessation   Immunization History  Administered Date(s) Administered   Fluad Quad(high Dose 65+) 10/20/2021   PFIZER(Purple Top)SARS-COV-2 Vaccination 04/01/2019     Objective: Vital Signs: There were no vitals taken for this visit.   Physical  Exam   Musculoskeletal Exam: ***  CDAI Exam: CDAI Score: -- Patient Global: --; Provider Global: -- Swollen: --; Tender: -- Joint Exam 06/23/2023   No joint exam has been documented for this visit   There is currently no information documented on the homunculus. Go to the Rheumatology activity and complete the homunculus joint exam.  Investigation: No additional findings.  Imaging: No results found.  Recent Labs: Lab Results  Component Value Date   WBC 6.0 02/23/2023   HGB 15.0 02/23/2023   PLT 272 02/23/2023   NA 137 02/23/2023   K 4.7 02/23/2023   CL 98 02/23/2023   CO2 31 02/23/2023   GLUCOSE 104 (H) 02/23/2023   BUN 27 (H) 02/23/2023   CREATININE 1.02 02/23/2023   BILITOT 0.4 02/23/2023   ALKPHOS 63 01/05/2023   AST 24 02/23/2023   ALT 19 02/23/2023   PROT 6.8 02/23/2023   ALBUMIN 4.2 01/05/2023   CALCIUM  9.7 02/23/2023   GFRAA >90 05/04/2011    Speciality Comments: No specialty comments available.  Procedures:  No procedures performed Allergies: Patient has no known allergies.   Assessment / Plan:     Visit Diagnoses: No diagnosis found.  ***  Orders: No orders of the defined types were placed in this encounter.  No orders of the defined types were placed in this encounter.    Follow-Up Instructions: No follow-ups on file.   Glena Landau, RT  Note - This record has been created using [employer].  Chart creation errors have been sought, but may not always  have been located. Such creation errors do not reflect on  the standard of medical care.

## 2023-06-23 ENCOUNTER — Ambulatory Visit: Attending: Internal Medicine | Admitting: Internal Medicine

## 2023-06-23 ENCOUNTER — Other Ambulatory Visit: Payer: Self-pay | Admitting: Internal Medicine

## 2023-06-23 ENCOUNTER — Encounter: Payer: Self-pay | Admitting: Internal Medicine

## 2023-06-23 VITALS — BP 128/72 | HR 64 | Resp 14 | Ht 70.0 in | Wt 203.0 lb

## 2023-06-23 DIAGNOSIS — R600 Localized edema: Secondary | ICD-10-CM

## 2023-06-23 DIAGNOSIS — E039 Hypothyroidism, unspecified: Secondary | ICD-10-CM | POA: Diagnosis not present

## 2023-06-23 DIAGNOSIS — M6089 Other myositis, multiple sites: Secondary | ICD-10-CM

## 2023-06-23 DIAGNOSIS — Z79899 Other long term (current) drug therapy: Secondary | ICD-10-CM

## 2023-06-23 MED ORDER — AZATHIOPRINE 50 MG PO TABS
100.0000 mg | ORAL_TABLET | Freq: Every day | ORAL | 0 refills | Status: DC
Start: 2023-06-23 — End: 2023-06-24

## 2023-06-24 ENCOUNTER — Telehealth: Payer: Self-pay | Admitting: *Deleted

## 2023-06-24 LAB — CBC WITH DIFFERENTIAL/PLATELET
Absolute Lymphocytes: 988 {cells}/uL (ref 850–3900)
Absolute Monocytes: 865 {cells}/uL (ref 200–950)
Basophils Absolute: 33 {cells}/uL (ref 0–200)
Basophils Relative: 0.5 %
Eosinophils Absolute: 78 {cells}/uL (ref 15–500)
Eosinophils Relative: 1.2 %
HCT: 46.3 % (ref 38.5–50.0)
Hemoglobin: 15.5 g/dL (ref 13.2–17.1)
MCH: 33.1 pg — ABNORMAL HIGH (ref 27.0–33.0)
MCHC: 33.5 g/dL (ref 32.0–36.0)
MCV: 98.9 fL (ref 80.0–100.0)
MPV: 10.6 fL (ref 7.5–12.5)
Monocytes Relative: 13.3 %
Neutro Abs: 4537 {cells}/uL (ref 1500–7800)
Neutrophils Relative %: 69.8 %
Platelets: 265 10*3/uL (ref 140–400)
RBC: 4.68 10*6/uL (ref 4.20–5.80)
RDW: 13.2 % (ref 11.0–15.0)
Total Lymphocyte: 15.2 %
WBC: 6.5 10*3/uL (ref 3.8–10.8)

## 2023-06-24 LAB — COMPREHENSIVE METABOLIC PANEL WITH GFR
AG Ratio: 1.6 (calc) (ref 1.0–2.5)
ALT: 13 U/L (ref 9–46)
AST: 22 U/L (ref 10–35)
Albumin: 4.1 g/dL (ref 3.6–5.1)
Alkaline phosphatase (APISO): 80 U/L (ref 35–144)
BUN: 25 mg/dL (ref 7–25)
CO2: 32 mmol/L (ref 20–32)
Calcium: 9.1 mg/dL (ref 8.6–10.3)
Chloride: 104 mmol/L (ref 98–110)
Creat: 1.1 mg/dL (ref 0.70–1.28)
Globulin: 2.6 g/dL (ref 1.9–3.7)
Glucose, Bld: 88 mg/dL (ref 65–99)
Potassium: 4.4 mmol/L (ref 3.5–5.3)
Sodium: 140 mmol/L (ref 135–146)
Total Bilirubin: 0.5 mg/dL (ref 0.2–1.2)
Total Protein: 6.7 g/dL (ref 6.1–8.1)
eGFR: 71 mL/min/{1.73_m2} (ref >=60)

## 2023-06-24 LAB — TSH: TSH: 1.97 m[IU]/L (ref 0.40–4.50)

## 2023-06-24 LAB — CK: Total CK: 103 U/L (ref 19–278)

## 2023-06-24 MED ORDER — AZATHIOPRINE 50 MG PO TABS: 100.0000 mg | ORAL_TABLET | Freq: Every day | ORAL | Status: DC

## 2023-06-24 NOTE — Progress Notes (Signed)
 CK was 103 which is normal.  Blood counts and kidney and liver function test are normal.  I recommend he go ahead on the azathioprine  down to 100 mg daily.

## 2023-06-24 NOTE — Telephone Encounter (Signed)
-----   Message from Haig Levan [facility] sent at 06/24/2023  7:56 AM EDT ----- CK was 103 which is normal.  Blood counts and kidney and liver function test are normal.  I recommend he go ahead on the azathioprine  down to 100 mg daily.

## 2023-07-07 DIAGNOSIS — R972 Elevated prostate specific antigen [PSA]: Secondary | ICD-10-CM | POA: Diagnosis not present

## 2023-07-22 ENCOUNTER — Other Ambulatory Visit: Payer: Self-pay

## 2023-07-22 DIAGNOSIS — R600 Localized edema: Secondary | ICD-10-CM

## 2023-07-22 DIAGNOSIS — M3320 Polymyositis, organ involvement unspecified: Secondary | ICD-10-CM

## 2023-07-22 DIAGNOSIS — R000 Tachycardia, unspecified: Secondary | ICD-10-CM

## 2023-07-22 DIAGNOSIS — I10 Essential (primary) hypertension: Secondary | ICD-10-CM

## 2023-07-22 NOTE — Progress Notes (Signed)
 Echo order expired, re-placed.

## 2023-08-09 DIAGNOSIS — H35722 Serous detachment of retinal pigment epithelium, left eye: Secondary | ICD-10-CM | POA: Diagnosis not present

## 2023-08-09 DIAGNOSIS — H2513 Age-related nuclear cataract, bilateral: Secondary | ICD-10-CM | POA: Diagnosis not present

## 2023-08-09 DIAGNOSIS — H353111 Nonexudative age-related macular degeneration, right eye, early dry stage: Secondary | ICD-10-CM | POA: Diagnosis not present

## 2023-08-09 DIAGNOSIS — H43812 Vitreous degeneration, left eye: Secondary | ICD-10-CM | POA: Diagnosis not present

## 2023-08-09 DIAGNOSIS — H35363 Drusen (degenerative) of macula, bilateral: Secondary | ICD-10-CM | POA: Diagnosis not present

## 2023-08-09 DIAGNOSIS — H353122 Nonexudative age-related macular degeneration, left eye, intermediate dry stage: Secondary | ICD-10-CM | POA: Diagnosis not present

## 2023-08-18 ENCOUNTER — Telehealth: Payer: Self-pay | Admitting: Cardiology

## 2023-08-18 NOTE — Telephone Encounter (Signed)
 Patient dropped off a form from [facility] (Cardiac Condition Check List).  Patient signed the release of information but was not required to pay $29 form fee for this form.  Form in Dr. Dorine box.  I will call patient to come pick up the form when it is complete.

## 2023-08-24 ENCOUNTER — Telehealth: Payer: Self-pay | Admitting: Cardiology

## 2023-08-24 NOTE — Telephone Encounter (Signed)
 Spoke with the patient and advised that his forms had not been filled out yet. He is needing to complete his echo and have follow up appointment before forms can be filled out. Both are scheduled for 7/22.

## 2023-08-24 NOTE — Telephone Encounter (Signed)
 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 Please advise

## 2023-09-01 DIAGNOSIS — R350 Frequency of micturition: Secondary | ICD-10-CM | POA: Diagnosis not present

## 2023-09-01 DIAGNOSIS — R3912 Poor urinary stream: Secondary | ICD-10-CM | POA: Diagnosis not present

## 2023-09-01 DIAGNOSIS — N401 Enlarged prostate with lower urinary tract symptoms: Secondary | ICD-10-CM | POA: Diagnosis not present

## 2023-09-01 DIAGNOSIS — N5201 Erectile dysfunction due to arterial insufficiency: Secondary | ICD-10-CM | POA: Diagnosis not present

## 2023-09-01 DIAGNOSIS — R972 Elevated prostate specific antigen [PSA]: Secondary | ICD-10-CM | POA: Diagnosis not present

## 2023-09-04 NOTE — Progress Notes (Unsigned)
 Cardiology Office Note   Date:  09/07/2023  ID:  Sean [name], DOB 05-Aug-1950, MRN [id number] PCP: Kip Righter, MD   HeartCare Providers Cardiologist:  Wilbert Bihari, MD {  History of Present Illness Sean [name] is a 73 y.o. male with past medical history of CAD status post BMS to mid dyslipidemia, PAD followed by pulmonary, Myositis and hypertension.  Here for follow-up appointment.  Assessment 08/07/2023.  He was doing well. Denied CP, SOB, DOE, PND, extremity edema, dizziness, palpitations or syncope.  He is compliant with medication and tolerating well with no side effects.  Today, he presents for a DOT physical exam.  He experiences circulatory issues, including numbness in his fingers when cold, attributed to Raynaud's syndrome. He uses hand warmers for management. He has tried Cialis for other circulatory issues, which provides some relief.  Current medications include Lasix , metoprolol , and Plavix . He avoids cholesterol medication due to past severe muscle pain and weakness from statins.  A bundle branch block was noted on an EKG in December 2023, with no new symptoms reported. He denies chest pain, shortness of breath, bleeding issues, or fluid accumulation  Reports no shortness of breath nor dyspnea on exertion. Reports no chest pain, pressure, or tightness. No edema, orthopnea, PND. Reports no palpitations.   Discussed the use of AI scribe software for clinical note transcription with the patient, who gave verbal consent to proceed.   ROS: pertinent ROS in HPI  Studies Reviewed  09/03/22 echo     IMPRESSIONS     1. Left ventricular ejection fraction, by estimation, is 55 to 60%. The  left ventricle has normal function. The left ventricle has no regional  wall motion abnormalities. There is mild concentric left ventricular  hypertrophy. Left ventricular diastolic  parameters were normal.   2. Right ventricular systolic function is normal. The right  ventricular  size is normal. Tricuspid regurgitation signal is inadequate for assessing  PA pressure.   3. Right atrial size was mildly dilated.   4. The mitral valve is normal in structure. Trivial mitral valve  regurgitation. No evidence of mitral stenosis.   5. The aortic valve is tricuspid. There is mild calcification of the  aortic valve. Aortic valve regurgitation is not visualized. No aortic  stenosis is present.   6. Aortic dilatation noted. There is mild dilatation of the aortic root,  measuring 38 mm.   7. The inferior vena cava is normal in size with greater than 50%  respiratory variability, suggesting right atrial pressure of 3 mmHg.   FINDINGS   Left Ventricle: Left ventricular ejection fraction, by estimation, is 55  to 60%. The left ventricle has normal function. The left ventricle has no  regional wall motion abnormalities. The left ventricular internal cavity  size was normal in size. There is   mild concentric left ventricular hypertrophy. Left ventricular diastolic  parameters were normal.   Right Ventricle: The right ventricular size is normal. No increase in  right ventricular wall thickness. Right ventricular systolic function is  normal. Tricuspid regurgitation signal is inadequate for assessing PA  pressure.   Left Atrium: Left atrial size was normal in size.   Right Atrium: Right atrial size was mildly dilated.   Pericardium: There is no evidence of pericardial effusion.   Mitral Valve: The mitral valve is normal in structure. Trivial mitral  valve regurgitation. No evidence of mitral valve stenosis.   Tricuspid Valve: The tricuspid valve is normal in structure. Tricuspid  valve regurgitation  is not demonstrated.   Aortic Valve: The aortic valve is tricuspid. There is mild calcification  of the aortic valve. Aortic valve regurgitation is not visualized. No  aortic stenosis is present.   Pulmonic Valve: The pulmonic valve was normal in structure.  Pulmonic valve  regurgitation is not visualized.   Aorta: Aortic dilatation noted. There is mild dilatation of the aortic  root, measuring 38 mm.   Venous: The inferior vena cava is normal in size with greater than 50%  respiratory variability, suggesting right atrial pressure of 3 mmHg.   IAS/Shunts: No atrial level shunt detected by color flow Doppler.   Physical Exam VS:  BP 122/60   Pulse 63   Ht 5' 10 (1.778 m)   Wt 209 lb 4.8 oz (94.9 kg)   SpO2 95%   BMI 30.03 kg/m        Wt Readings from Last 3 Encounters:  09/07/23 209 lb 4.8 oz (94.9 kg)  06/23/23 203 lb (92.1 kg)  02/01/23 204 lb (92.5 kg)    GEN: Well nourished, well developed in no acute distress NECK: No JVD; No carotid bruits CARDIAC: RRR, no murmurs, rubs, gallops RESPIRATORY:  Clear to auscultation without rales, wheezing or rhonchi  ABDOMEN: Soft, non-tender, non-distended EXTREMITIES:  No edema; No deformity   ASSESSMENT AND PLAN  Raynaud's syndrome Significant numbness in fingers, especially in cold weather. Discussed potential benefits of calcium  channel blockers like amlodipine  for symptom relief. Amlodipine  expected to help without significantly affecting blood pressure at low dose. - Prescribe amlodipine  2.5 mg once daily. - Monitor blood pressure regularly and report any significant drops.  Myositis Condition well-controlled with azathioprine . Significant recovery of muscular strength. No current mobility issues.  Right bundle branch block Confirmed on current EKG, present since at least December 2023. No new changes or concerns.  General Health Maintenance LDL slightly elevated at 80. No cholesterol medication due to previous severe myalgias and muscle weakness associated with statins.      Dispo: He can follow-up in 6 months with Dr. Shlomo.  Signed, Orren LOISE Fabry, PA-C

## 2023-09-07 ENCOUNTER — Ambulatory Visit: Attending: Cardiology | Admitting: Physician Assistant

## 2023-09-07 ENCOUNTER — Encounter (HOSPITAL_COMMUNITY): Payer: Self-pay

## 2023-09-07 ENCOUNTER — Ambulatory Visit: Payer: Self-pay | Admitting: Cardiology

## 2023-09-07 ENCOUNTER — Ambulatory Visit (HOSPITAL_COMMUNITY)
Admission: RE | Admit: 2023-09-07 | Discharge: 2023-09-07 | Disposition: A | Discharge status: Home / Self Care | Source: Ambulatory Visit | Attending: Cardiology | Admitting: Cardiology

## 2023-09-07 ENCOUNTER — Encounter: Payer: Self-pay | Admitting: Physician Assistant

## 2023-09-07 ENCOUNTER — Encounter: Payer: Self-pay | Admitting: Cardiology

## 2023-09-07 VITALS — BP 122/60 | HR 63 | Ht 70.0 in | Wt 209.3 lb

## 2023-09-07 DIAGNOSIS — M3320 Polymyositis, organ involvement unspecified: Secondary | ICD-10-CM

## 2023-09-07 DIAGNOSIS — E785 Hyperlipidemia, unspecified: Secondary | ICD-10-CM | POA: Diagnosis not present

## 2023-09-07 DIAGNOSIS — I10 Essential (primary) hypertension: Secondary | ICD-10-CM

## 2023-09-07 DIAGNOSIS — I2510 Atherosclerotic heart disease of native coronary artery without angina pectoris: Secondary | ICD-10-CM | POA: Diagnosis not present

## 2023-09-07 DIAGNOSIS — R600 Localized edema: Secondary | ICD-10-CM | POA: Diagnosis not present

## 2023-09-07 DIAGNOSIS — R000 Tachycardia, unspecified: Secondary | ICD-10-CM | POA: Diagnosis not present

## 2023-09-07 LAB — ECHOCARDIOGRAM COMPLETE
Area-P 1/2: 3.21 cm2
Height: 70 in
S' Lateral: 2.9 cm
Weight: 3348.8 [oz_av]

## 2023-09-07 MED ORDER — CLOPIDOGREL BISULFATE 75 MG PO TABS: 75.0000 mg | ORAL_TABLET | Freq: Every day | ORAL | 3 refills | Status: AC

## 2023-09-07 MED ORDER — NITROGLYCERIN 0.4 MG SL SUBL: 0.4000 mg | SUBLINGUAL_TABLET | SUBLINGUAL | 3 refills | Status: AC | PRN

## 2023-09-07 MED ORDER — METOPROLOL SUCCINATE ER 50 MG PO TB24: ORAL_TABLET | ORAL | 1 refills | Status: AC

## 2023-09-07 MED ORDER — EZETIMIBE 10 MG PO TABS: 10.0000 mg | ORAL_TABLET | Freq: Every day | ORAL | 3 refills | Status: AC

## 2023-09-07 MED ORDER — FUROSEMIDE 20 MG PO TABS: 20.0000 mg | ORAL_TABLET | Freq: Every day | ORAL | 0 refills | Status: DC

## 2023-09-07 MED ORDER — AMLODIPINE BESYLATE 2.5 MG PO TABS: 2.5000 mg | ORAL_TABLET | Freq: Every day | ORAL | 1 refills | Status: DC

## 2023-09-07 NOTE — Patient Instructions (Addendum)
 Medication Instructions:  Start taking Amlodipine  2.5 mg by mouth daily  Refilled all your cardiac medicine   *If you need a refill on your cardiac medications before your next appointment, please call your pharmacy*  Lab Work: NONE If you have labs (blood work) drawn today and your tests are completely normal, you will receive your results only by: MyChart Message (if you have MyChart) OR A paper copy in the mail If you have any lab test that is abnormal or we need to change your treatment, we will call you to review the results.  Testing/Procedures: NONE  Follow-Up: At [facility], you and your health needs are our priority.  As part of our continuing mission to provide you with exceptional heart care, our providers are all part of one team.  This team includes your primary Cardiologist (physician) and Advanced Practice Providers or APPs (Physician Assistants and Nurse Practitioners) who all work together to provide you with the care you need, when you need it.  Your next appointment:   1 year(s)  Provider:   Wilbert Bihari, MD    Other Instructions Your physician has requested that you regularly monitor and record your blood pressure readings at home. Please use the same machine at the same time of day to check your readings and record them to bring to your follow-up visit. Please send us  your readings to us  in MyChart in 2 weeks.    Blood Pressure Record Sheet To take your blood pressure, you will need a blood pressure machine. You can buy a blood pressure machine (blood pressure monitor) at your clinic, drug store, or online. When choosing one, consider: An automatic monitor that has an arm cuff. A cuff that wraps snugly around your upper arm. You should be able to fit only one finger between your arm and the cuff. A device that stores blood pressure reading results. Do not choose a monitor that measures your blood pressure from your wrist or finger. Follow your  health care provider's instructions for how to take your blood pressure. To use this form: Take your blood pressure medications every day These measurements should be taken when you have been at rest for at least 10-15 min Take at least 2 readings with each blood pressure check. This makes sure the results are correct. Wait 1-2 minutes between measurements. Write down the results in the spaces on this form. Keep in mind it should always be recorded systolic over diastolic. Both numbers are important.  Repeat this every day for 2-3 weeks, or as told by your health care provider.  Make a follow-up appointment with your health care provider to discuss the results.  Blood Pressure Log Date Medications taken? (Y/N) Blood Pressure Time of Day

## 2023-09-13 ENCOUNTER — Encounter: Payer: Self-pay | Admitting: Internal Medicine

## 2023-09-13 DIAGNOSIS — M6089 Other myositis, multiple sites: Secondary | ICD-10-CM

## 2023-09-13 DIAGNOSIS — J849 Interstitial pulmonary disease, unspecified: Secondary | ICD-10-CM

## 2023-09-13 MED ORDER — AZATHIOPRINE 50 MG PO TABS: 100.0000 mg | ORAL_TABLET | Freq: Every day | ORAL | 0 refills | Status: DC

## 2023-09-16 DIAGNOSIS — H2513 Age-related nuclear cataract, bilateral: Secondary | ICD-10-CM | POA: Diagnosis not present

## 2023-09-16 DIAGNOSIS — H353132 Nonexudative age-related macular degeneration, bilateral, intermediate dry stage: Secondary | ICD-10-CM | POA: Diagnosis not present

## 2023-09-16 DIAGNOSIS — H43812 Vitreous degeneration, left eye: Secondary | ICD-10-CM | POA: Diagnosis not present

## 2023-09-16 DIAGNOSIS — H35722 Serous detachment of retinal pigment epithelium, left eye: Secondary | ICD-10-CM | POA: Diagnosis not present

## 2023-09-16 DIAGNOSIS — H35363 Drusen (degenerative) of macula, bilateral: Secondary | ICD-10-CM | POA: Diagnosis not present

## 2023-09-24 DIAGNOSIS — N529 Male erectile dysfunction, unspecified: Secondary | ICD-10-CM | POA: Diagnosis not present

## 2023-09-24 DIAGNOSIS — R399 Unspecified symptoms and signs involving the genitourinary system: Secondary | ICD-10-CM | POA: Diagnosis not present

## 2023-09-24 DIAGNOSIS — R7309 Other abnormal glucose: Secondary | ICD-10-CM | POA: Diagnosis not present

## 2023-09-24 DIAGNOSIS — R972 Elevated prostate specific antigen [PSA]: Secondary | ICD-10-CM | POA: Diagnosis not present

## 2023-09-27 ENCOUNTER — Ambulatory Visit: Admitting: Internal Medicine

## 2023-09-27 DIAGNOSIS — M359 Systemic involvement of connective tissue, unspecified: Secondary | ICD-10-CM

## 2023-09-27 DIAGNOSIS — J8489 Other specified interstitial pulmonary diseases: Secondary | ICD-10-CM | POA: Diagnosis not present

## 2023-09-27 LAB — PULMONARY FUNCTION TEST
DL/VA % pred: 100 %
DL/VA: 4.01 ml/min/mmHg/L
DLCO cor % pred: 81 %
DLCO cor: 20.87 ml/min/mmHg
DLCO unc % pred: 81 %
DLCO unc: 20.87 ml/min/mmHg
FEF 25-75 Pre: 2.48 L/s
FEF2575-%Pred-Pre: 104 %
FEV1-%Pred-Pre: 81 %
FEV1-Pre: 2.58 L
FEV1FVC-%Pred-Pre: 108 %
FEV6-%Pred-Pre: 79 %
FEV6-Pre: 3.25 L
FEV6FVC-%Pred-Pre: 105 %
FVC-%Pred-Pre: 74 %
FVC-Pre: 3.26 L
Pre FEV1/FVC ratio: 79 %
Pre FEV6/FVC Ratio: 100 %

## 2023-09-27 NOTE — Progress Notes (Signed)
 Spirometry and diffusion capacity performed today.

## 2023-09-27 NOTE — Patient Instructions (Signed)
 Spirometry/DLCO performed today.

## 2023-09-27 NOTE — Progress Notes (Unsigned)
 Office Visit Note  Patient: Sean [name]             Date of Birth: 1950-11-05           MRN: [id number]             PCP: Kip Righter, MD Referring: Kip Righter, MD Visit Date: 10/11/2023   Subjective:  Follow-up   History of Present Illness: Sean [name] is a 73 y.o. 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  Raynauds w/o complication Al looks good ***  Previous HPI 06/23/2023 Sean [name] is a 73 y.o. 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     He has been active and walking, feeling confident in his physical abilities. No recent illnesses have been noted. He experienced a mix-up with his pill container while traveling, leading to taking a different number of pills only on 100 mg of azathioprine  on several days.   He experiences circulation issues in his hands, describing poor circulation. He previously used Cialis for this issue, which he found helpful, but has not taken it recently as he feels it is less necessary with warmer weather. 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   He experienced very dry hands and sinuses while at high altitudes during a recent trip to [place] , where he rented an RV and [place]. He took only one dose of Lasix  in the past week and avoids taking it when he has to drive in the morning.     Previous HPI 02/01/2023 Sean [name] is a 73 y.o. 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  His CK level was recently normalized in November with last follow-up with Dr. Geronimo. They have been off prednisone  for approximately ten days and have not noticed any problems since discontinuing the medication. They are currently on azathioprine , which they take three times a day.   The patient also reports experiencing Raynaud's syndrome, which they have had for a long time. They note that the symptoms  have been worse recently, with numb fingers persisting even in warm conditions. They believe their blood pressure medication, a beta-blocker, is exacerbating these symptoms.   The patient also mentions some edema, which they monitor using a ring as a gauge. They had to take Lasix  for two days recently due to swelling. Despite these issues, the patient reports that they are able to do everything they want to do.      Previous HPI 10/14/2022 Sean [name] is a 73 y.o. 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  He has proceeded with gradually tapering down the prednisone  further and had visit with Dr. Geronimo earlier this month that looked pretty reassuring regarding ILD.  His repeat CK was also downtrending at 278.  He feels his strength has continued to improve with not having difficulties with any particular physical activities or movement anymore.  Leg swelling and hand swelling remains although less severe.  No new trouble with skin rashes, no coughing, no trouble swallowing.   Previous HPI 07/16/2022 Sean [name] is a 73 y.o. 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  Since her last visit he continues doing pretty well with gradual but consistent improvement in  his strength and mobility.  He went on a [place] cruise of the [place] and came back with mild upper respiratory viral illness which resolved without incident.  He had an appointment in pulmonology clinic with Dr. Brenna had chest CT ordered and referred to see Dr. Geronimo in ILD clinic.  Chest CT obtained on the 28th no report available yet.  He still noticing mild persistent swelling in his legs is not bothering him too much.  Raynaud's symptoms just with cold exposure and not causing any serious pain or long-lasting symptoms.     Previous HPI 06/03/22 Sean [name] is a 73 y.o. male here for follow up for autoimmune  myositis currently on azathioprine  150 mg daily and prednisone  20 mg twice daily.  Since her last visit he is continue to stay active and working on his mobility with some additional partial improvement.  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  Leg swelling remains mostly improved.     Previous HPI 05/05/22 Sean [name] is a 73 y.o. male here for follow up for myositis currently on azathioprine  titrating dose now at 100 mg daily and prednisone  60 mg daily.  Since last visit he had EMG testing on March 14 with Dr. 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  He is tolerating the medication okay without major issue.  He is working with physical therapy focusing pretty extensively on his walking and also is doing a lot of shoulder mobility exercises.  He is seen partial improvement in his strength and stiffness with these exercises.  Most recently had a physical therapy session earlier today and has some muscle soreness.  He is also had more frequent nosebleeds reported particularly with bending forward position.  In the past week the frequency decreased he thinks this is from stopping sinus irrigation and applying Vaseline regularly.   Previous HPI 04/13/22 Sean [name] is a 73 y.o. male here for follow up for myositis after initial visit almost 3 weeks ago he continues on prednisone  60 mg daily.  He started working with physical therapy and has noticed very mild improvements in strength for example is able to brace his foot up 1 step at a time unaided on the right side.  No interval worsening of strength elsewhere no swallowing or respiratory difficulties.  That follow-up with Dr. Kip last week he had repeat lab test with CK more elevated again to 4,387 up from 1,164 at his hospital discharge.  BNP was normal at 98. He was taking Lasix  at  increased dose to daily for edema with some benefit but continues to have swelling in bilateral hands and legs.  Myositis antibody panel has resulted with positive for 52 kDa SSA antibody.  Muscle biopsy result was pretty nonspecific.   03/23/22 Muscle Biopsy A. MUSCLE, LEFT THIGH, BIOPSY:  - Mildly abnormal muscle showing regenerative fibrosis.  Presence of COX negative fibrous could be age-related, but the presence of regenerating fibers could be in agreement with the diagnosis of myositis; however, there are no inflammatory infiltrates.  Clinical correlation is suggested.   Previous HPI 03/26/22 Sean [name] is a 73 y.o. male here for myositis after recent hospital discharge.  He has experienced progressive weakening in proximal muscles as well as increased peripheral edema ongoing for months leading up to this evaluation.  Increase in his swelling and some  dyspnea on exertion prompting cardiology evaluation in October and due to abnormal liver function test he was discontinued off of rosuvastatin  at that time.  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  Over the next few months until January he experienced ongoing unintentional weight loss down about 20 pounds and worsening exertion intolerance difficulty with mobility and noticed progressive hunching over of his shoulders with height loss.  He saw gastroenterology due to the unintentional weight loss and abnormal LFTs with CT scan of the abdomen and pelvis with contrast was entirely normal besides prostate hypertrophy.  He eventually had updated echocardiogram and cardiac PET CT scan for evaluation showing normal function no abnormal metabolic uptake in the associated chest CT was negative for abnormal findings.  He saw Dr. Michel in pulmonology clinic due to his dyspnea despite normal cardiac workup where he was noted to have some  myalgias and proximal weakness as well as apparent Raynaud's symptoms with pallor in several digits prompted concern for connective tissue disease.  Laboratory testing showed highly elevated CK 6777 and aldolase 83.1 and highly positive ANA antibody titer 1:1280.  He was admitted to the hospital and started on high-dose steroids to expedite muscle biopsy with sampling from the left vastus lateralis collected on February 5. He is currently taking prednisone  60 mg daily with plan to taper down by 20 mg/day dose each month and also has plans to follow-up with Dr. Tobie for EMG study.  Remains quite weak compared to his baseline that he is able to walk without assistance just only limited to short distances.  He is noticing some dyspnea and nonproductive cough especially at nighttime when lying flat.  Denies any significant dysphagia.  He is not seeing any of the ongoing discoloration occurring in his fingers and toes.  No new skin rashes.   Review of Systems  Constitutional:  Negative for fatigue.  HENT:  Negative for mouth sores and mouth dryness.   Eyes:  Negative for dryness.  Respiratory:  Negative for shortness of breath.   Cardiovascular:  Negative for chest pain and palpitations.  Gastrointestinal:  Negative for blood in stool, constipation and diarrhea.  Endocrine: Negative for increased urination.  Genitourinary:  Negative for involuntary urination.  Musculoskeletal:  Positive for morning stiffness. Negative for joint pain, gait problem, joint pain, joint swelling, myalgias, muscle weakness, muscle tenderness and myalgias.  Skin:  Negative for color change, rash, hair loss and sensitivity to sunlight.  Allergic/Immunologic: Negative for susceptible to infections.  Neurological:  Negative for dizziness and headaches.  Hematological:  Negative for swollen glands.  Psychiatric/Behavioral:  Negative for depressed mood and sleep disturbance. The patient is not nervous/anxious.     PMFS History:   Patient Active Problem List   Diagnosis Date Noted   Aortic root dilatation (HCC)    ILD (interstitial lung disease) (HCC) 08/07/2022   High risk medication use 05/05/2022   Swelling 04/13/2022   Long term (current) use of systemic steroids 03/26/2022   Pedal edema 03/26/2022   Myositis 03/18/2022   Alcohol  addiction (HCC) 03/18/2022   Hypothyroidism 03/18/2022   Elevated transaminase level 03/18/2022   RBBB 10/24/2015   Coronary artery disease    Tachycardia 05/09/2011   Abnormal liver enzymes 05/06/2011   HTN (hypertension) 04/29/2011   HLD (hyperlipidemia) 04/29/2011    Past Medical History:  Diagnosis Date   Alcohol  addiction (HCC)  2010- admitted for withdrawal , detox admission after that   Aortic root dilatation (HCC)    38mm by echo 08/2022   Arrhythmia    Coronary artery disease    95% mid LAD s/p BMS of LAD with aneurysmal segment   Depression    ED (erectile dysfunction)    HLD (hyperlipidemia)    HTN (hypertension)    ILD (interstitial lung disease) (HCC)    Followed by Pulmonary   Mental disorder    Myositis    Peptic ulcer    RBBB 10/24/2015    Family History  Problem Relation Age of Onset   Prostate cancer Father    Heart attack Brother 22   Anesthesia problems Neg Hx    Hypotension Neg Hx    Malignant hyperthermia Neg Hx    Pseudochol deficiency Neg Hx    Colon cancer Neg Hx    Stomach cancer Neg Hx    Esophageal cancer Neg Hx    Colon polyps Neg Hx    Past Surgical History:  Procedure Laterality Date   CORONARY STENT PLACEMENT     MUSCLE BIOPSY Left 03/23/2022   Procedure: LEFT THIGH MUSCLE BIOPSY;  Surgeon: Paola Dreama SAILOR, MD;  Location: [facility] OR;  Service: General;  Laterality: Left;   Social History   Social History Narrative   Live with wife in [place], is an Art gallery manager, currently driving trucks for a local firm. Quit smoking 1990 after smoking for 20 years. Drinks heavilyu (1 bottle of vodka or 18-24 beers a day). Used to smoke  marijuana but quit in 1990s. Has united healthcare insurance. Goes to [employer] for alcohol  cessation   Immunization History  Administered Date(s) Administered   Fluad Quad(high Dose 65+) 10/20/2021   PFIZER(Purple Top)SARS-COV-2 Vaccination 04/01/2019   Pneumococcal Conjugate-13 10/05/2016   Pneumococcal Polysaccharide-23 03/11/2018     Objective: Vital Signs: BP (!) 106/57 (BP Location: Right Arm, Patient Position: Sitting, Cuff Size: Normal)   Pulse 65   Resp 16   Ht 5' 10 (1.778 m)   Wt 208 lb (94.3 kg)   BMI 29.84 kg/m    Physical Exam   Musculoskeletal Exam: ***  CDAI Exam: CDAI Score: -- Patient Global: --; Provider Global: -- Swollen: --; Tender: -- Joint Exam 10/11/2023   No joint exam has been documented for this visit   There is currently no information documented on the homunculus. Go to the Rheumatology activity and complete the homunculus joint exam.  Investigation: No additional findings.  Imaging: No results found.   Recent Labs: Lab Results  Component Value Date   WBC 6.5 06/23/2023   HGB 15.5 06/23/2023   PLT 265 06/23/2023   NA 140 06/23/2023   K 4.4 06/23/2023   CL 104 06/23/2023   CO2 32 06/23/2023   GLUCOSE 88 06/23/2023   BUN 25 06/23/2023   CREATININE 1.10 06/23/2023   BILITOT 0.5 06/23/2023   ALKPHOS 63 01/05/2023   AST 22 06/23/2023   ALT 13 06/23/2023   PROT 6.7 06/23/2023   ALBUMIN 4.2 01/05/2023   CALCIUM  9.1 06/23/2023   GFRAA >90 05/04/2011    Speciality Comments: No specialty comments available.  Procedures:  No procedures performed Allergies: Patient has no known allergies.   Assessment / Plan:     Visit Diagnoses: Other myositis of multiple sites  High risk medication use - azathioprine  to 100 mg daily  Pedal edema - Lasix  20 mg as needed  Hypothyroidism, unspecified type  ***  Orders: No orders  of the defined types were placed in this encounter.  No orders of the defined types were placed in this  encounter.    Follow-Up Instructions: No follow-ups on file.   Lonni LELON Ester, MD  Note - This record has been created using [employer].  Chart creation errors have been sought, but may not always  have been located. Such creation errors do not reflect on  the standard of medical care.

## 2023-10-07 ENCOUNTER — Ambulatory Visit (INDEPENDENT_AMBULATORY_CARE_PROVIDER_SITE_OTHER): Admitting: Internal Medicine

## 2023-10-07 VITALS — BP 112/70 | HR 67 | Ht 70.0 in | Wt 210.0 lb

## 2023-10-07 DIAGNOSIS — M359 Systemic involvement of connective tissue, unspecified: Secondary | ICD-10-CM

## 2023-10-07 DIAGNOSIS — J849 Interstitial pulmonary disease, unspecified: Secondary | ICD-10-CM | POA: Diagnosis not present

## 2023-10-07 DIAGNOSIS — Z79899 Other long term (current) drug therapy: Secondary | ICD-10-CM

## 2023-10-07 NOTE — Progress Notes (Addendum)
 IOV 03/11/22 - Patient of Dr Brenna   This is a 73 year old gentleman, history of alcohol  abuse, coronary artery disease, depression, hypertension, hyperlipidemia.  Patient is a former smoker quit in 19 90, 34-pack-year history. He trys to walk a lot. He has dogs that he walks regularly. Over the past 4 months he has had increased sob and fatigue. Distances and inclines make walking worse. He saw cardiology and they did several studies and everything seemed fine. He was sent to GI for evaluation for elevated LFTs, still drinking alcohol  daily.  He feels like he is becoming weaker and weaker over the past couple of months.  He is finding that he is having pains in his muscles and when he describes this he points to locations in large muscle groups and side of the thigh as well as shoulders.  He is also noticed ongoing weight loss.  He also has recurrent episodes of Raynaud's of the hands.  OV 03/18/2022: Here today for evaluation of recent abnormal labs.  His CK is elevated, aldolase elevated, ANA titer was positive.  For some reason his myositis antibody panel was canceled.  04/08/2022 Patient was seen last month for shortness of breath/dyspnea symptoms. He was dx with inflammatory myositis recently. Currently on prednisone  60mg  and following with rheumatology.   No issues with shortness of breath. Congestion is a lot better since being discharged from the hospital. His main complaint is muscle fatigue. He first noticed weakness last spring. He has no muscle pain except for chronic lower back pain. He has seen some improvement in his functioning since starting steroids. He is able to do a little bit more than he could last week. He is getting physical therapy. After therapy he was tired for the rest of the day.   He also has generalized fluid retention that started before he began taking high dose steroids, he increased lasix  40mg  for a couple of days. He has cut back on alcohol  but still having 2-3  drinks every couple of days. Following with Dr. Tobie with neurology , planning for EMG and NCS.   OV 06/15/2022: Doing well today.  He feels so much better.  He is stronger.  Tolerating his Imuran  plus prednisone .  He had pulmonary function test completed which reviewed today in the office which shows a reduced FVC.  His DLCO however is normal.  He does have some crackles on exam has not had any axial CT imaging.  Still feels dyspneic when he exerts himself.  OV 07/28/2022 -transfer of care from Dr. Adine Icard to Dr. Geronimo at the ILD center  Subjective:  Patient ID: Sean [name], male , DOB: September 16, 1950 , age 70 y.o. , MRN: [id number] , ADDRESS: [address] [place] [address] PCP Kip Righter, MD Patient Care Team: Kip Righter, MD as PCP - General (Family Medicine) Shlomo Wilbert SAUNDERS, MD as PCP - Cardiology (Cardiology)  This Provider for this visit: Treatment Team:  Attending Provider: Geronimo Amel, MD    07/28/2022 -   Chief Complaint  Patient presents with   Follow-up    F/up on Myositis, ILD     HPI Sean [name] 73 y.o. -I am meeting him for the first time.  History is gained from talking to him. y.  History is also gained from review of the external records that include cardiology in December 2023 and rheumatology in February 2024 and April 2024.  He tells me that a year ago he started  noticing some difficulty standing up but in October 2023 while walking the dog he became extremely fatigued.  He was admitted to the hospital after that for 6 days.  He received IV steroids.  He says after that he established with Dr. Adine Gift.  He has been on prednisone  since early 2024.  He established with Dr. Lonni Ester and rheumatology.  Has been given a diagnosis of myositis.  He was also started on azathioprine .  Currently is on prednisone  taper.  The plan is to get him to half tablet per day [?  Milligram] within the next 2 months.  He is tolerating these  medications fine.  Has become a little cushingoid.  He states he is much better but still weak.  His current symptom scores are below.  He does have a mild cough but it is is clearing of the throat.  This is chronic.  He is noticed to be on ACE inhibitor's.  He did return from a [place] cruise 2 weeks ago and got sick but he is now recovered.  The cough is gone away from that.     Myositis : Saw Dr Medford Ester rhem 06/03/22  and 2/8/244 -external record reviwd:  Coronary arter calcification:CAD/ Has hx of remote bare metal strength.  SAw NP 01/23/23 [place]er for cardiollogy: This extrenal record reviewed.  Toprol  started. Echo ordered was normal . Advised to continue plavix .  Last stress test 2014 -> . Had PET CT 12/19/3: results reviewed. No evidenc of ischemia  Alcoholosm: He says he was dependent on alcohol  in the past but currently is not but he still drinks 3-4 beers a day.  He has never had a right upper quadrant ultrasound.  He says he is not currently addicted but he does drink daily.  He does not think he have a withdrawal if he quits.  CT Chest data - HRCT 07/14/22 - indepenently visualized, revewied and independently conclude with findings below -> although I personally find the disease burden to be less than 10%.  I did find a prior CT abdomen lung image.  It appears that this ILD is new compared to last year.  I personally contacted Dr. Rea Marc via email and she replied back.  She is going to review the image again.  Narrative & Impression  CLINICAL DATA:  73 year old male with history of myositis. Evaluate for interstitial lung disease.   EXAM: CT CHEST WITHOUT CONTRAST   TECHNIQUE: Multidetector CT imaging of the chest was performed following the standard protocol without intravenous contrast. High resolution imaging of the lungs, as well as inspiratory and expiratory imaging, was performed.   RADIATION DOSE REDUCTION: This exam was performed according to the departmental  dose-optimization program which includes automated exposure control, adjustment of the mA and/or kV according to patient size and/or use of iterative reconstruction technique.   COMPARISON:  No priors.   FINDINGS: Cardiovascular: Heart size is normal. There is no significant pericardial fluid, thickening or pericardial calcification. There is aortic atherosclerosis, as well as atherosclerosis of the great vessels of the mediastinum and the coronary arteries, including calcified atherosclerotic plaque in the left main, left anterior descending, left circumflex and right coronary arteries. Calcifications of the aortic valve.   Mediastinum/Nodes: No pathologically enlarged mediastinal or hilar lymph nodes. Please note that accurate exclusion of hilar adenopathy is limited on noncontrast CT scans. Esophagus is unremarkable in appearance. No axillary lymphadenopathy.   Lungs/Pleura: High-resolution images demonstrate some mild ground-glass attenuation and septal thickening, most  evident in the lung bases bilaterally. Minimal subpleural reticulation. No traction bronchiectasis or honeycombing. Inspiratory and expiratory imaging demonstrates mild air trapping indicative of mild small airways disease. No acute consolidative airspace disease. No pleural effusions.   Upper Abdomen: Aortic atherosclerosis.   Musculoskeletal: There are no aggressive appearing lytic or blastic lesions noted in the visualized portions of the skeleton.   IMPRESSION: 1. The appearance of the lungs suggests early changes of interstitial lung disease, with a spectrum of findings at this time categorized as indeterminate for usual interstitial pneumonia (UIP) per current ATS guidelines. Repeat high-resolution chest CT is suggested in 12 months to assess for temporal changes in the appearance of the lung parenchyma. 2. Aortic atherosclerosis, in addition to left main and three-vessel coronary artery disease.  Assessment for potential risk factor modification, dietary therapy or pharmacologic therapy may be warranted, if clinically indicated. 3. There are calcifications of the aortic valve. Echocardiographic correlation for evaluation of potential valvular dysfunction may be warranted if clinically indicated.   Aortic Atherosclerosis (ICD10-I70.0).     Electronically Signed   By: Toribio Aye M.D.   On: 07/18/2022 07:42        OV 09/29/2022  Subjective:  Patient ID: Sean [name], male , DOB: 12-Nov-1950 , age 65 y.o. , MRN: [id number] , ADDRESS: [address] [place] [address] PCP Kip Righter, MD Patient Care Team: Kip Righter, MD as PCP - General (Family Medicine) Shlomo Wilbert SAUNDERS, MD as PCP - Cardiology (Cardiology)  This Provider for this visit: Treatment Team:  Attending Provider: Geronimo Amel, MD    09/29/2022 -   Chief Complaint  Patient presents with   Follow-up    F/up on PFT     HPI Sean [name] 73 y.o. -myositis with ILD follow-up.  After his last visit I got an email from Dr. Bonnetta, ccording to Dr. Rea Bonnetta thoracic radiologist: In an email; , this would be consistent with early NSIP,  but I do not see definite traction bronchiectasis and could also be mild infect/inflam pneumonitis. Findings are new from Feb 11, 2022.  Involvement is less than 10%.  He tells me now that he continues to do stable.  In fact dyspnea symptom score slightly better.  He says [place].  He is able to climb 3 levels holding the guardrail but without stopping.  He spent a week there.  He felt his effort tolerance was way better than it was in January 2024.  Therefore he feels he is improved.  He is weaning down on prednisone .  He has upcoming appointment Dr. Lonni Ester and he believes he will be weaned off prednisone .  He continues Imuran  at 150 mg daily.  He is tolerating this well.  Review  of the labs indicate that his CK in May 2024 was 500s.  His liver enzymes continue to be high even in June 2024.  I thought I ordered a right upper quadrant ultrasound but I do not see this being done.  Will reorder this today.  He is known to have alcohol  intake in the past.  He did pulmonary function test today and shows a slight decline compared to spring 2024.  During this time his been on steroids and Imuran .  I do not know if this is a true decrease or a variation.  This because his symptoms are stable.  He has a true decline and he needs change of  the Imuran  to CellCept or another immunomodulator and definitely will need to add antifibrotic.  However his abnormal liver function test will preclude adding an antifibrotic.  Will get this rechecked today.  Repeat sit stand hypxoemia test is stable  CT Chest data from date: nMay 2024   According to Dr. Rea Marc thoracic radiologist: In an email; , this would be consistent with early NSIP,  but I do not see definite traction bronchiectasis and could also be mild infect/inflam pneumonitis. Findings are new from Feb 11, 2022.  Involvement is less than 10%.   OV 01/05/2023  Subjective:  Patient ID: Sean [name], male , DOB: [birth year] , age 70 y.o. , MRN: [id number] , ADDRESS: [address] [place] [address] PCP Kip Righter, MD Patient Care Team: Kip Righter, MD as PCP - General (Family Medicine) Shlomo Wilbert SAUNDERS, MD as PCP - Cardiology (Cardiology)  This Provider for this visit: Treatment Team:  Attending Provider: Geronimo Amel, MD    01/05/2023 -   Chief Complaint  Patient presents with   Follow-up    Pft f/u, pt states he has a increase productive cough. ( Clear ) no inhaler usage      HPI Luka Stohr Timmins 73 y.o. -returns for follow-up.  Overall he is stable as seen by the symptom score although his cough is something that he feels it is different or new.  He states there is no associated wheezing but  for the last few months he has had some cough particularly when he lies on his lateral side but is not limited supine position.  Its intermittent it is on and off but in the last 1 week it suddenly improving.  He says it significant enough that his wife goes to another bed.  He believes it is because of the wet fall season.  In the past his blood eosinophils have been high but no allergy test has been done.  But he has no wheezing or other symptoms.  He had pulmonary function test and it is stable.  At this point in time we resolved that we will just follow along.  He did have a CT scan of the chest earlier this year and there is no evidence of lung cancer.  His next CT scan of the chest should be both for ILD and for lung cancer screening would be in June 2025.  Of note he has myositis and is on Imuran  and prednisone .  His CK is down to 200s in August 2024 he did see Dr. Lonni Ester back then.  He is going to see Dr. Lonni Ester in the next few weeks.  We took a shared decision for us  to check his CK and LFT ahead of seeing Dr. Ester.      OV 10/07/2023  Subjective:  Patient ID: Sean [name], male , DOB: [birth year] , age 14 y.o. , MRN: [id number] , ADDRESS: [address] [place] [address] PCP Kip Righter, MD Patient Care Team: Kip Righter, MD as PCP - General (Family Medicine) Shlomo Wilbert SAUNDERS, MD as PCP - Cardiology (Cardiology)  This Provider for this visit: Treatment Team:  Attending Provider: Geronimo Amel, MD  Follow-up very mild interstitial lung disease less than 10% burden in the setting of myositis and elevated CK  Myositis immunosuppressed with Imuran  and prednisone  follows with Dr. Lonni Ester  Alcohol  use previous history; normal CT scan of the abdomen in December 2023  Previous history of greater than 30 pack  smoking  10/07/2023 -   Chief Complaint  Patient presents with   Medical Management of Chronic Issues   Interstitial Lung Disease     PFT done 09/27/23. Breathing is stable and no new respiratory co's.      HPI TADEUSZ STAHL 72 y.o. - followup Interim Health status: No new complaints No new medical problems. No new surgeries. No ER visits. No Urgent care visits. No changes to medications. Per Him Dr Jeannetta is working dowin his immuran. REcent LFT normal. Rcent CK normal. PFT his month stable. Notice no CT chest in some years and was supposed to get CT this visit but do nto see one    SYMPTOM SCALE - ILD 07/28/2022 09/29/2022  01/05/2023 Supportive care 10/07/2023   Current weight      O2 use ra ra ra ra  Shortness of Breath 0 -> 5 scale with 5 being worst (score 6 If unable to do)  0   At rest 0 0 0 0  Simple tasks - showers, clothes change, eating, shaving 0 0 0 0  Household (dishes, doing bed, laundry) 0 0 00 0  Shopping 1 0 0 0  Walking level at own pace 1 1 1 1   Walking up Stairs 2 2 2 1   Total (30-36) Dyspnea Score 4 3 3 2   How bad is your cough? 1 -ACE inhibitor. 1 3 1   How bad is your fatigue 2 2 1  0  How bad is nausea 0 0 0 0  How bad is vomiting?  0 0 0 0  How bad is diarrhea? 2 1 1  00  How bad is anxiety? 1 1 0 0  How bad is depression 1 0 0 0  Any chronic pain - if so where and how bad x x  0    Simple office walk 224 (66+46 x 2) feet Pod A at [facility] x  3 laps goal with forehead probe 07/28/2022  09/29/2022 t 09/29/2022  10/07/2023   O2 used ra Ra - finger Ra forehead probel ra  Number laps completed 15 sit stand 15 sit stand 15 15 sit stand  Comments about pace good good  99% and HR 62  Resting Pulse Ox/HR 98% and 69/min 98% and HR 70 100%a ahd HR 74 99% and HR 75  Final Pulse Ox/HR 96% and 83/min 96% (dropped to 80% x 1 but trace was bad) 99% and HR 86   Desaturated </= 88% no no    Desaturated <= 3% points no no    Got Tachycardic >/= 90/min no no    Symptoms at end of test none Level 2 dyspnea    Miscellaneous comments none        PFT     Latest Ref Rng & Units 09/27/2023    9:28 AM  01/05/2023    2:40 PM 09/29/2022   10:25 AM 06/04/2022   11:25 AM  PFT Results  FVC-Pre L 3.26  P 2.93  2.83  3.00   FVC-Predicted Pre % 74  P 67  64  68   FVC-Post L    2.95   FVC-Predicted Post %    67   Pre FEV1/FVC % % 79  P 80  71  81   Post FEV1/FCV % %    84   FEV1-Pre L 2.58  P 2.33  2.02  2.43   FEV1-Predicted Pre % 81  P 73  62  75   FEV1-Post L  2.47   DLCO uncorrected ml/min/mmHg 20.87  P 21.21  18.62  21.96   DLCO UNC% % 81  P 82  72  85   DLCO corrected ml/min/mmHg 20.87  P 21.21  18.62  22.28   DLCO COR %Predicted % 81  P 82  72  86   DLVA Predicted % 100  P 114  97  123   TLC L    5.25   TLC % Predicted %    74   RV % Predicted %    86     P Preliminary result       LAB RESULTS last 96 hours No results found.       has a past medical history of Alcohol  addiction (HCC), Aortic root dilatation (HCC), Arrhythmia, Coronary artery disease, Depression, ED (erectile dysfunction), HLD (hyperlipidemia), HTN (hypertension), ILD (interstitial lung disease) (HCC), Mental disorder, Myositis, Peptic ulcer, and RBBB (10/24/2015).   reports that he quit smoking about 35 years ago. His smoking use included cigarettes. He started smoking about 65 years ago. He has a 30 pack-year smoking history. He has been exposed to tobacco smoke. He has never used smokeless tobacco.  Past Surgical History:  Procedure Laterality Date   CORONARY STENT PLACEMENT     MUSCLE BIOPSY Left 03/23/2022   Procedure: LEFT THIGH MUSCLE BIOPSY;  Surgeon: Paola Dreama SAILOR, MD;  Location: [facility] OR;  Service: General;  Laterality: Left;    No Known Allergies  Immunization History  Administered Date(s) Administered   Fluad Quad(high Dose 65+) 10/20/2021   PFIZER(Purple Top)SARS-COV-2 Vaccination 04/01/2019   Pneumococcal Conjugate-13 10/05/2016   Pneumococcal Polysaccharide-23 03/11/2018    Family History  Problem Relation Age of Onset   Prostate cancer Father    Heart attack Brother 33    Anesthesia problems Neg Hx    Hypotension Neg Hx    Malignant hyperthermia Neg Hx    Pseudochol deficiency Neg Hx    Colon cancer Neg Hx    Stomach cancer Neg Hx    Esophageal cancer Neg Hx    Colon polyps Neg Hx      Current Outpatient Medications:    amLODipine  (NORVASC ) 2.5 MG tablet, Take 1 tablet (2.5 mg total) by mouth daily., Disp: 90 tablet, Rfl: 1   azaTHIOprine  (IMURAN ) 50 MG tablet, Take 2 tablets (100 mg total) by mouth daily., Disp: 60 tablet, Rfl: 0   clopidogrel  (PLAVIX ) 75 MG tablet, Take 1 tablet (75 mg total) by mouth daily., Disp: 90 tablet, Rfl: 3   furosemide  (LASIX ) 20 MG tablet, Take 1 tablet (20 mg total) by mouth daily., Disp: 90 tablet, Rfl: 0   metoprolol  succinate (TOPROL -XL) 50 MG 24 hr tablet, Take one tablet by mouth every evening with or immediately following a meal., Disp: 90 tablet, Rfl: 1   Multiple Vitamin (MULITIVITAMIN WITH MINERALS) TABS, Take 1 tablet by mouth daily., Disp: , Rfl:    nitroGLYCERIN  (NITROSTAT ) 0.4 MG SL tablet, Place 1 tablet (0.4 mg total) under the tongue every 5 (five) minutes as needed for chest pain., Disp: 25 tablet, Rfl: 3   pantoprazole  (PROTONIX ) 40 MG tablet, Take 1 tablet (40 mg total) by mouth daily., Disp: 30 tablet, Rfl: 0   ezetimibe  (ZETIA ) 10 MG tablet, Take 1 tablet (10 mg total) by mouth daily., Disp: 90 tablet, Rfl: 3      Objective:   Vitals:   10/07/23 1259  BP: 112/70  Pulse: 67  SpO2: 100%  Weight: 210 lb (95.3  kg)  Height: 5' 10 (1.778 m)    Estimated body mass index is 30.13 kg/m as calculated from the following:   Height as of this encounter: 5' 10 (1.778 m).   Weight as of this encounter: 210 lb (95.3 kg).  @WEIGHTCHANGE @  [employer]   10/07/23 1259  Weight: 210 lb (95.3 kg)     Physical Exam   General: No distress. Looks well O2 at rest: no Cane present: no Sitting in wheel chair: no Frail: no Obese: no Neuro: Alert and Oriented x 3. GCS 15. Speech normal Psych:  Pleasant Resp:  Barrel Chest - no.  Wheeze - no, Crackles - ? R Bas, No overt respiratory distress CVS: Normal heart sounds. Murmurs - no Ext: Stigmata of Connective Tissue Disease - no HEENT: Normal upper airway. PEERL +. No post nasal drip        Assessment/     Assessment & Plan ILD (interstitial lung disease) (HCC)  Interstitial lung disease due to connective tissue disease (HCC)  High risk medication use    PLAN Patient Instructions     ICD-10-CM   1. Interstitial lung disease due to connective tissue disease (HCC)  J84.89    M35.9     2. Raynaud's phenomenon without gangrene  I73.00     3. Myositis of lower extremity, unspecified laterality, unspecified myositis type  M60.9     4. High risk medication use  Z79.899     5. Cough, unspecified type  R05.9         Interstitial lung disease due to connective tissue disease (HCC) DOE (dyspnea on exertion) Myositis of lower extremity, unspecified laterality, unspecified myositis type Raynaud's phenomenon without gangrene High Risk Medication use    - ILD is clinically stable based on symptoms, , and exercise test and PFT - Overall burden in CT chest June 2024 burden of ILD was mild and < 10% - CK improved to 200s in August 2024 and normal 100s in May 2025   Plan -  Hold off anti-fibrotic for now - I will recommend antifibrotic's if the pulmonary function test at follow-up is not improving or is getting worse or if the severity of lung disease burden is greater than 10-20% provided liver is normal - get HRCT in 6 months   History of smoking 30 or more pack years.Quit > 30 years ago  - no evidence of lung cancer June 2024  Plan  - HRCT in 6 months     Coronary artery calcification seen on CAT scan Aortic valve calcification  - last echo dec 2023 - normal  - last Stress test 2014 and dec 2023 without ischemia  Plan  - per cardiology    Alcohol  use High LFT history  - norma CT abd dec  2023 - normal LFT Aug 2024 and May 2025  Plan -per PCP Kip Righter, MD    Followup  - 6 months 15 min visit after HRCT    FOLLOWUP    Return in about 6 months (around 04/08/2024) for 15 min visit, with any of the APPS, ILD.    SIGNATURE    Dr. Dorethia Cave, M.D., F.C.C.P,  Pulmonary and Critical Care Medicine Staff Physician, [facility] Center Director - Interstitial Lung Disease  Program  Pulmonary Fibrosis [facility] at [facility] [place], [place], [address]  Pager: [phone], If no answer or between  15:00h - 7:00h: call [phone] Telephone: [phone]  1:26 PM 10/07/2023

## 2023-10-07 NOTE — Patient Instructions (Addendum)
 ICD-10-CM   1. Interstitial lung disease due to connective tissue disease (HCC)  J84.89    M35.9     2. Raynaud's phenomenon without gangrene  I73.00     3. Myositis of lower extremity, unspecified laterality, unspecified myositis type  M60.9     4. High risk medication use  Z79.899     5. Cough, unspecified type  R05.9         Interstitial lung disease due to connective tissue disease (HCC) DOE (dyspnea on exertion) Myositis of lower extremity, unspecified laterality, unspecified myositis type Raynaud's phenomenon without gangrene High Risk Medication use    - ILD is clinically stable based on symptoms, , and exercise test and PFT - Overall burden in CT chest June 2024 burden of ILD was mild and < 10% - CK improved to 200s in August 2024 and normal 100s in May 2025   Plan -  Hold off anti-fibrotic for now - I will recommend antifibrotic's if the pulmonary function test at follow-up is not improving or is getting worse or if the severity of lung disease burden is greater than 10-20% provided liver is normal - get HRCT in 6 months   History of smoking 30 or more pack years.Quit > 30 years ago  - no evidence of lung cancer June 2024  Plan  - HRCT in 6 months     Coronary artery calcification seen on CAT scan Aortic valve calcification  - last echo dec 2023 - normal  - last Stress test 2014 and dec 2023 without ischemia  Plan  - per cardiology    Alcohol  use High LFT history  - norma CT abd dec 2023 - normal LFT Aug 2024 and May 2025  Plan -per PCP Kip Righter, MD    Followup  - 6 months 15 min visit after HRCT

## 2023-10-11 ENCOUNTER — Ambulatory Visit: Attending: Internal Medicine | Admitting: Internal Medicine

## 2023-10-11 ENCOUNTER — Encounter: Payer: Self-pay | Admitting: Internal Medicine

## 2023-10-11 VITALS — BP 106/57 | HR 65 | Resp 16 | Ht 70.0 in | Wt 208.0 lb

## 2023-10-11 DIAGNOSIS — J849 Interstitial pulmonary disease, unspecified: Secondary | ICD-10-CM

## 2023-10-11 DIAGNOSIS — M6089 Other myositis, multiple sites: Secondary | ICD-10-CM | POA: Diagnosis not present

## 2023-10-11 DIAGNOSIS — E039 Hypothyroidism, unspecified: Secondary | ICD-10-CM

## 2023-10-11 DIAGNOSIS — R600 Localized edema: Secondary | ICD-10-CM | POA: Diagnosis not present

## 2023-10-11 DIAGNOSIS — Z79899 Other long term (current) drug therapy: Secondary | ICD-10-CM | POA: Diagnosis not present

## 2023-10-11 LAB — CBC WITH DIFFERENTIAL/PLATELET
Absolute Lymphocytes: 1290 {cells}/uL (ref 850–3900)
Absolute Monocytes: 972 {cells}/uL — ABNORMAL HIGH (ref 200–950)
Basophils Absolute: 18 {cells}/uL (ref 0–200)
Basophils Relative: 0.3 %
Eosinophils Absolute: 90 {cells}/uL (ref 15–500)
Eosinophils Relative: 1.5 %
HCT: 45.8 % (ref 38.5–50.0)
Hemoglobin: 15.1 g/dL (ref 13.2–17.1)
MCH: 33 pg (ref 27.0–33.0)
MCHC: 33 g/dL (ref 32.0–36.0)
MCV: 100 fL (ref 80.0–100.0)
MPV: 10.3 fL (ref 7.5–12.5)
Monocytes Relative: 16.2 %
Neutro Abs: 3630 {cells}/uL (ref 1500–7800)
Neutrophils Relative %: 60.5 %
Platelets: 232 Thousand/uL (ref 140–400)
RBC: 4.58 Million/uL (ref 4.20–5.80)
RDW: 13.1 % (ref 11.0–15.0)
Total Lymphocyte: 21.5 %
WBC: 6 Thousand/uL (ref 3.8–10.8)

## 2023-10-11 LAB — COMPREHENSIVE METABOLIC PANEL WITH GFR
AG Ratio: 1.6 (calc) (ref 1.0–2.5)
ALT: 20 U/L (ref 9–46)
AST: 22 U/L (ref 10–35)
Albumin: 4.1 g/dL (ref 3.6–5.1)
Alkaline phosphatase (APISO): 80 U/L (ref 35–144)
BUN: 22 mg/dL (ref 7–25)
CO2: 33 mmol/L — ABNORMAL HIGH (ref 20–32)
Calcium: 9.2 mg/dL (ref 8.6–10.3)
Chloride: 101 mmol/L (ref 98–110)
Creat: 0.93 mg/dL (ref 0.70–1.28)
Globulin: 2.6 g/dL (ref 1.9–3.7)
Glucose, Bld: 89 mg/dL (ref 65–99)
Potassium: 4.2 mmol/L (ref 3.5–5.3)
Sodium: 139 mmol/L (ref 135–146)
Total Bilirubin: 0.6 mg/dL (ref 0.2–1.2)
Total Protein: 6.7 g/dL (ref 6.1–8.1)
eGFR: 87 mL/min/1.73m2 (ref >=60)

## 2023-10-11 LAB — CK: Total CK: 97 U/L (ref 19–278)

## 2023-10-11 MED ORDER — AZATHIOPRINE 50 MG PO TABS: 50.0000 mg | ORAL_TABLET | Freq: Every day | ORAL | 0 refills | Status: DC

## 2023-10-29 DIAGNOSIS — E291 Testicular hypofunction: Secondary | ICD-10-CM | POA: Diagnosis not present

## 2023-12-06 ENCOUNTER — Encounter: Payer: Self-pay | Admitting: Internal Medicine

## 2023-12-06 ENCOUNTER — Ambulatory Visit: Admitting: Internal Medicine

## 2023-12-06 VITALS — BP 124/68 | HR 64 | Temp 97.9°F | Ht 70.0 in | Wt 215.2 lb

## 2023-12-06 DIAGNOSIS — M609 Myositis, unspecified: Secondary | ICD-10-CM | POA: Diagnosis not present

## 2023-12-06 DIAGNOSIS — R058 Other specified cough: Secondary | ICD-10-CM

## 2023-12-06 DIAGNOSIS — R0609 Other forms of dyspnea: Secondary | ICD-10-CM

## 2023-12-06 DIAGNOSIS — Z87891 Personal history of nicotine dependence: Secondary | ICD-10-CM

## 2023-12-06 DIAGNOSIS — R0982 Postnasal drip: Secondary | ICD-10-CM | POA: Diagnosis not present

## 2023-12-06 DIAGNOSIS — I7300 Raynaud's syndrome without gangrene: Secondary | ICD-10-CM | POA: Diagnosis not present

## 2023-12-06 DIAGNOSIS — J849 Interstitial pulmonary disease, unspecified: Secondary | ICD-10-CM

## 2023-12-06 LAB — CBC WITH DIFFERENTIAL/PLATELET
Basophils Absolute: 0 K/uL (ref 0.0–0.1)
Basophils Relative: 0.6 % (ref 0.0–3.0)
Eosinophils Absolute: 0.1 K/uL (ref 0.0–0.7)
Eosinophils Relative: 1.5 % (ref 0.0–5.0)
HCT: 42.3 % (ref 39.0–52.0)
Hemoglobin: 14.2 g/dL (ref 13.0–17.0)
Lymphocytes Relative: 20.5 % (ref 12.0–46.0)
Lymphs Abs: 1.1 K/uL (ref 0.7–4.0)
MCHC: 33.6 g/dL (ref 30.0–36.0)
MCV: 99.1 fl (ref 78.0–100.0)
Monocytes Absolute: 0.6 K/uL (ref 0.1–1.0)
Monocytes Relative: 11 % (ref 3.0–12.0)
Neutro Abs: 3.5 K/uL (ref 1.4–7.7)
Neutrophils Relative %: 66.4 % (ref 43.0–77.0)
Platelets: 203 K/uL (ref 150.0–400.0)
RBC: 4.26 Mil/uL (ref 4.22–5.81)
RDW: 13.5 % (ref 11.5–15.5)
WBC: 5.3 K/uL (ref 4.0–10.5)

## 2023-12-06 MED ORDER — AZELASTINE HCL 0.1 % NA SOLN: 2.0000 | Freq: Two times a day (BID) | NASAL | 12 refills | Status: DC

## 2023-12-06 NOTE — Progress Notes (Signed)
 IOV 03/11/22 - Patient of Dr Brenna   This is a 73 year old gentleman, history of alcohol  abuse, coronary artery disease, depression, hypertension, hyperlipidemia.  Patient is a former smoker quit in 19 90, 34-pack-year history. He trys to walk a lot. He has dogs that he walks regularly. Over the past 4 months he has had increased sob and fatigue. Distances and inclines make walking worse. He saw cardiology and they did several studies and everything seemed fine. He was sent to GI for evaluation for elevated LFTs, still drinking alcohol  daily.  He feels like he is becoming weaker and weaker over the past couple of months.  He is finding that he is having pains in his muscles and when he describes this he points to locations in large muscle groups and side of the thigh as well as shoulders.  He is also noticed ongoing weight loss.  He also has recurrent episodes of Raynaud's of the hands.  OV 03/18/2022: Here today for evaluation of recent abnormal labs.  His CK is elevated, aldolase elevated, ANA titer was positive.  For some reason his myositis antibody panel was canceled.  04/08/2022 Patient was seen last month for shortness of breath/dyspnea symptoms. He was dx with inflammatory myositis recently. Currently on prednisone  60mg  and following with rheumatology.   No issues with shortness of breath. Congestion is a lot better since being discharged from the hospital. His main complaint is muscle fatigue. He first noticed weakness last spring. He has no muscle pain except for chronic lower back pain. He has seen some improvement in his functioning since starting steroids. He is able to do a little bit more than he could last week. He is getting physical therapy. After therapy he was tired for the rest of the day.   He also has generalized fluid retention that started before he began taking high dose steroids, he increased lasix  40mg  for a couple of days. He has cut back on alcohol  but still having 2-3  drinks every couple of days. Following with Dr. Tobie with neurology , planning for EMG and NCS.   OV 06/15/2022: Doing well today.  He feels so much better.  He is stronger.  Tolerating his Imuran  plus prednisone .  He had pulmonary function test completed which reviewed today in the office which shows a reduced FVC.  His DLCO however is normal.  He does have some crackles on exam has not had any axial CT imaging.  Still feels dyspneic when he exerts himself.  OV 07/28/2022 -transfer of care from Dr. Adine Icard to Dr. Geronimo at the ILD center  Subjective:  Patient ID: Sean [name], male , DOB: April 04, 1950 , age 49 y.o. , MRN: [id number] , ADDRESS: [address] [place] [address] PCP Kip Righter, MD Patient Care Team: Kip Righter, MD as PCP - General (Family Medicine) Shlomo Wilbert SAUNDERS, MD as PCP - Cardiology (Cardiology)  This Provider for this visit: Treatment Team:  Attending Provider: Geronimo Amel, MD    07/28/2022 -   Chief Complaint  Patient presents with   Follow-up    F/up on Myositis, ILD     HPI Sean [name] 73 y.o. -I am meeting him for the first time.  History is gained from talking to him. y.  History is also gained from review of the external records that include cardiology in December 2023 and rheumatology in February 2024 and April 2024.  He tells me that a year ago he  started noticing some difficulty standing up but in October 2023 while walking the dog he became extremely fatigued.  He was admitted to the hospital after that for 6 days.  He received IV steroids.  He says after that he established with Dr. Adine Gift.  He has been on prednisone  since early 2024.  He established with Dr. Lonni Ester and rheumatology.  Has been given a diagnosis of myositis.  He was also started on azathioprine .  Currently is on prednisone  taper.  The plan is to get him to half tablet per day [?  Milligram] within the next 2 months.  He is tolerating these  medications fine.  Has become a little cushingoid.  He states he is much better but still weak.  His current symptom scores are below.  He does have a mild cough but it is is clearing of the throat.  This is chronic.  He is noticed to be on ACE inhibitor's.  He did return from a [place] cruise 2 weeks ago and got sick but he is now recovered.  The cough is gone away from that.     Myositis : Saw Dr Medford Ester rhem 06/03/22  and 2/8/244 -external record reviwd:  Coronary arter calcification:CAD/ Has hx of remote bare metal strength.  SAw NP 01/23/23 [place]er for cardiollogy: This extrenal record reviewed.  Toprol  started. Echo ordered was normal . Advised to continue plavix .  Last stress test 2014 -> . Had PET CT 12/19/3: results reviewed. No evidenc of ischemia  Alcoholosm: He says he was dependent on alcohol  in the past but currently is not but he still drinks 3-4 beers a day.  He has never had a right upper quadrant ultrasound.  He says he is not currently addicted but he does drink daily.  He does not think he have a withdrawal if he quits.  CT Chest data - HRCT 07/14/22 - indepenently visualized, revewied and independently conclude with findings below -> although I personally find the disease burden to be less than 10%.  I did find a prior CT abdomen lung image.  It appears that this ILD is new compared to last year.  I personally contacted Dr. Rea Marc via email and she replied back.  She is going to review the image again.  Narrative & Impression  CLINICAL DATA:  73 year old male with history of myositis. Evaluate for interstitial lung disease.   EXAM: CT CHEST WITHOUT CONTRAST   TECHNIQUE: Multidetector CT imaging of the chest was performed following the standard protocol without intravenous contrast. High resolution imaging of the lungs, as well as inspiratory and expiratory imaging, was performed.   RADIATION DOSE REDUCTION: This exam was performed according to the departmental  dose-optimization program which includes automated exposure control, adjustment of the mA and/or kV according to patient size and/or use of iterative reconstruction technique.   COMPARISON:  No priors.   FINDINGS: Cardiovascular: Heart size is normal. There is no significant pericardial fluid, thickening or pericardial calcification. There is aortic atherosclerosis, as well as atherosclerosis of the great vessels of the mediastinum and the coronary arteries, including calcified atherosclerotic plaque in the left main, left anterior descending, left circumflex and right coronary arteries. Calcifications of the aortic valve.   Mediastinum/Nodes: No pathologically enlarged mediastinal or hilar lymph nodes. Please note that accurate exclusion of hilar adenopathy is limited on noncontrast CT scans. Esophagus is unremarkable in appearance. No axillary lymphadenopathy.   Lungs/Pleura: High-resolution images demonstrate some mild ground-glass attenuation and septal thickening,  most evident in the lung bases bilaterally. Minimal subpleural reticulation. No traction bronchiectasis or honeycombing. Inspiratory and expiratory imaging demonstrates mild air trapping indicative of mild small airways disease. No acute consolidative airspace disease. No pleural effusions.   Upper Abdomen: Aortic atherosclerosis.   Musculoskeletal: There are no aggressive appearing lytic or blastic lesions noted in the visualized portions of the skeleton.   IMPRESSION: 1. The appearance of the lungs suggests early changes of interstitial lung disease, with a spectrum of findings at this time categorized as indeterminate for usual interstitial pneumonia (UIP) per current ATS guidelines. Repeat high-resolution chest CT is suggested in 12 months to assess for temporal changes in the appearance of the lung parenchyma. 2. Aortic atherosclerosis, in addition to left main and three-vessel coronary artery disease.  Assessment for potential risk factor modification, dietary therapy or pharmacologic therapy may be warranted, if clinically indicated. 3. There are calcifications of the aortic valve. Echocardiographic correlation for evaluation of potential valvular dysfunction may be warranted if clinically indicated.   Aortic Atherosclerosis (ICD10-I70.0).     Electronically Signed   By: Toribio Aye M.D.   On: 07/18/2022 07:42        OV 09/29/2022  Subjective:  Patient ID: Sean [name], male , DOB: [birth year] , age 82 y.o. , MRN: [id number] , ADDRESS: [address] [place] [address] PCP Kip Righter, MD Patient Care Team: Kip Righter, MD as PCP - General (Family Medicine) Shlomo Wilbert SAUNDERS, MD as PCP - Cardiology (Cardiology)  This Provider for this visit: Treatment Team:  Attending Provider: Geronimo Amel, MD    09/29/2022 -   Chief Complaint  Patient presents with   Follow-up    F/up on PFT     HPI Sean [name] 73 y.o. -myositis with ILD follow-up.  After his last visit I got an email from Dr. Bonnetta, ccording to Dr. Rea Bonnetta thoracic radiologist: In an email; , this would be consistent with early NSIP,  but I do not see definite traction bronchiectasis and could also be mild infect/inflam pneumonitis. Findings are new from Feb 11, 2022.  Involvement is less than 10%.  He tells me now that he continues to do stable.  In fact dyspnea symptom score slightly better.  He says [place].  He is able to climb 3 levels holding the guardrail but without stopping.  He spent a week there.  He felt his effort tolerance was way better than it was in January 2024.  Therefore he feels he is improved.  He is weaning down on prednisone .  He has upcoming appointment Dr. Lonni Ester and he believes he will be weaned off prednisone .  He continues Imuran  at 150 mg daily.  He is tolerating this well.  Review  of the labs indicate that his CK in May 2024 was 500s.  His liver enzymes continue to be high even in June 2024.  I thought I ordered a right upper quadrant ultrasound but I do not see this being done.  Will reorder this today.  He is known to have alcohol  intake in the past.  He did pulmonary function test today and shows a slight decline compared to spring 2024.  During this time his been on steroids and Imuran .  I do not know if this is a true decrease or a variation.  This because his symptoms are stable.  He has a true decline and he needs change  of the Imuran  to CellCept or another immunomodulator and definitely will need to add antifibrotic.  However his abnormal liver function test will preclude adding an antifibrotic.  Will get this rechecked today.  Repeat sit stand hypxoemia test is stable  CT Chest data from date: nMay 2024   According to Dr. Rea Marc thoracic radiologist: In an email; , this would be consistent with early NSIP,  but I do not see definite traction bronchiectasis and could also be mild infect/inflam pneumonitis. Findings are new from Feb 11, 2022.  Involvement is less than 10%.   OV 01/05/2023  Subjective:  Patient ID: Sean [name], male , DOB: [birth year] , age 79 y.o. , MRN: [id number] , ADDRESS: [address] [place] [address] PCP Kip Righter, MD Patient Care Team: Kip Righter, MD as PCP - General (Family Medicine) Shlomo Wilbert SAUNDERS, MD as PCP - Cardiology (Cardiology)  This Provider for this visit: Treatment Team:  Attending Provider: Geronimo Amel, MD    01/05/2023 -   Chief Complaint  Patient presents with   Follow-up    Pft f/u, pt states he has a increase productive cough. ( Clear ) no inhaler usage      HPI Sean [name] 73 y.o. -returns for follow-up.  Overall he is stable as seen by the symptom score although his cough is something that he feels it is different or new.  He states there is no associated wheezing but  for the last few months he has had some cough particularly when he lies on his lateral side but is not limited supine position.  Its intermittent it is on and off but in the last 1 week it suddenly improving.  He says it significant enough that his wife goes to another bed.  He believes it is because of the wet fall season.  In the past his blood eosinophils have been high but no allergy test has been done.  But he has no wheezing or other symptoms.  He had pulmonary function test and it is stable.  At this point in time we resolved that we will just follow along.  He did have a CT scan of the chest earlier this year and there is no evidence of lung cancer.  His next CT scan of the chest should be both for ILD and for lung cancer screening would be in June 2025.  Of note he has myositis and is on Imuran  and prednisone .  His CK is down to 200s in August 2024 he did see Dr. Lonni Ester back then.  He is going to see Dr. Lonni Ester in the next few weeks.  We took a shared decision for us  to check his CK and LFT ahead of seeing Dr. Ester.      OV 10/07/2023  Subjective:  Patient ID: Sean [name], male , DOB: May 19, 1950 , age 55 y.o. , MRN: [id number] , ADDRESS: [address] [place] [address] PCP Kip Righter, MD Patient Care Team: Kip Righter, MD as PCP - General (Family Medicine) Shlomo Wilbert SAUNDERS, MD as PCP - Cardiology (Cardiology)  This Provider for this visit: Treatment Team:  Attending Provider: Geronimo Amel, MD  10/07/2023 -   Chief Complaint  Patient presents with   Medical Management of Chronic Issues   Interstitial Lung Disease    PFT done 09/27/23. Breathing is stable and no new respiratory co's.      HPI Sean [name] 73 y.o. - followup [facility]  status: No new complaints No new medical problems. No new surgeries. No ER visits. No Urgent care visits. No changes to medications. Per Him Dr Jeannetta is working dowin his immuran. REcent LFT normal. Rcent  CK normal. PFT his month stable. Notice no CT chest in some years and was supposed to get CT this visit but do nto see one    OV 12/06/2023  Subjective:  Patient ID: Sean [name], male , DOB: 1950-10-03 , age 47 y.o. , MRN: [id number] , ADDRESS: [address] [place] [address] PCP Kip Righter, MD Patient Care Team: Kip Righter, MD as PCP - General (Family Medicine) Shlomo Wilbert SAUNDERS, MD as PCP - Cardiology (Cardiology)  This Provider for this visit: Treatment Team:  Attending Provider: Geronimo Amel, MD    Follow-up very mild interstitial lung disease less than 10% burden in the setting of myositis and elevated CK  -Last CT May 2024  - Lst PFT Aug 2025 Myositis immunosuppressed with Imuran  and prednisone  follows with Dr. Lonni Jeannetta  - CK normal Aug 2025   Previous history of greater than 30 pack smoking    Coronary artery calcification seen on CAT scan Aortic valve calcification  - last echo dec 2023 - normal  - last Stress test 2014 and dec 2023 without ischemia  Plan  - per cardiology    Alcohol  use High LFT history  - norma CT abd dec 2023 - normal LFT Aug 2024 and May 2025  Plan -per PCP Kip Righter, MD   12/06/2023 -   Chief Complaint  Patient presents with   Interstitial Lung Disease    Pt states since LOV breathing has been getting worse due to congestion and being woke up at 3am to cough phlegm up ( phlegm unknown) Prod cough Hasn't been taking anything congestion      HPI Sean [name] 73 y.o. -acute visit. Says last few months afte the goes to bed he wakes up coughing. Is severe. Wife now sleeping in another room. When he is supine, he Is unable to sleep and wait a long time but it does help cough. IN lateral position, he is able to sleep easier but makes cough worse. Progressive. No wheezing. No day time cough. Has pain with coughin in Rt inflra clavicular area. Sinuss feels dry.   Symptoms score shows orsening  cough. Not on ACE inhibitor Exercuse hypoxemia test is fine Remains on immuran      SYMPTOM SCALE - ILD 07/28/2022 09/29/2022  01/05/2023 Supportive care 10/07/2023  12/06/2023 Acute visit cough  Current weight       O2 use ra ra ra ra   Shortness of Breath 0 -> 5 scale with 5 being worst (score 6 If unable to do)  0    At rest 0 0 0 0 0  Simple tasks - showers, clothes change, eating, shaving 0 0 0 0 0  Household (dishes, doing bed, laundry) 0 0 00 0 0  Shopping 1 0 0 0 0  Walking level at own pace 1 1 1 1 1   Walking up Stairs 2 2 2 1 2   Total (30-36) Dyspnea Score 4 3 3 2 3   How bad is your cough? 1 -ACE inhibitor. 1 3 1 4   How bad is your fatigue 2 2 1  0 1  How bad is nausea 0 0 0 0 0  How bad is vomiting?  0 0 0 0 0  How bad is diarrhea? 2 1 1  00 1  How bad is anxiety? 1 1 0 0 0  How bad is depression 1 0 0 0 0  Any chronic pain - if so where and how bad x x  0 0      SIT STAND TEST - goal 15 times   12/06/2023    O2 used ra   PRobe - finter or forehead finge   Number sit and stand completed - goal 15 15   Time taken to complete 49 sec   Resting Pulse Ox/HR/Dyspnea  99% and 72/min and dyspnea of 1/10    Peak measures 99 % and 101/min and dyspnea of 1/10   Final Pulse Ox/HR 97% and 77/min and dyspnea of 2/10   Desaturated </= 88% no   Desaturated <= 3% points no   Got Tachycardic >/= 90/min yes   Miscellaneous comments x      PFT     Latest Ref Rng & Units 09/27/2023    9:28 AM 01/05/2023    2:40 PM 09/29/2022   10:25 AM 06/04/2022   11:25 AM  PFT Results  FVC-Pre L 3.26  2.93  2.83  3.00   FVC-Predicted Pre % 74  67  64  68   FVC-Post L    2.95   FVC-Predicted Post %    67   Pre FEV1/FVC % % 79  80  71  81   Post FEV1/FCV % %    84   FEV1-Pre L 2.58  2.33  2.02  2.43   FEV1-Predicted Pre % 81  73  62  75   FEV1-Post L    2.47   DLCO uncorrected ml/min/mmHg 20.87  21.21  18.62  21.96   DLCO UNC% % 81  82  72  85   DLCO corrected ml/min/mmHg 20.87   21.21  18.62  22.28   DLCO COR %Predicted % 81  82  72  86   DLVA Predicted % 100  114  97  123   TLC L    5.25   TLC % Predicted %    74   RV % Predicted %    86        LAB RESULTS last 96 hours No results found.       has a past medical history of Alcohol  addiction (HCC), Aortic root dilatation, Arrhythmia, Coronary artery disease, Depression, ED (erectile dysfunction), HLD (hyperlipidemia), HTN (hypertension), ILD (interstitial lung disease) (HCC), Mental disorder, Myositis, Peptic ulcer, and RBBB (10/24/2015).   reports that he quit smoking about 35 years ago. His smoking use included cigarettes. He started smoking about 65 years ago. He has a 30 pack-year smoking history. He has been exposed to tobacco smoke. He has never used smokeless tobacco.  Past Surgical History:  Procedure Laterality Date   CORONARY STENT PLACEMENT     MUSCLE BIOPSY Left 03/23/2022   Procedure: LEFT THIGH MUSCLE BIOPSY;  Surgeon: Paola Dreama SAILOR, MD;  Location: [facility] OR;  Service: General;  Laterality: Left;    No Known Allergies  Immunization History  Administered Date(s) Administered   Fluad Quad(high Dose 65+) 10/20/2021   INFLUENZA, HIGH DOSE SEASONAL PF 11/06/2023   PFIZER(Purple Top)SARS-COV-2 Vaccination 04/01/2019   Pneumococcal Conjugate-13 10/05/2016   Pneumococcal Polysaccharide-23 03/11/2018    Family History  Problem Relation Age of Onset   Prostate cancer Father    Heart attack Brother 67   Anesthesia problems Neg Hx    Hypotension Neg Hx    Malignant  hyperthermia Neg Hx    Pseudochol deficiency Neg Hx    Colon cancer Neg Hx    Stomach cancer Neg Hx    Esophageal cancer Neg Hx    Colon polyps Neg Hx      Current Outpatient Medications:    amLODipine  (NORVASC ) 2.5 MG tablet, Take 1 tablet (2.5 mg total) by mouth daily., Disp: 90 tablet, Rfl: 1   azaTHIOprine  (IMURAN ) 50 MG tablet, Take 1 tablet (50 mg total) by mouth daily., Disp: 90 tablet, Rfl: 0   azelastine (ASTELIN)  0.1 % nasal spray, Place 2 sprays into both nostrils 2 (two) times daily. Use in each nostril as directed, Disp: 30 mL, Rfl: 12   clopidogrel  (PLAVIX ) 75 MG tablet, Take 1 tablet (75 mg total) by mouth daily., Disp: 90 tablet, Rfl: 3   ezetimibe  (ZETIA ) 10 MG tablet, Take 1 tablet (10 mg total) by mouth daily., Disp: 90 tablet, Rfl: 3   furosemide  (LASIX ) 20 MG tablet, Take 1 tablet (20 mg total) by mouth daily., Disp: 90 tablet, Rfl: 0   metoprolol  succinate (TOPROL -XL) 50 MG 24 hr tablet, Take one tablet by mouth every evening with or immediately following a meal., Disp: 90 tablet, Rfl: 1   Multiple Vitamin (MULITIVITAMIN WITH MINERALS) TABS, Take 1 tablet by mouth daily., Disp: , Rfl:    nitroGLYCERIN  (NITROSTAT ) 0.4 MG SL tablet, Place 1 tablet (0.4 mg total) under the tongue every 5 (five) minutes as needed for chest pain. (Patient not taking: Reported on 12/06/2023), Disp: 25 tablet, Rfl: 3   pantoprazole  (PROTONIX ) 40 MG tablet, Take 1 tablet (40 mg total) by mouth daily. (Patient not taking: Reported on 12/06/2023), Disp: 30 tablet, Rfl: 0      Objective:   Vitals:   12/06/23 0953  BP: 124/68  Pulse: 64  Temp: 97.9 F (36.6 C)  TempSrc: Oral  SpO2: 99%  Weight: 215 lb 3.2 oz (97.6 kg)  Height: 5' 10 (1.778 m)    Estimated body mass index is 30.88 kg/m as calculated from the following:   Height as of this encounter: 5' 10 (1.778 m).   Weight as of this encounter: 215 lb 3.2 oz (97.6 kg).  @WEIGHTCHANGE @  Filed Weights   12/06/23 0953  Weight: 215 lb 3.2 oz (97.6 kg)     Physical Exam   General: No distress. Looks well O2 at rest: no Cane present: no Sitting in wheel chair: no Frail: no Obese: no Neuro: Alert and Oriented x 3. GCS 15. Speech normal Psych: Pleasant Resp:  Barrel Chest - no.  Wheeze - no, Crackles - no, No overt respiratory distress CVS: Normal heart sounds. Murmurs - no Ext: Stigmata of Connective Tissue Disease - no HEENT: Normal upper  airway. PEERL +. No post nasal drip        Assessment/     Assessment & Plan Nocturnal cough  ILD (interstitial lung disease) (HCC)  Post-nasal drip    PLAN Patient Instructions     ICD-10-CM   1. Nocturnal cough  R05.8     2. ILD (interstitial lung disease) (HCC)  J84.9     3. Post-nasal drip  R09.82        Nocturnal cough Postnasal drip  Plan - Do blood work CBC with differential and RAST allergy panel - Do high-resolution CT chest - Do CT sinus without contrast -Start Astelin nasal spray 2 squirts twice daily   Interstitial lung disease due to connective tissue disease (HCC) DOE (dyspnea on exertion)  Myositis of lower extremity, unspecified laterality, unspecified myositis type Raynaud's phenomenon without gangrene High Risk Medication use    - Overall burden in CT chest June 2024 burden of ILD was mild and < 10% - CK improved to 200s in August 2024 and normal 100s in May 2025 - However with the advent of cough we need to restage her interstitial lung disease   Plan -  Hold off anti-fibrotic for now - Approved high-resolution CT chest supine and prone in the next 4 weeks - Do spirometry and DLCO in the next 4 weeks - 8 weeks  History of smoking 30 or more pack years.Quit > 30 years ago  - no evidence of lung cancer June 2024  Plan  - capture updated information in HRCT next few weeks      Followup  - 4-8 weeks with nurse practitioner to review results    FOLLOWUP    Return for  - 4-8 weeks with nurse practitioner to review results.    SIGNATURE    Dr. Dorethia Cave, M.D., F.C.C.P,  Pulmonary and Critical Care Medicine Staff Physician, [facility] Director - Interstitial Lung Disease  Program  Pulmonary Fibrosis [facility] at [facility] [place], [place], [address]  Pager: [phone], If no answer or between  15:00h - 7:00h: call [phone] Telephone: [phone]  10:18  AM 12/06/2023

## 2023-12-06 NOTE — Patient Instructions (Addendum)
 ICD-10-CM   1. Nocturnal cough  R05.8     2. ILD (interstitial lung disease) (HCC)  J84.9     3. Post-nasal drip  R09.82        Nocturnal cough Postnasal drip  Plan - Do blood work CBC with differential and RAST allergy panel - Do high-resolution CT chest - Do CT sinus without contrast -Start Astelin nasal spray 2 squirts twice daily   Interstitial lung disease due to connective tissue disease (HCC) DOE (dyspnea on exertion) Myositis of lower extremity, unspecified laterality, unspecified myositis type Raynaud's phenomenon without gangrene High Risk Medication use    - Overall burden in CT chest June 2024 burden of ILD was mild and < 10% - CK improved to 200s in August 2024 and normal 100s in May 2025 - However with the advent of cough we need to restage her interstitial lung disease   Plan -  Hold off anti-fibrotic for now - Approved high-resolution CT chest supine and prone in the next 4 weeks - Do spirometry and DLCO in the next 4 weeks - 8 weeks  History of smoking 30 or more pack years.Quit > 30 years ago  - no evidence of lung cancer June 2024  Plan  - capture updated information in HRCT next few weeks      Followup  - 4-8 weeks with nurse practitioner to review results

## 2023-12-09 ENCOUNTER — Ambulatory Visit: Payer: Self-pay | Admitting: Internal Medicine

## 2023-12-09 DIAGNOSIS — J329 Chronic sinusitis, unspecified: Secondary | ICD-10-CM

## 2023-12-09 LAB — ALLERGEN PROFILE, PERENNIAL ALLERGEN IGE

## 2023-12-17 DIAGNOSIS — H2513 Age-related nuclear cataract, bilateral: Secondary | ICD-10-CM | POA: Diagnosis not present

## 2023-12-17 DIAGNOSIS — H43812 Vitreous degeneration, left eye: Secondary | ICD-10-CM | POA: Diagnosis not present

## 2023-12-17 DIAGNOSIS — H353132 Nonexudative age-related macular degeneration, bilateral, intermediate dry stage: Secondary | ICD-10-CM | POA: Diagnosis not present

## 2023-12-17 DIAGNOSIS — H35363 Drusen (degenerative) of macula, bilateral: Secondary | ICD-10-CM | POA: Diagnosis not present

## 2023-12-17 DIAGNOSIS — H35722 Serous detachment of retinal pigment epithelium, left eye: Secondary | ICD-10-CM | POA: Diagnosis not present

## 2023-12-18 ENCOUNTER — Ambulatory Visit (HOSPITAL_BASED_OUTPATIENT_CLINIC_OR_DEPARTMENT_OTHER)
Admission: RE | Admit: 2023-12-18 | Discharge: 2023-12-18 | Disposition: A | Discharge status: Home / Self Care | Source: Ambulatory Visit | Attending: Internal Medicine | Admitting: Internal Medicine

## 2023-12-18 DIAGNOSIS — R0982 Postnasal drip: Secondary | ICD-10-CM | POA: Diagnosis not present

## 2023-12-18 DIAGNOSIS — I6522 Occlusion and stenosis of left carotid artery: Secondary | ICD-10-CM | POA: Diagnosis not present

## 2023-12-18 DIAGNOSIS — J342 Deviated nasal septum: Secondary | ICD-10-CM | POA: Diagnosis not present

## 2023-12-18 DIAGNOSIS — R911 Solitary pulmonary nodule: Secondary | ICD-10-CM | POA: Diagnosis not present

## 2023-12-18 DIAGNOSIS — J84112 Idiopathic pulmonary fibrosis: Secondary | ICD-10-CM | POA: Diagnosis not present

## 2023-12-18 DIAGNOSIS — R058 Other specified cough: Secondary | ICD-10-CM | POA: Diagnosis present

## 2023-12-18 DIAGNOSIS — I700 Atherosclerosis of aorta: Secondary | ICD-10-CM | POA: Diagnosis not present

## 2023-12-18 DIAGNOSIS — J849 Interstitial pulmonary disease, unspecified: Secondary | ICD-10-CM | POA: Diagnosis not present

## 2023-12-18 DIAGNOSIS — J320 Chronic maxillary sinusitis: Secondary | ICD-10-CM | POA: Diagnosis not present

## 2023-12-27 NOTE — Progress Notes (Signed)
 Chronic sinusitis +. Plan refer ENT - I placed order. On CT Chest ILD is stable. RAST allergy panel neative

## 2023-12-28 DIAGNOSIS — R972 Elevated prostate specific antigen [PSA]: Secondary | ICD-10-CM | POA: Diagnosis not present

## 2023-12-28 NOTE — Progress Notes (Signed)
 Office Visit Note  Patient: Sean [name]             Date of Birth: 1951/02/10           MRN: [id number]             PCP: Kip Righter, MD Referring: Kip Righter, MD Visit Date: 01/11/2024   Subjective:  Medication Management (Patient has some testing being done on his prostate coming up in January. Patient mentions Raynauds issues with his hands increasing more recently.)   Discussed the use of AI scribe software for clinical note transcription with the patient, who gave verbal consent to proceed.  History of Present Illness   Sean [name] is a 73 y.o. 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 He presents with increased Raynaud's phenomenon and nocturnal coughing.  He has experienced a significant increase in Raynaud's phenomenon, with symptoms now triggered at temperatures as high as 50 degrees Fahrenheit, whereas previously, it required freezing temperatures. His fingers become symptomatic at 40 degrees, and he uses hand warmers to manage the symptoms. He is not currently on any medication specifically for Raynaud's, but he used to take amlodipine , which he stopped at an unspecified time. He is currently on metoprolol  for blood pressure. 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  He experiences nocturnal coughing that wakes him up at night, although the last couple of nights have been good. A CT scan of his lungs earlier this month showed scarring type changes around the edges of the lungs. He is scheduled to follow up with the pulmonary clinic in one to two weeks.  He has been taking Imuran , which was reduced to one tablet a day during his last visit. He reports regaining strength and gaining weight, noting a previous weight loss of over twenty pounds two years ago.  He has not been taking Lasix  recently, despite experiencing swelling, which he monitors using a ring gauge.       Previous  HPI 10/11/2023 Sean [name] is a 73 y.o. 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   He has a history of lung scarring, affecting less than ten percent of his total lung capacity. Pulmonary function tests indicate a ten to twenty percent restriction in lung volume compared to normal for his age and height. He has undergone multiple pulmonary function tests, with the next one scheduled in six months. There has been no significant change in his lung function over time, and he is not currently on any antifibrotic drugs.   He has a history of Raynaud's syndrome for at least twenty years. Cold weather exacerbates his symptoms, and he uses hand warmers to manage them. His hand swelling has improved, allowing him to wear rings more comfortably. He has not noticed any worsening of symptoms or signs of skin damage.   He feels strength is doing well with no particular complaints or limitations. He feels well overall and denies any recent illnesses.       Previous HPI 06/23/2023 Sean [name] is a 73 y.o. 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     He has been active and walking, feeling confident in his physical abilities. No recent illnesses have been noted. He experienced a mix-up with his pill container while traveling, leading to taking a different number of pills only on 100 mg of  azathioprine  on several days.   He experiences circulation issues in his hands, describing poor circulation. He previously used Cialis for this issue, which he found helpful, but has not taken it recently as he feels it is less necessary with warmer weather. 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   He experienced very dry hands and sinuses while at high altitudes during a recent trip to [place] , where he rented an RV and [place]iked [place]. He took only one dose of Lasix  in the past week and avoids taking it when  he has to drive in the morning.     Previous HPI 02/01/2023 Sean [name] is a 73 y.o. 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  His CK level was recently normalized in November with last follow-up with Dr. Geronimo. They have been off prednisone  for approximately ten days and have not noticed any problems since discontinuing the medication. They are currently on azathioprine , which they take three times a day.   The patient also reports experiencing Raynaud's syndrome, which they have had for a long time. They note that the symptoms have been worse recently, with numb fingers persisting even in warm conditions. They believe their blood pressure medication, a beta-blocker, is exacerbating these symptoms.   The patient also mentions some edema, which they monitor using a ring as a gauge. They had to take Lasix  for two days recently due to swelling. Despite these issues, the patient reports that they are able to do everything they want to do.      Previous HPI 10/14/2022 Sean [name] is a 73 y.o. 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  He has proceeded with gradually tapering down the prednisone  further and had visit with Dr. Geronimo earlier this month that looked pretty reassuring regarding ILD.  His repeat CK was also downtrending at 278.  He feels his strength has continued to improve with not having difficulties with any particular physical activities or movement anymore.  Leg swelling and hand swelling remains although less severe.  No new trouble with skin rashes, no coughing, no trouble swallowing.   Previous HPI 07/16/2022 Sean [name] is a 73 y.o. 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  Since her last visit he continues doing pretty well with gradual but consistent improvement in his strength and mobility.  He went on a  [place]iking cruise of the [place] and came back with mild upper respiratory viral illness which resolved without incident.  He had an appointment in pulmonology clinic with Dr. Brenna had chest CT ordered and referred to see Dr. Geronimo in ILD clinic.  Chest CT obtained on the 28th no report available yet.  He still noticing mild persistent swelling in his legs is not bothering him too much.  Raynaud's symptoms just with cold exposure and not causing any serious pain or long-lasting symptoms.     Previous HPI 06/03/22 Sean [name] is a 73 y.o. male here for follow up for autoimmune myositis currently on azathioprine  150 mg daily and prednisone  20 mg twice daily.  Since her last visit he is continue to stay active and working on his mobility with some additional partial improvement.  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  Leg swelling remains mostly improved.     Previous HPI 05/05/22 Sean [name] is a 73 y.o. male here for follow up for myositis currently on azathioprine  titrating dose now at 100 mg daily and prednisone  60 mg daily.  Since last visit he had EMG testing on March 14 with Dr. 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  He is tolerating the medication okay without major issue.  He is working with physical therapy focusing pretty extensively on his walking and also is doing a lot of shoulder mobility exercises.  He is seen partial improvement in his strength and stiffness with these exercises.  Most recently had a physical therapy session earlier today and has some muscle soreness.  He is also had more frequent nosebleeds reported particularly with bending forward position.  In the past week the frequency decreased he thinks this is from stopping sinus irrigation and applying Vaseline regularly.   Previous HPI 04/13/22 Sean [name]  is a 73 y.o. male here for follow up for myositis after initial visit almost 3 weeks ago he continues on prednisone  60 mg daily.  He started working with physical therapy and has noticed very mild improvements in strength for example is able to brace his foot up 1 step at a time unaided on the right side.  No interval worsening of strength elsewhere no swallowing or respiratory difficulties.  That follow-up with Dr. Kip last week he had repeat lab test with CK more elevated again to 4,387 up from 1,164 at his hospital discharge.  BNP was normal at 98. He was taking Lasix  at increased dose to daily for edema with some benefit but continues to have swelling in bilateral hands and legs.  Myositis antibody panel has resulted with positive for 52 kDa SSA antibody.  Muscle biopsy result was pretty nonspecific.   03/23/22 Muscle Biopsy A. MUSCLE, LEFT THIGH, BIOPSY:  - Mildly abnormal muscle showing regenerative fibrosis.  Presence of COX negative fibrous could be age-related, but the presence of regenerating fibers could be in agreement with the diagnosis of myositis; however, there are no inflammatory infiltrates.  Clinical correlation is suggested.   Previous HPI 03/26/22 Sean [name] is a 73 y.o. male here for myositis after recent hospital discharge.  He has experienced progressive weakening in proximal muscles as well as increased peripheral edema ongoing for months leading up to this evaluation.  Increase in his swelling and some dyspnea on exertion prompting cardiology evaluation in October and due to abnormal liver function test he was discontinued off of rosuvastatin  at that time.  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  Over the next few months until January he experienced ongoing unintentional weight loss down about 20 pounds and worsening exertion intolerance difficulty with  mobility and noticed progressive hunching over of his shoulders with height loss.  He saw gastroenterology due to the unintentional weight loss and abnormal LFTs with CT scan of the abdomen and pelvis with contrast was entirely normal besides prostate hypertrophy.  He eventually had updated echocardiogram and cardiac PET CT scan for evaluation showing normal function no abnormal metabolic uptake in the associated chest CT was negative for abnormal findings.  He saw Dr. Michel in pulmonology clinic due to his dyspnea despite normal cardiac workup where he  was noted to have some myalgias and proximal weakness as well as apparent Raynaud's symptoms with pallor in several digits prompted concern for connective tissue disease.  Laboratory testing showed highly elevated CK 6777 and aldolase 83.1 and highly positive ANA antibody titer 1:1280.  He was admitted to the hospital and started on high-dose steroids to expedite muscle biopsy with sampling from the left vastus lateralis collected on February 5. He is currently taking prednisone  60 mg daily with plan to taper down by 20 mg/day dose each month and also has plans to follow-up with Dr. Tobie for EMG study.  Remains quite weak compared to his baseline that he is able to walk without assistance just only limited to short distances.  He is noticing some dyspnea and nonproductive cough especially at nighttime when lying flat.  Denies any significant dysphagia.  He is not seeing any of the ongoing discoloration occurring in his fingers and toes.  No new skin rashes.   Review of Systems  Constitutional:  Negative for fatigue.  HENT:  Negative for mouth sores and mouth dryness.   Eyes:  Negative for dryness.  Respiratory:  Negative for shortness of breath.   Cardiovascular:  Negative for chest pain and palpitations.  Gastrointestinal:  Negative for blood in stool, constipation and diarrhea.  Endocrine: Negative for increased urination.  Genitourinary:  Negative  for involuntary urination.  Musculoskeletal:  Negative for joint pain, gait problem, joint pain, joint swelling, myalgias, muscle weakness, morning stiffness, muscle tenderness and myalgias.  Skin:  Negative for color change, rash, hair loss and sensitivity to sunlight.  Allergic/Immunologic: Negative for susceptible to infections.  Neurological:  Negative for dizziness and headaches.  Hematological:  Negative for swollen glands.  Psychiatric/Behavioral:  Positive for sleep disturbance. Negative for depressed mood. The patient is not nervous/anxious.     PMFS History:  Patient Active Problem List   Diagnosis Date Noted   Raynaud's syndrome without gangrene 01/11/2024   Aortic root dilatation    ILD (interstitial lung disease) (HCC) 08/07/2022   High risk medication use 05/05/2022   Swelling 04/13/2022   Long term (current) use of systemic steroids 03/26/2022   Pedal edema 03/26/2022   Myositis 03/18/2022   Alcohol  addiction (HCC) 03/18/2022   Hypothyroidism 03/18/2022   Elevated transaminase level 03/18/2022   RBBB 10/24/2015   Coronary artery disease    Tachycardia 05/09/2011   Abnormal liver enzymes 05/06/2011   HTN (hypertension) 04/29/2011   HLD (hyperlipidemia) 04/29/2011    Past Medical History:  Diagnosis Date   Alcohol  addiction (HCC)    2010- admitted for withdrawal , detox admission after that   Aortic root dilatation    38mm by echo 08/2022   Arrhythmia    Coronary artery disease    95% mid LAD s/p BMS of LAD with aneurysmal segment   Depression    ED (erectile dysfunction)    HLD (hyperlipidemia)    HTN (hypertension)    ILD (interstitial lung disease) (HCC)    Followed by Pulmonary   Mental disorder    Myositis    Peptic ulcer    RBBB 10/24/2015    Family History  Problem Relation Age of Onset   Prostate cancer Father    Heart attack Brother 10   Anesthesia problems Neg Hx    Hypotension Neg Hx    Malignant hyperthermia Neg Hx    Pseudochol  deficiency Neg Hx    Colon cancer Neg Hx    Stomach cancer Neg Hx  Esophageal cancer Neg Hx    Colon polyps Neg Hx    Past Surgical History:  Procedure Laterality Date   CORONARY STENT PLACEMENT     MUSCLE BIOPSY Left 03/23/2022   Procedure: LEFT THIGH MUSCLE BIOPSY;  Surgeon: Paola Dreama SAILOR, MD;  Location: [facility] OR;  Service: General;  Laterality: Left;   Social History   Social History Narrative   Live with wife in [place], is an art gallery manager, currently driving trucks for a local firm. Quit smoking 1990 after smoking for 20 years. Drinks heavilyu (1 bottle of vodka or 18-24 beers a day). Used to smoke marijuana but quit in 1990s. Has united healthcare insurance. Goes to [employer] meetings for alcohol  cessation   Immunization History  Administered Date(s) Administered   Fluad Quad(high Dose 65+) 10/20/2021   INFLUENZA, HIGH DOSE SEASONAL PF 11/06/2023   PFIZER(Purple Top)SARS-COV-2 Vaccination 04/01/2019   Pneumococcal Conjugate-13 10/05/2016   Pneumococcal Polysaccharide-23 03/11/2018     Objective: Vital Signs: BP 123/72   Pulse (!) 57   Temp (!) 96.9 F (36.1 C)   Resp 16   Ht 5' 10 (1.778 m)   Wt 219 lb 6.4 oz (99.5 kg)   BMI 31.48 kg/m    Physical Exam Eyes:     Conjunctiva/sclera: Conjunctivae normal.  Cardiovascular:     Rate and Rhythm: Normal rate and regular rhythm.  Pulmonary:     Effort: Pulmonary effort is normal.     Breath sounds: Normal breath sounds.  Lymphadenopathy:     Cervical: No cervical adenopathy.  Skin:    General: Skin is warm and dry.     Comments: Fingers cool to touch with slight edema or diffuse swelling only distal to MCP joints, no digital pitting  Neurological:     Mental Status: He is alert.  Psychiatric:        Mood and Affect: Mood normal.      Musculoskeletal Exam:  Shoulders full ROM no tenderness or swelling Elbows full ROM no tenderness or swelling Wrists full ROM no tenderness or swelling Fingers full ROM no tenderness  or swelling Knees full ROM no tenderness or swelling Ankles full ROM no tenderness or swelling  Investigation: No additional findings.  Imaging: CT MAXILLOFACIAL WO CONTRAST Result Date: 12/23/2023 EXAM: CT Sinus without contrast 12/18/2023 10:16:24 AM TECHNIQUE: CT sinuses without contrast. Dose reduction techniques were achieved by using automated exposure control and/or adjustment of mA and/or kV according to patient size and/or use of iterative reconstruction technique. COMPARISON: Prior head CT 04/29/2011. CLINICAL HISTORY: Nocturnal cough with sinus drainage. FINDINGS: FRONTAL: Clear. Patent frontoethmoidal recesses. MAXILLARY: Chronic left maxillary sinus mucoperiosteal thickening and moderate opacification, not significantly changed since 2013. Chronic left posterior maxillary dental changes including molar root canal, and adjacent molar extraction where the tooth extraction site appears to communicate with the alveolar recess of the sinus on series 9 image 75. No acute dental finding identified. The right maxillary sinus is largely clear. Bilateral ostiomeatal complexes remain patent. ETHMOID: Minimal ethmoid sinus mucosal thickening mostly on the left. Anterior ethmoidal artery position on coronal images 88 and 89 with pneumatization above both notches. Asymmetric olfactory fossa olfactory fossa is keros type 2. SPHENOID: Hyperplastic sphenoid sinus. Pneumatized bilateral anterior clinoid processes, greater on the left (series 8 image 69). Left posterior clinoid process also pneumatized (image 61). There is partial dehiscence of the left carotid canal on series 4 image 70. However, both sphenoid sinuses are well aerated. There is minor opacification and bubbly opacity in the  inferior extent of the right sphenoid toward the pterygoid plates (coronal image 68). No Onodi cell. NASAL CAVITY: Rightward nasal septal deviation and spurring. Nasal cavity mucosal pattern within normal limits. Olfactory  recesses are clear. OTHER: The mastoid air cells and middle ear cavities are well aerated. No acute soft tissue abnormality. Negative for age visible non-contrast brain parenchyma. Calcified atherosclerosis at the skull base. Bulky cervical carotid calcified atherosclerosis on the left. Other visible non-contrast deep soft tissue spaces of the face and neck are negative. Negative visible orbital and scalp soft tissues. Chronic cervical spine degeneration. IMPRESSION: 1. Chronic left maxillary sinusitis, appears likely to be odontogenic, and not significantly progressed since 2013. 2. Hyperoplastic paranasal sinuses are otherwise well aerated. Note pneumatized clinoid processes and possible dehiscence of the left carotid canal associated with sphenoid sinus hyperplasia. Electronically signed by: Helayne Hurst MD 12/23/2023 07:22 AM EST RP Workstation: [id number]   CT Chest High Resolution Result Date: 12/20/2023 CLINICAL DATA:  Diffuse/interstitial lung disease. EXAM: CT CHEST WITHOUT CONTRAST TECHNIQUE: Multidetector CT imaging of the chest was performed following the standard protocol without intravenous contrast. High resolution imaging of the lungs, as well as inspiratory and expiratory imaging, was performed. RADIATION DOSE REDUCTION: This exam was performed according to the departmental dose-optimization program which includes automated exposure control, adjustment of the mA and/or kV according to patient size and/or use of iterative reconstruction technique. COMPARISON:  07/06/2018 for. FINDINGS: Cardiovascular: Atherosclerotic calcification of the aorta, aortic valve and coronary arteries. Heart is at the upper limits of normal in size to mildly enlarged. No pericardial effusion. Mediastinum/Nodes: Mediastinal lymph nodes are not enlarged by CT size criteria and appear unchanged. No pathologically enlarged mediastinal or axillary lymph nodes. Hilar regions are difficult to definitively evaluate without IV  contrast. Thoracic inlet lymph nodes are not enlarged by CT size criteria. Air in the esophagus can be seen with dysmotility. Lungs/Pleura: Mild basilar and peripheral ground-glass minimal subpleural reticulation and trace bronchiolectasis, unchanged from 07/06/2022. 4 mm apical left upper lobe nodule, unchanged and considered benign. No pleural fluid. Airway is unremarkable. Expiratory phase imaging was not performed. Upper Abdomen: There may be gastric wall thickening. Visualized portions of the liver, gallbladder, adrenal glands, kidneys, spleen, pancreas, stomach and bowel are otherwise grossly unremarkable. No upper abdominal adenopathy. Musculoskeletal: Degenerative changes in the spine. Flowing anterior osteophytosis in the thoracic spine. IMPRESSION: 1. Mild peripheral and basilar predominant ground-glass, minimal subpleural reticulation and trace bronchiolectasis, unchanged from 07/06/2022. Findings are indeterminate for UIP per consensus guidelines: Diagnosis of Idiopathic Pulmonary Fibrosis: An Official ATS/ERS/JRS/ALAT Clinical Practice Guideline. Am JINNY [employer] Crit Care Med Vol 198, Iss 5, [phone], Oct 17 2016. 2. Possible gastric wall thickening. 3. Aortic atherosclerosis (ICD10-I70.0). Coronary artery calcification. Electronically Signed   By: Newell Eke M.D.   On: 12/20/2023 09:48    Recent Labs: Lab Results  Component Value Date   WBC 5.3 12/06/2023   HGB 14.2 12/06/2023   PLT 203.0 12/06/2023   NA 140 01/11/2024   K 4.4 01/11/2024   CL 103 01/11/2024   CO2 31 01/11/2024   GLUCOSE 106 (H) 01/11/2024   BUN 21 01/11/2024   CREATININE 0.96 01/11/2024   BILITOT 0.4 01/11/2024   ALKPHOS 63 01/05/2023   AST 20 01/11/2024   ALT 16 01/11/2024   PROT 6.2 01/11/2024   ALBUMIN 4.2 01/05/2023   CALCIUM  9.1 01/11/2024   GFRAA >90 05/04/2011    Speciality Comments: No specialty comments available.  Procedures:  No procedures performed Allergies: Patient  has no known allergies.    Assessment / Plan:     Visit Diagnoses: Other myositis of multiple sites - Plan: CK Muscle strength and movement satisfactory. Monitoring muscle enzyme levels for Imuran  therapy assessment. - Ordered blood test to measure creatine kinase levels. - Consider stopping Imuran  if creatine kinase levels are normal. - Continue Imuran  if creatine kinase levels are elevated.  High risk medication use - azathioprine  to 100 mg daily - Plan: Comprehensive metabolic panel with GFR No serious interval infections. - Checking CMP for medication monitoring on azathioprine   Raynaud's syndrome without gangrene Worsened with increased cold sensitivity. Amlodipine  discontinued due to leg swelling. Metoprolol  may worsen symptoms. - Try resuming amlodipine  at 2.5 mg daily. If causing increased peripheral edema we could switch to try nifedipine or try low dose PDE5.  Interstitial pulmonary disease with lung scarring CT shows scarring consistent with idiopathic pulmonary fibrosis. Myositis control should prevent active lung involvement if related.  Localized edema Edema possibly related to previous amlodipine  use. Monitoring necessary if amlodipine  is resumed. - Monitor for edema upon resuming amlodipine . - Use ring gauge to measure swelling.        Orders: Orders Placed This Encounter  Procedures   Comprehensive metabolic panel with GFR   CK   No orders of the defined types were placed in this encounter.    Follow-Up Instructions: Return in about 3 months (around 04/12/2024) for Myositis stop AZA f/u 3mos.   Lonni LELON Ester, MD  Note - This record has been created using [employer].  Chart creation errors have been sought, but may not always  have been located. Such creation errors do not reflect on  the standard of medical care.

## 2024-01-03 ENCOUNTER — Ambulatory Visit: Admitting: Primary Care

## 2024-01-03 ENCOUNTER — Encounter

## 2024-01-03 DIAGNOSIS — N5203 Combined arterial insufficiency and corporo-venous occlusive erectile dysfunction: Secondary | ICD-10-CM | POA: Diagnosis not present

## 2024-01-03 DIAGNOSIS — R972 Elevated prostate specific antigen [PSA]: Secondary | ICD-10-CM | POA: Diagnosis not present

## 2024-01-05 ENCOUNTER — Other Ambulatory Visit: Payer: Self-pay | Admitting: Urology

## 2024-01-05 DIAGNOSIS — R972 Elevated prostate specific antigen [PSA]: Secondary | ICD-10-CM

## 2024-01-11 ENCOUNTER — Ambulatory Visit: Attending: Internal Medicine | Admitting: Internal Medicine

## 2024-01-11 ENCOUNTER — Encounter: Payer: Self-pay | Admitting: Internal Medicine

## 2024-01-11 VITALS — BP 123/72 | HR 57 | Temp 96.9°F | Resp 16 | Ht 70.0 in | Wt 219.4 lb

## 2024-01-11 DIAGNOSIS — J849 Interstitial pulmonary disease, unspecified: Secondary | ICD-10-CM

## 2024-01-11 DIAGNOSIS — Z79899 Other long term (current) drug therapy: Secondary | ICD-10-CM

## 2024-01-11 DIAGNOSIS — I7300 Raynaud's syndrome without gangrene: Secondary | ICD-10-CM

## 2024-01-11 DIAGNOSIS — R600 Localized edema: Secondary | ICD-10-CM

## 2024-01-11 DIAGNOSIS — M6089 Other myositis, multiple sites: Secondary | ICD-10-CM

## 2024-01-11 LAB — COMPREHENSIVE METABOLIC PANEL WITH GFR
AG Ratio: 1.7 (calc) (ref 1.0–2.5)
ALT: 16 U/L (ref 9–46)
AST: 20 U/L (ref 10–35)
Albumin: 3.9 g/dL (ref 3.6–5.1)
Alkaline phosphatase (APISO): 73 U/L (ref 35–144)
BUN: 21 mg/dL (ref 7–25)
CO2: 31 mmol/L (ref 20–32)
Calcium: 9.1 mg/dL (ref 8.6–10.3)
Chloride: 103 mmol/L (ref 98–110)
Creat: 0.96 mg/dL (ref 0.70–1.28)
Globulin: 2.3 g/dL (ref 1.9–3.7)
Glucose, Bld: 106 mg/dL — ABNORMAL HIGH (ref 65–99)
Potassium: 4.4 mmol/L (ref 3.5–5.3)
Sodium: 140 mmol/L (ref 135–146)
Total Bilirubin: 0.4 mg/dL (ref 0.2–1.2)
Total Protein: 6.2 g/dL (ref 6.1–8.1)
eGFR: 83 mL/min/1.73m2 (ref >=60)

## 2024-01-11 LAB — CK: Total CK: 77 U/L (ref 19–278)

## 2024-01-12 ENCOUNTER — Encounter (INDEPENDENT_AMBULATORY_CARE_PROVIDER_SITE_OTHER): Payer: Self-pay

## 2024-01-25 ENCOUNTER — Ambulatory Visit

## 2024-01-25 ENCOUNTER — Encounter: Payer: Self-pay | Admitting: Primary Care

## 2024-01-25 ENCOUNTER — Ambulatory Visit: Admitting: Primary Care

## 2024-01-25 VITALS — BP 126/62 | HR 76 | Temp 97.4°F | Ht 69.0 in | Wt 213.0 lb

## 2024-01-25 DIAGNOSIS — R0982 Postnasal drip: Secondary | ICD-10-CM

## 2024-01-25 DIAGNOSIS — J849 Interstitial pulmonary disease, unspecified: Secondary | ICD-10-CM

## 2024-01-25 DIAGNOSIS — R053 Chronic cough: Secondary | ICD-10-CM | POA: Diagnosis not present

## 2024-01-25 DIAGNOSIS — M359 Systemic involvement of connective tissue, unspecified: Secondary | ICD-10-CM | POA: Diagnosis not present

## 2024-01-25 DIAGNOSIS — R058 Other specified cough: Secondary | ICD-10-CM

## 2024-01-25 LAB — PULMONARY FUNCTION TEST
DL/VA % pred: 103 %
DL/VA: 4.15 ml/min/mmHg/L
DLCO cor % pred: 87 %
DLCO cor: 21.61 ml/min/mmHg
DLCO unc % pred: 87 %
DLCO unc: 21.61 ml/min/mmHg
FEF 25-75 Pre: 2.45 L/s
FEF2575-%Pred-Pre: 109 %
FEV1-%Pred-Pre: 85 %
FEV1-Pre: 2.57 L
FEV1FVC-%Pred-Pre: 108 %
FEV6-%Pred-Pre: 82 %
FEV6-Pre: 3.21 L
FEV6FVC-%Pred-Pre: 105 %
FVC-%Pred-Pre: 78 %
FVC-Pre: 3.23 L
Pre FEV1/FVC ratio: 79 %
Pre FEV6/FVC Ratio: 99 %

## 2024-01-25 MED ORDER — AZELASTINE HCL 0.1 % NA SOLN: 2.0000 | Freq: Two times a day (BID) | NASAL | 12 refills | Status: AC

## 2024-01-25 MED ORDER — BENZONATATE 100 MG PO CAPS: 100.0000 mg | ORAL_CAPSULE | Freq: Two times a day (BID) | ORAL | 1 refills | Status: DC | PRN

## 2024-01-25 NOTE — Patient Instructions (Signed)
 Spirometry and diffusion capacity performed today.

## 2024-01-25 NOTE — Patient Instructions (Addendum)
  VISIT SUMMARY: You are a 73 year old male with a history of interstitial lung disease and myositis in remission. You came in for a follow-up visit to review your condition and recent diagnostic studies. Your lung function has shown improvement, and your cough has been better since starting a nasal spray. You also experience symptoms of Raynaud's syndrome, particularly numb fingers when handling cold objects.  YOUR PLAN: -INTERSTITIAL LUNG DISEASE: Interstitial lung disease involves scarring of the lung tissue, which can affect your breathing. Your recent CT scan shows minimal fibrosis with no significant progression, and your lung function has improved. We will continue to monitor your lung function with breathing tests every six months. If there is any progression of fibrosis, we may consider antifibrotic medication.  -CHRONIC COUGH: Your chronic cough, likely due to postnasal drip, has improved with the use of a nasal spray. The cough is mainly nocturnal and affects your sleep. We have refilled your Astelin  nasal spray and recommend trying Tessalon  Perles (benzonatate ) at bedtime to help with the cough. If Tessalon  Perles are not effective, we may consider gabapentin at bedtime.  INSTRUCTIONS: Please continue using the Astelin  nasal spray and start taking Tessalon  Perles at bedtime for your cough. We will monitor your lung function with breathing tests every six months. If your cough does not improve with Tessalon  Perles, contact us  to discuss the possibility of trying gabapentin at bedtime.  Follow-up 6 months with Dr. Geronimo- 30 min PFT prior

## 2024-01-25 NOTE — Progress Notes (Signed)
 Spirometry and diffusion capacity performed today.

## 2024-01-25 NOTE — Progress Notes (Signed)
 @Patient  ID: Alm KATHEE Freshwater, male    DOB: 1950-03-13, 73 y.o.   MRN: [id number]  Chief Complaint  Patient presents with   Interstitial Lung Disease    pft    Referring provider: Kip Righter, MD  HPI: 73 year old male, former smoker. PMH significant for ILD, connective tissue disease, myositis, raynaud's syndrome. Patient of Dr. Geronimo. Followig with rheumatology.   Previous [facility] pulmonary encounter 10/07/2023 -   Chief Complaint  Patient presents with   Medical Management of Chronic Issues   Interstitial Lung Disease    PFT done 09/27/23. Breathing is stable and no new respiratory co's.      HPI OZIE LUPE 72 y.o. - followup Interim Health status: No new complaints No new medical problems. No new surgeries. No ER visits. No Urgent care visits. No changes to medications. Per Him Dr Jeannetta is working dowin his immuran. REcent LFT normal. Rcent CK normal. PFT his month stable. Notice no CT chest in some years and was supposed to get CT this visit but do nto see one    OV 12/06/2023  Subjective:  Patient ID: Alm KATHEE Freshwater, male , DOB: January 30, 1951 , age 33 y.o. , MRN: [id number] , ADDRESS: [address] [place] [id number] PCP Kip Righter, MD Patient Care Team: Kip Righter, MD as PCP - General (Family Medicine) Shlomo Wilbert SAUNDERS, MD as PCP - Cardiology (Cardiology)  This Provider for this visit: Treatment Team:  Attending Provider: Geronimo Amel, MD    Follow-up very mild interstitial lung disease less than 10% burden in the setting of myositis and elevated CK  -Last CT May 2024  - Lst PFT Aug 2025 Myositis immunosuppressed with Imuran  and prednisone  follows with Dr. Lonni Jeannetta  - CK normal Aug 2025   Previous history of greater than 30 pack smoking    Coronary artery calcification seen on CAT scan Aortic valve calcification  - last echo dec 2023 - normal  - last Stress test 2014 and dec 2023 without ischemia  Plan  - per  cardiology    Alcohol  use High LFT history  - norma CT abd dec 2023 - normal LFT Aug 2024 and May 2025  Plan -per PCP Kip Righter, MD   12/06/2023 -   Chief Complaint  Patient presents with   Interstitial Lung Disease    Pt states since LOV breathing has been getting worse due to congestion and being woke up at 3am to cough phlegm up ( phlegm unknown) Prod cough Hasn't been taking anything congestion      HPI Alm KATHEE Winters 73 y.o. -acute visit. Says last few months afte the goes to bed he wakes up coughing. Is severe. Wife now sleeping in another room. When he is supine, he Is unable to sleep and wait a long time but it does help cough. IN lateral position, he is able to sleep easier but makes cough worse. Progressive. No wheezing. No day time cough. Has pain with coughin in Rt inflra clavicular area. Sinuss feels dry.   Symptoms score shows orsening cough. Not on ACE inhibitor Exercuse hypoxemia test is fine Remains on immuran        01/25/2024- interim hx  Discussed the use of AI scribe software for clinical note transcription with the patient, who gave verbal consent to proceed.  History of Present Illness OWAIN ECKERMAN is a 73 year old male with interstitial lung disease and myositis in remission who presents for a follow-up visit.  He has a history of interstitial lung disease secondary to connective tissue disease, specifically myositis, which is currently in remission. His CK levels, previously elevated to 600s, have normalized over the past two years. He was treated with azathioprine  for the last year and a half and prednisone  during hospitalization, which was tapered off as azathioprine  was increased. He notes significant improvement in his condition, including weight loss of 30 pounds, with a regain of 15 pounds, and a return of strength.  Recent diagnostic studies include a HRCT scan on November 1st, which showed minimal fibrosis in the peripheral lungs,  unchanged from May 2024. A breathing test showed improvement in lung function, with FEV1 increasing from 81% to 85% predicted. He experiences nocturnal coughing that wakes him up, which has recently improved. He uses Astelin  nasal spray for postnasal drip. The cough is rated as a 3 out of 5 in severity, particularly bothersome at night, affecting his wife's sleep. No significant coughing during the day. No shortness of breath at rest or with simple tasks such as changing clothes, showering, or doing chores. No issues with walking on level surfaces or inclines, noting significant improvement compared to two years ago when he had to stop while walking uphill.  He experiences symptoms of Raynaud's syndrome, particularly numb fingers when handling cold objects like shopping carts, even in mild temperatures.  No fatigue, nausea, vomiting, diarrhea, anxiety, depression, or chronic pain.   SYMPTOM SCALE - ILD 07/28/2022 09/29/2022  01/05/2023 Supportive care 10/07/2023  12/06/2023 Acute visit cough 01/25/2024  Current weight        O2 use ra ra ra ra  RA  Shortness of Breath 0 -> 5 scale with 5 being worst (score 6 If unable to do)  0     At rest 0 0 0 0 0 0  Simple tasks - showers, clothes change, eating, shaving 0 0 0 0 0 0  Household (dishes, doing bed, laundry) 0 0 00 0 0 0  Shopping 1 0 0 0 0 0  Walking level at own pace 1 1 1 1 1  0  Walking up Stairs 2 2 2 1 2  0  Total (30-36) Dyspnea Score 4 3 3 2 3  0  How bad is your cough? 1 -ACE inhibitor. 1 3 1 4 3   How bad is your fatigue 2 2 1  0 1 0  How bad is nausea 0 0 0 0 0 0  How bad is vomiting?  0 0 0 0 0 0  How bad is diarrhea? 2 1 1  00 1 0  How bad is anxiety? 1 1 0 0 0 0  How bad is depression 1 0 0 0 0 0  Any chronic pain - if so where and how bad x x  0 0 0      SIT STAND TEST - goal 15 times   12/06/2023  01/25/2024   O2 used ra RA  PRobe - finter or forehead finge Forehead   Number sit and stand completed - goal 15 15 15    Time taken to complete 49 sec   Resting Pulse Ox/HR/Dyspnea  99% and 72/min and dyspnea of 1/10  100% RA and 61 bpm   Peak measures 99 % and 101/min and dyspnea of 1/10 98% RA and 84 bpm  Final Pulse Ox/HR 97% and 77/min and dyspnea of 2/10 99% RA and 76 bpm  Desaturated </= 88% no no  Desaturated <= 3% points no no  Got Tachycardic >/= 90/min  yes no  Miscellaneous comments x x    Vitals:   01/25/24 1503 01/25/24 1515 01/25/24 1516 01/25/24 1517  BP: 126/62     Pulse: 61 61 84 76  Temp: (!) 97.4 F (36.3 C)     Height: 5' 9 (1.753 m)     Weight: 213 lb (96.6 kg)     SpO2: 99% Comment: ra 100% Comment: RA, RESTING 98% Comment: RA, EXERTION, DURING TEST 99% Comment: RA, RESTING, AFTER TEST  BMI (Calculated): 31.44        Pulmonary function testing 09/29/23 >> FVC 3.26 (74%), FEV1 2.58 (81%), ratio 79, DLCO 20.87 (81%) 01/25/2024 >> FVC 3.23 (87%), FEV1 2.57 (85%), ratio 79, DLCO 21.61 (87%)   No Known Allergies  Immunization History  Administered Date(s) Administered   Fluad Quad(high Dose 65+) 10/20/2021   INFLUENZA, HIGH DOSE SEASONAL PF 11/06/2023   PFIZER(Purple Top)SARS-COV-2 Vaccination 04/01/2019   Pneumococcal Conjugate-13 10/05/2016   Pneumococcal Polysaccharide-23 03/11/2018    Past Medical History:  Diagnosis Date   Alcohol  addiction (HCC)    2010- admitted for withdrawal , detox admission after that   Aortic root dilatation    38mm by echo 08/2022   Arrhythmia    Coronary artery disease    95% mid LAD s/p BMS of LAD with aneurysmal segment   Depression    ED (erectile dysfunction)    HLD (hyperlipidemia)    HTN (hypertension)    ILD (interstitial lung disease) (HCC)    Followed by Pulmonary   Mental disorder    Myositis    Peptic ulcer    RBBB 10/24/2015    Tobacco History: Social History   Tobacco Use  Smoking Status Former   Current packs/day: 0.00   Average packs/day: 1 pack/day for 30.0 years (30.0 ttl pk-yrs)   Types: Cigarettes    Start date: 40   Quit date: 68   Years since quitting: 35.9   Passive exposure: Past  Smokeless Tobacco Never   Counseling given: Not Answered   Outpatient Medications Prior to Visit  Medication Sig Dispense Refill   amLODipine  (NORVASC ) 2.5 MG tablet Take 1 tablet (2.5 mg total) by mouth daily. 90 tablet 1   azelastine  (ASTELIN ) 0.1 % nasal spray Place 2 sprays into both nostrils 2 (two) times daily. Use in each nostril as directed 30 mL 12   clopidogrel  (PLAVIX ) 75 MG tablet Take 1 tablet (75 mg total) by mouth daily. 90 tablet 3   metoprolol  succinate (TOPROL -XL) 50 MG 24 hr tablet Take one tablet by mouth every evening with or immediately following a meal. 90 tablet 1   Multiple Vitamin (MULITIVITAMIN WITH MINERALS) TABS Take 1 tablet by mouth daily.     pantoprazole  (PROTONIX ) 40 MG tablet Take 1 tablet (40 mg total) by mouth daily. 30 tablet 0   sildenafil (VIAGRA) 100 MG tablet Take 100 mg by mouth as needed for erectile dysfunction.     tadalafil (CIALIS) 5 MG tablet Take 2.5 mg by mouth daily as needed for erectile dysfunction.     ezetimibe  (ZETIA ) 10 MG tablet Take 1 tablet (10 mg total) by mouth daily. (Patient not taking: Reported on 01/25/2024) 90 tablet 3   nitroGLYCERIN  (NITROSTAT ) 0.4 MG SL tablet Place 1 tablet (0.4 mg total) under the tongue every 5 (five) minutes as needed for chest pain. (Patient not taking: Reported on 01/25/2024) 25 tablet 3   No facility-administered medications prior to visit.   Review of Systems  Review of Systems  Constitutional:  Negative.   Respiratory:  Positive for cough. Negative for chest tightness, shortness of breath and wheezing.    Physical Exam  BP 126/62   Pulse 76   Temp (!) 97.4 F (36.3 C)   Ht 5' 9 (1.753 m)   Wt 213 lb (96.6 kg)   SpO2 99% Comment: RA, RESTING, AFTER TEST  BMI 31.45 kg/m  Physical Exam Constitutional:      Appearance: Normal appearance.  HENT:     Head: Normocephalic and atraumatic.   Cardiovascular:     Rate and Rhythm: Normal rate and regular rhythm.  Pulmonary:     Effort: Pulmonary effort is normal.     Breath sounds: Normal breath sounds. No wheezing, rhonchi or rales.     Comments: Frequent throat clearing/dry cough Skin:    General: Skin is warm and dry.     Coloration: Skin is not jaundiced.  Neurological:     General: No focal deficit present.     Mental Status: He is alert and oriented to person, place, and time. Mental status is at baseline.  Psychiatric:        Mood and Affect: Mood normal.        Behavior: Behavior normal.        Thought Content: Thought content normal.        Judgment: Judgment normal.      Lab Results:  CBC    Component Value Date/Time   WBC 5.3 12/06/2023 1019   RBC 4.26 12/06/2023 1019   HGB 14.2 12/06/2023 1019   HCT 42.3 12/06/2023 1019   PLT 203.0 12/06/2023 1019   MCV 99.1 12/06/2023 1019   MCH 33.0 10/11/2023 1126   MCHC 33.6 12/06/2023 1019   RDW 13.5 12/06/2023 1019   LYMPHSABS 1.1 12/06/2023 1019   MONOABS 0.6 12/06/2023 1019   EOSABS 0.1 12/06/2023 1019   BASOSABS 0.0 12/06/2023 1019    BMET    Component Value Date/Time   NA 140 01/11/2024 1153   NA 139 12/23/2021 0840   K 4.4 01/11/2024 1153   CL 103 01/11/2024 1153   CO2 31 01/11/2024 1153   GLUCOSE 106 (H) 01/11/2024 1153   BUN 21 01/11/2024 1153   BUN 28 (H) 12/23/2021 0840   CREATININE 0.96 01/11/2024 1153   CALCIUM  9.1 01/11/2024 1153   GFRNONAA >60 03/23/2022 0346   GFRAA >90 05/04/2011 1312    BNP    Component Value Date/Time   BNP 121.0 (H) 03/20/2022 0609    ProBNP    Component Value Date/Time   PROBNP 98.0 04/08/2022 0953    Imaging: No results found.   Assessment & Plan:    1. ILD (interstitial lung disease) (HCC) (Primary)  Assessment and Plan Assessment & Plan Interstitial lung disease due to connective tissue disease  Mild fibrosis, less than 10% scarring on CT scan from June 2024. Recent CT scan from  November 1st, 2024, shows minimal fibrosis with no significant progression compared to May 2024. Lung function has improved since August 2024, with FEV1 at 85% predicted. No need for antifibrotic medication at this time due to lack of progression and potential side effects such as GI issues, cost, and poor tolerance. - Continue to monitor lung function with breathing tests every six months. - Will consider antifibrotic medication if there is progression of fibrosis. - FU in 6 months with Dr. Geronimo with 30 min PFT   Chronic cough Improved since starting Astelin  nasal spray, likely due to postnasal drip. Cough rated  3/5, primarily nocturnal, affecting sleep but not daytime activities. No other treatments tried for cough. During visit patient did have frequent throat clearing.  - Refilled Astelin  nasal spray. - Try Tessalon  Perles (benzonatate ) at bedtime for cough. - Consider gabapentin at bedtime if Tessalon  Perles are ineffective.  Almarie LELON Ferrari, NP 01/25/2024

## 2024-02-07 ENCOUNTER — Encounter (INDEPENDENT_AMBULATORY_CARE_PROVIDER_SITE_OTHER): Payer: Self-pay

## 2024-02-19 ENCOUNTER — Ambulatory Visit
Admission: RE | Admit: 2024-02-19 | Discharge: 2024-02-19 | Disposition: A | Source: Ambulatory Visit | Attending: Urology

## 2024-02-19 DIAGNOSIS — R972 Elevated prostate specific antigen [PSA]: Secondary | ICD-10-CM

## 2024-02-19 MED ORDER — GADOPICLENOL 0.5 MMOL/ML IV SOLN
10.0000 mL | Freq: Once | INTRAVENOUS | Status: AC | PRN
  Administered 2024-02-19: 10 mL via INTRAVENOUS

## 2024-02-26 ENCOUNTER — Other Ambulatory Visit: Payer: Self-pay | Admitting: Physician Assistant

## 2024-02-26 ENCOUNTER — Other Ambulatory Visit: Payer: Self-pay | Admitting: Primary Care

## 2024-03-09 ENCOUNTER — Telehealth (HOSPITAL_BASED_OUTPATIENT_CLINIC_OR_DEPARTMENT_OTHER): Payer: Self-pay | Admitting: *Deleted

## 2024-03-09 ENCOUNTER — Telehealth (HOSPITAL_BASED_OUTPATIENT_CLINIC_OR_DEPARTMENT_OTHER): Payer: Self-pay

## 2024-03-09 ENCOUNTER — Ambulatory Visit (INDEPENDENT_AMBULATORY_CARE_PROVIDER_SITE_OTHER)

## 2024-03-09 VITALS — BP 147/85 | HR 55

## 2024-03-09 DIAGNOSIS — J328 Other chronic sinusitis: Secondary | ICD-10-CM

## 2024-03-09 DIAGNOSIS — R053 Chronic cough: Secondary | ICD-10-CM | POA: Diagnosis not present

## 2024-03-09 NOTE — Patient Instructions (Signed)
Lloyd Huger Med Nasal Saline Rinse   - start nasal saline rinses with NeilMed Bottle available over the counter    Nasal Saline Irrigation instructions: If you choose to make your own salt water solution, You will need: Salt (kosher, canning, or pickling salt) Baking soda Nasal irrigation bottle (i.e. Lloyd Huger Med Sinus Rinse) Measuring spoon ( teaspoon) Distilled / boiled water   Mix solution Mix 1 teaspoon of salt, 1/2 teaspoon of baking soda and 1 cup of water into irrigation bottle ** May use saline packet instead of homemade recipe for this step if you prefer If medicine was prescribed to be mixed with solution, place this into bottle Examples 2 inches of 2% mupirocin ointment Budesonide solution Position your head: Lean over sink (about 45 degrees) Rotate head (about 45 degrees) so that one nostril is above the other Irrigate Insert tip of irrigation bottle into upper nostril so it forms a comfortable seal Irrigate while breathing through your mouth May remove the straw from the bottle in order to irrigate the entire solution (important if medicine was added) Exhale through nose when finished and blow nose as necessary  Repeat on opposite side with other 1/2 of solution (120 mL) or remake solution if all 240 mL was used on first side Wash irrigation bottle regularly, replace every 3 months

## 2024-03-09 NOTE — Telephone Encounter (Signed)
"  ° °  Pre-operative Risk Assessment    Patient Name: Sean [name]  DOB: 1950/12/30 MRN: [id number]   Date of last office visit: 09/07/2023 Orren Fabry, PA-C Date of next office visit: None  Request for Surgical Clearance    Procedure:  Prostate biopsy  Date of Surgery:  Clearance TBD                                 Surgeon:  Dr. Steffan Pea  Surgeon's Group or Practice Name:  [facility]  Phone number:  [phone] Fax number:  [phone]   Type of Clearance Requested:   - Medical  - Pharmacy:  Hold Clopidogrel  (Plavix ) -needs instruction   Type of Anesthesia:  Does not specify   Additional requests/questions:  None  Signed, Patrcia Iverson CROME   03/09/2024, 2:30 PM   "

## 2024-03-09 NOTE — Progress Notes (Signed)
 Dear Dr. Kip, Here is my assessment for our mutual patient, Sean [name]. Thank you for allowing me the opportunity to care for your patient. Please do not hesitate to contact me should you have any other questions. Sincerely, Dr. Hadassah Parody  Otolaryngology Clinic Note Referring provider: Dr. Kip HPI:   Initial HPI (03/09/2024)  74 year old male who presents for evaluation of chronic sinusitis and nocturnal cough.  His main concern today is nocturnal cough.  Persistent for approximately 1 year.  Occurs exclusively at night.  Does not occur when supine but occurs when he lies on that side.  Very disruptive to him and his wife.  Cough wakes him up from sleep.  No daytime cough. Takes pantoprazole  daily in the morning.  Feels like his heartburn is well-controlled.  Regarding his sinus symptoms, he had a sinus CT which showed left maxillary sinusitis likely odontogenic in origin.  Reports he had some dental work done many years ago on the left maxillary dentition and a bridge was placed.  He does not suffer from any chronic sinus symptoms.  No facial pain.  No dental pain.  Unsure as if he having any postnasal drainage.   Independent Review of Additional Tests or Records:  Referral note 12/27/2023 Sean [name]: Chronic sinusitis, refer to ENT  CT face 12/18/2023 independently reviewed showing left maxillary sinusitis likely odontogenic given that tooth is erupting through the sinus floor, bilateral indeterminate hypertrophy, rightward nasal septal deviation with spur, right concha bullosa    PMH/Meds/All/SocHx/FamHx/ROS:   Past Medical History:  Diagnosis Date   Alcohol  addiction (HCC)    2010- admitted for withdrawal , detox admission after that   Aortic root dilatation    38mm by echo 08/2022   Arrhythmia    Coronary artery disease    95% mid LAD s/p BMS of LAD with aneurysmal segment   Depression    ED (erectile dysfunction)    HLD (hyperlipidemia)    HTN  (hypertension)    ILD (interstitial lung disease) (HCC)    Followed by Pulmonary   Mental disorder    Myositis    Peptic ulcer    RBBB 10/24/2015     Past Surgical History:  Procedure Laterality Date   CORONARY STENT PLACEMENT     MUSCLE BIOPSY Left 03/23/2022   Procedure: LEFT THIGH MUSCLE BIOPSY;  Surgeon: Paola Dreama SAILOR, MD;  Location: [facility] OR;  Service: General;  Laterality: Left;    Family History  Problem Relation Age of Onset   Prostate cancer Father    Heart attack Brother 74   Anesthesia problems Neg Hx    Hypotension Neg Hx    Malignant hyperthermia Neg Hx    Pseudochol deficiency Neg Hx    Colon cancer Neg Hx    Stomach cancer Neg Hx    Esophageal cancer Neg Hx    Colon polyps Neg Hx      Social Connections: Not on file     Current Outpatient Medications  Medication Instructions   amLODipine  (NORVASC ) 2.5 mg, Oral, Daily   azelastine  (ASTELIN ) 0.1 % nasal spray 2 sprays, Each Nare, 2 times daily, Use in each nostril as directed   benzonatate  (TESSALON ) 100 mg, Oral, 2 times daily PRN   clopidogrel  (PLAVIX ) 75 mg, Oral, Daily   ezetimibe  (ZETIA ) 10 mg, Oral, Daily   metoprolol  succinate (TOPROL -XL) 50 MG 24 hr tablet Take one tablet by mouth every evening with or immediately following a meal.   Multiple Vitamin (MULITIVITAMIN WITH MINERALS) TABS  1 tablet, Daily   nitroGLYCERIN  (NITROSTAT ) 0.4 mg, Sublingual, Every 5 min PRN   pantoprazole  (PROTONIX ) 40 mg, Oral, Daily   sildenafil (VIAGRA) 100 mg, As needed   tadalafil (CIALIS) 2.5 mg, Daily PRN     Physical Exam:   BP (!) 147/85 (BP Location: Right Arm, Patient Position: Sitting)   Pulse (!) 55   SpO2 96%   Salient findings:  CN II-XII intact    Bilateral EAC clear and TM intact with well pneumatized middle ear spaces  Anterior rhinoscopy: Septum midline anteriorly; bilateral inferior turbinates with hypertrophy Nasal endoscopy was indicated to better evaluate the nose and paranasal sinuses, given  the patient's history and exam findings, and is detailed below.  No lesions of oral cavity/oropharynx; dentition fair   No obviously palpable neck masses/lymphadenopathy/thyromegaly  No respiratory distress or stridor TFL was indicated to better evaluate the proximal airway, given the patient's history and exam findings, and is detailed below.   Seprately Identifiable Procedures:  Prior to initiating any procedures, risks/benefits/alternatives were explained to the patient and verbal consent obtained.  PROCEDURE (03/09/2024): Bilateral Diagnostic Rigid Nasal Endoscopy Pre-procedure diagnosis: Concern for chronic sinusitis Post-procedure diagnosis: same Indication: See pre-procedure diagnosis and physical exam above Complications: None apparent EBL: 0 mL Anesthesia: Lidocaine  4% and topical decongestant was topically sprayed in each nasal cavity  Description of Procedure:  Patient was identified. A rigid 30 degree endoscope was utilized to evaluate the sinonasal cavities, mucosa, sinus ostia and turbinates and septum.  Overall, signs of mucosal inflammation are noted.  Also noted are rightward nasal septal deviation.  No mucopurulence, polyps, or masses noted.   Right Middle meatus: Clear Right SE Recess: Clear Left MM: Mucoid drainage present and draining into nasal cavity into the pharynx Left SE Recess: Clear Photodocumentation was obtained.  CPT CODE -- 68768 - Mod 25  Procedure Note (03/09/2024) Pre-procedure diagnosis: Chronic cough Post-procedure diagnosis: Same Procedure: Transnasal Fiberoptic Laryngoscopy, CPT 31575 - Mod 25 Indication: Chronic cough Complications: None apparent EBL: 0 mL  The procedure was undertaken to further evaluate the patient's complaint of chronic cough, with mirror exam inadequate for appropriate examination due to gag reflex and poor patient tolerance  Procedure:  Patient was identified as correct patient. Verbal consent was obtained. The nose  was sprayed with oxymetazoline and 4% lidocaine . The The flexible laryngoscope was passed through the nose to view the nasal cavity, pharynx (oropharynx, hypopharynx) and larynx.  The larynx was examined at rest and during multiple phonatory tasks. Documentation was obtained and reviewed with patient. The scope was removed. The patient tolerated the procedure well.  Findings: The nasal cavity and nasopharynx did not reveal any masses or lesions, mucosa appeared to be without obvious lesions. The tongue base, pharyngeal walls, piriform sinuses, vallecula, epiglottis and postcricoid region are normal in appearance. The visualized portion of the subglottis and proximal trachea is widely patent. The vocal folds are mobile bilaterally. There are no lesions on the free edge of the vocal folds nor elsewhere in the larynx worrisome for malignancy.    Electronically signed by: Hadassah JAYSON Parody, MD 03/09/2024 8:26 PM    Impression & Plans:  Sean [name] is a 74 y.o. male with     ICD-10-CM   1. Chronic cough  R05.3     2. Other chronic sinusitis  J32.8        Assessment and Plan Assessment & Plan Chronic nocturnal cough Chronic, positional nocturnal cough likely multifactorial, with contributions from postnasal drainage due to  chronic left maxillary sinusitis and suspected silent gastroesophageal reflux. Laryngeal examination was normal. No evidence of acute airway compromise or daytime symptoms. GERD is suspected to be a significant contributor, as nocturnal reflux can cause laryngeal irritation and positional symptoms even without heartburn. Postnasal drainage from left maxillary sinusitis may also contribute, supported by endoscopic findings of mucus draining from the left maxillary sinus. - Performed flexible nasolaryngoscopy to evaluate larynx and nasal cavities. - Recommended nightly sinus irrigation with distilled water and saline packets to reduce postnasal drainage. - Advised changing  pantoprazole  dosing to nighttime to better control nocturnal reflux. - Advised waiting at least two hours after eating before going to bed to minimize reflux risk. - Scheduled follow-up in several months to reassess symptoms.  Chronic left maxillary sinusitis likely secondary to dental disease Chronic left maxillary sinusitis with significant drainage from the left maxillary sinus ostium, likely odontogenic.  Imaging and endoscopy confirm chronic inflammation and mucus accumulation. He is currently asymptomatic regarding facial pain and nasal obstruction. Invasive intervention deferred due to lack of significant symptoms and patient preference. Definitive management would require dental intervention and/or sinus surgery if symptoms worsen, but this is not indicated at present. - Advised regular dental follow-up to monitor dental health. - Recommended nightly sinus irrigation to help clear mucus and reduce postnasal drainage. - Discussed that definitive management may require dental intervention and/or sinus surgery if symptoms worsen, but deferred at this time.   See below regarding exact medications prescribed this encounter including dosages and route: No orders of the defined types were placed in this encounter.     Thank you for allowing me the opportunity to care for your patient. Please do not hesitate to contact me should you have any other questions.  Sincerely, Hadassah Parody, MD Otolaryngologist (ENT), [facility] Phone: [phone] Fax: [phone]  MDM:  Level 4 Complexity/Problems addressed: 4-2 chronic problems Data complexity: 4-  independent review of CT scan - Morbidity: -   - Prescription Drug prescribed or managed:

## 2024-03-09 NOTE — Telephone Encounter (Signed)
 Pt has been scheduled tele preop appt 03/10/24, as he states procedure will not be scheduled until he has been cleared. Med rec and consent are done.      Patient Consent for Virtual Visit        CHRISHAUN SASSO has provided verbal consent on 03/09/2024 for a virtual visit (video or telephone).   CONSENT FOR VIRTUAL VISIT FOR:  Alm KATHEE Freshwater  By participating in this virtual visit I agree to the following:  I hereby voluntarily request, consent and authorize [facility] and its employed or contracted physicians, physician assistants, nurse practitioners or other licensed health care professionals (the Practitioner), to provide me with telemedicine health care services (the Services) as deemed necessary by the treating Practitioner. I acknowledge and consent to receive the Services by the Practitioner via telemedicine. I understand that the telemedicine visit will involve communicating with the Practitioner through live audiovisual communication technology and the disclosure of certain medical information by electronic transmission. I acknowledge that I have been given the opportunity to request an in-person assessment or other available alternative prior to the telemedicine visit and am voluntarily participating in the telemedicine visit.  I understand that I have the right to withhold or withdraw my consent to the use of telemedicine in the course of my care at any time, without affecting my right to future care or treatment, and that the Practitioner or I may terminate the telemedicine visit at any time. I understand that I have the right to inspect all information obtained and/or recorded in the course of the telemedicine visit and may receive copies of available information for a reasonable fee.  I understand that some of the potential risks of receiving the Services via telemedicine include:  Delay or interruption in medical evaluation due to technological equipment failure or  disruption; Information transmitted may not be sufficient (e.g. poor resolution of images) to allow for appropriate medical decision making by the Practitioner; and/or  In rare instances, security protocols could fail, causing a breach of personal health information.  Furthermore, I acknowledge that it is my responsibility to provide information about my medical history, conditions and care that is complete and accurate to the best of my ability. I acknowledge that Practitioner's advice, recommendations, and/or decision may be based on factors not within their control, such as incomplete or inaccurate data provided by me or distortions of diagnostic images or specimens that may result from electronic transmissions. I understand that the practice of medicine is not an exact science and that Practitioner makes no warranties or guarantees regarding treatment outcomes. I acknowledge that a copy of this consent can be made available to me via my patient portal [facility] MyChart), or I can request a printed copy by calling the office of [facility].    I understand that my insurance will be billed for this visit.   I have read or had this consent read to me. I understand the contents of this consent, which adequately explains the benefits and risks of the Services being provided via telemedicine.  I have been provided ample opportunity to ask questions regarding this consent and the Services and have had my questions answered to my satisfaction. I give my informed consent for the services to be provided through the use of telemedicine in my medical care

## 2024-03-09 NOTE — Telephone Encounter (Signed)
" ° °  Name: Sean [name]  DOB: Apr 27, 1950  MRN: [id number]  Primary Cardiologist: Wilbert Bihari, MD   Preoperative team, please contact this patient and set up a phone call appointment for further preoperative risk assessment. Please obtain consent and complete medication review. Thank you for your help.  I confirm that guidance regarding antiplatelet and oral anticoagulation therapy has been completed and, if necessary, noted below.  Per office protocol, he may hold Plavix  for 5 days prior to procedure and should resume as soon as hemodynamically stable postoperatively.  Patient is to take aspirin  81 mg daily throughout perioperative period.  He may discontinue aspirin  when he resumes Plavix .   I also confirmed the patient resides in the state of [place] . As per [facility] telemedicine laws, the patient must reside in the state in which the provider is licensed.   Mardy KATHEE Pizza, FNP 03/09/2024, 3:51 PM [facility]    "

## 2024-03-09 NOTE — Telephone Encounter (Signed)
 Pt has been scheduled tele preop appt 03/10/24, as he states procedure will not be scheduled until he has been cleared. Med rec and consent are done.

## 2024-03-10 ENCOUNTER — Encounter (INDEPENDENT_AMBULATORY_CARE_PROVIDER_SITE_OTHER): Payer: Self-pay

## 2024-03-10 ENCOUNTER — Ambulatory Visit: Attending: Cardiovascular Disease

## 2024-03-10 DIAGNOSIS — Z01818 Encounter for other preprocedural examination: Secondary | ICD-10-CM

## 2024-03-10 DIAGNOSIS — Z01810 Encounter for preprocedural cardiovascular examination: Secondary | ICD-10-CM

## 2024-03-10 NOTE — Progress Notes (Signed)
 "   Virtual Visit via Telephone Note   Because of Sean [name], he is at least at moderate risk for complications without adequate follow up.  This format is felt to be most appropriate for this patient at this time.  Due to technical limitations with video connection (technology), today's appointment will be conducted as an audio only telehealth visit, and Sean [name] verbally agreed to proceed in this manner.   All issues noted in this document were discussed and addressed.  No physical exam could be performed with this format.  Evaluation Performed:  Preoperative cardiovascular risk assessment _____________   Date:  03/10/2024   Patient ID:  Sean [name], DOB 1950-05-26, MRN [id number] Patient Location:  Home Provider location:   Office  Primary Care Provider:  Kip Righter, MD Primary Cardiologist:  Sean Bihari, MD  Chief Complaint / Patient Profile   74 y.o. y/o [name] with a h/o CAD s/p BMS, dyslipidemia, PAD followed by pulmonary, myositis, and hypertension who is pending prostate biopsy and presents today for telephonic preoperative cardiovascular risk assessment.  History of Present Illness    Sean [name] is a 74 y.o. [name] who presents via audio/video conferencing for a telehealth visit today.  Pt was last seen in cardiology clinic on 09/07/23 by Sean Fabry, PA-C.  At that time Sean [name] was doing well.  The patient is now pending procedure as outlined above. Since his last visit, he walks his two boxers a mile daily. He also drives buses.   He denies chest pain, shortness of breath, lower extremity edema, fatigue, palpitations, melena, hematuria, hemoptysis, diaphoresis, weakness, presyncope, syncope, orthopnea, and PND.  Past Medical History    Past Medical History:  Diagnosis Date   Alcohol  addiction (HCC)    2010- admitted for withdrawal , detox admission after that   Aortic root dilatation    38mm by echo 08/2022   Arrhythmia    Coronary artery  disease    95% mid LAD s/p BMS of LAD with aneurysmal segment   Depression    ED (erectile dysfunction)    HLD (hyperlipidemia)    HTN (hypertension)    ILD (interstitial lung disease) (HCC)    Followed by Pulmonary   Mental disorder    Myositis    Peptic ulcer    RBBB 10/24/2015   Past Surgical History:  Procedure Laterality Date   CORONARY STENT PLACEMENT     MUSCLE BIOPSY Left 03/23/2022   Procedure: LEFT THIGH MUSCLE BIOPSY;  Surgeon: Sean Dreama SAILOR, MD;  Location: [facility] OR;  Service: General;  Laterality: Left;    Allergies  Allergies[1]  Home Medications    Prior to Admission medications  Medication Sig Start Date End Date Taking? Authorizing Provider  amLODipine  (NORVASC ) 2.5 MG tablet TAKE ONE TABLET BY MOUTH ONCE A DAY 02/28/24   [name] Sean SAUNDERS, MD  azelastine  (ASTELIN ) 0.1 % nasal spray Place 2 sprays into both nostrils 2 (two) times daily. Use in each nostril as directed 01/25/24   Hope Almarie ORN, NP  benzonatate  (TESSALON ) 100 MG capsule Take 1 capsule (100 mg total) by mouth 2 (two) times daily as needed for cough. 02/29/24   Hope Almarie ORN, NP  clopidogrel  (PLAVIX ) 75 MG tablet Take 1 tablet (75 mg total) by mouth daily. 09/07/23   [name] Sean SAILOR, PA-C  ezetimibe  (ZETIA ) 10 MG tablet Take 1 tablet (10 mg total) by mouth daily. Patient not taking: Reported on 03/09/2024 09/07/23 12/06/23  Lucien Sean SAILOR, PA-C  metoprolol  succinate (TOPROL -XL) 50 MG 24 hr tablet Take one tablet by mouth every evening with or immediately following a meal. 09/07/23   Lucien Sean SAILOR, PA-C  Multiple Vitamin (MULITIVITAMIN WITH MINERALS) TABS Take 1 tablet by mouth daily.    [provider]  nitroGLYCERIN  (NITROSTAT ) 0.4 MG SL tablet Place 1 tablet (0.4 mg total) under the tongue every 5 (five) minutes as needed for chest pain. 09/07/23   Lucien Sean SAILOR, PA-C  pantoprazole  (PROTONIX ) 40 MG tablet Take 1 tablet (40 mg total) by mouth daily. 03/23/22 03/09/24  Ezenduka, Nkeiruka J, MD   sildenafil (VIAGRA) 100 MG tablet Take 100 mg by mouth as needed for erectile dysfunction.    [provider]  tadalafil (CIALIS) 5 MG tablet Take 2.5 mg by mouth daily as needed for erectile dysfunction.    [provider]    Physical Exam    Vital Signs:  Sean [name] does not have vital signs available for review today.  Given telephonic nature of communication, physical exam is limited. AAOx3. NAD. Normal affect.  Speech and respirations are unlabored.  Accessory Clinical Findings    None  Assessment & Plan    1.  Preoperative Cardiovascular Risk Assessment:  According to the Revised Cardiac Risk Index (RCRI), his Perioperative Risk of Major Cardiac Event is (%): 0.9 His Functional Capacity in METs is: 4.64 according to the Duke Activity Status Index (DASI). Per AHA/ACC guidelines, he is deemed acceptable risk for the planned procedure without additional cardiovascular testing. Will route to surgical team so they are aware.   The patient was advised that if he develops new symptoms prior to surgery to contact our office to arrange for a follow-up visit, and he verbalized understanding.  Per office protocol, he may hold Plavix  for 5 days prior to procedure and should resume as soon as hemodynamically stable postoperatively. Patient is to take aspirin  81 mg daily throughout perioperative period. He may discontinue aspirin  when he resumes Plavix .   A copy of this note will be routed to requesting surgeon.  Time:   Today, I have spent 10 minutes with the patient with telehealth technology discussing medical history, symptoms, and management plan.     Mardy KATHEE Pizza, FNP  03/10/2024, 2:23 PM     [1] No Known Allergies  "

## 2024-04-12 ENCOUNTER — Ambulatory Visit: Admitting: Internal Medicine

## 2024-05-10 ENCOUNTER — Ambulatory Visit (INDEPENDENT_AMBULATORY_CARE_PROVIDER_SITE_OTHER)

## 2024-08-01 ENCOUNTER — Encounter

## 2024-08-01 ENCOUNTER — Ambulatory Visit: Admitting: Internal Medicine
